# Patient Record
Sex: Female | Born: 1960 | Race: White | Hispanic: No | Marital: Married | Smoking: Never smoker
Health system: Southern US, Community
[De-identification: ages and names within clinical notes are randomized; demographics above are authoritative.]

## PROBLEM LIST (undated history)

## (undated) DIAGNOSIS — Z9889 Other specified postprocedural states: Secondary | ICD-10-CM

## (undated) DIAGNOSIS — K900 Celiac disease: Secondary | ICD-10-CM

## (undated) DIAGNOSIS — Z86711 Personal history of pulmonary embolism: Secondary | ICD-10-CM

## (undated) DIAGNOSIS — D47Z9 Other specified neoplasms of uncertain behavior of lymphoid, hematopoietic and related tissue: Secondary | ICD-10-CM

## (undated) DIAGNOSIS — K635 Polyp of colon: Secondary | ICD-10-CM

## (undated) DIAGNOSIS — K317 Polyp of stomach and duodenum: Secondary | ICD-10-CM

## (undated) DIAGNOSIS — T7840XA Allergy, unspecified, initial encounter: Secondary | ICD-10-CM

## (undated) DIAGNOSIS — J302 Other seasonal allergic rhinitis: Secondary | ICD-10-CM

## (undated) DIAGNOSIS — R0600 Dyspnea, unspecified: Secondary | ICD-10-CM

## (undated) DIAGNOSIS — C439 Malignant melanoma of skin, unspecified: Secondary | ICD-10-CM

## (undated) DIAGNOSIS — Z87442 Personal history of urinary calculi: Secondary | ICD-10-CM

## (undated) DIAGNOSIS — D509 Iron deficiency anemia, unspecified: Secondary | ICD-10-CM

## (undated) DIAGNOSIS — J45909 Unspecified asthma, uncomplicated: Secondary | ICD-10-CM

## (undated) DIAGNOSIS — K219 Gastro-esophageal reflux disease without esophagitis: Secondary | ICD-10-CM

## (undated) DIAGNOSIS — R519 Headache, unspecified: Secondary | ICD-10-CM

## (undated) DIAGNOSIS — I499 Cardiac arrhythmia, unspecified: Secondary | ICD-10-CM

## (undated) DIAGNOSIS — J189 Pneumonia, unspecified organism: Secondary | ICD-10-CM

## (undated) DIAGNOSIS — K828 Other specified diseases of gallbladder: Secondary | ICD-10-CM

## (undated) DIAGNOSIS — M8580 Other specified disorders of bone density and structure, unspecified site: Secondary | ICD-10-CM

## (undated) DIAGNOSIS — H269 Unspecified cataract: Secondary | ICD-10-CM

## (undated) DIAGNOSIS — R112 Nausea with vomiting, unspecified: Secondary | ICD-10-CM

## (undated) HISTORY — DX: Malignant melanoma of skin, unspecified: C43.9

## (undated) HISTORY — DX: Other specified diseases of gallbladder: K82.8

## (undated) HISTORY — PX: OTHER SURGICAL HISTORY: SHX169

## (undated) HISTORY — DX: Iron deficiency anemia, unspecified: D50.9

## (undated) HISTORY — DX: Gastro-esophageal reflux disease without esophagitis: K21.9

## (undated) HISTORY — DX: Polyp of colon: K63.5

## (undated) HISTORY — DX: Polyp of stomach and duodenum: K31.7

## (undated) HISTORY — DX: Other specified neoplasms of uncertain behavior of lymphoid, hematopoietic and related tissue: D47.Z9

## (undated) HISTORY — PX: UMBILICAL HERNIA REPAIR: SHX196

## (undated) HISTORY — PX: CATARACT EXTRACTION, BILATERAL: SHX1313

## (undated) HISTORY — DX: Unspecified cataract: H26.9

## (undated) HISTORY — PX: MOHS SURGERY: SUR867

## (undated) HISTORY — DX: Personal history of pulmonary embolism: Z86.711

## (undated) HISTORY — PX: PELVIC LAPAROSCOPY: SHX162

## (undated) HISTORY — DX: Allergy, unspecified, initial encounter: T78.40XA

## (undated) HISTORY — PX: KNEE SURGERY: SHX244

## (undated) HISTORY — PX: EYE SURGERY: SHX253

## (undated) HISTORY — DX: Other seasonal allergic rhinitis: J30.2

## (undated) HISTORY — DX: Celiac disease: K90.0

## (undated) HISTORY — PX: HAND SURGERY: SHX662

## (undated) HISTORY — DX: Other specified disorders of bone density and structure, unspecified site: M85.80

---

## 1991-02-03 HISTORY — PX: ABDOMINAL SURGERY: SHX537

## 1991-02-03 HISTORY — PX: OVARIAN CYST REMOVAL: SHX89

## 1998-03-20 ENCOUNTER — Other Ambulatory Visit: Admission: RE | Admit: 1998-03-20 | Discharge: 1998-03-20 | Payer: Self-pay | Admitting: Gynecology

## 1999-03-09 ENCOUNTER — Ambulatory Visit (HOSPITAL_COMMUNITY): Admission: RE | Admit: 1999-03-09 | Discharge: 1999-03-09 | Payer: Self-pay | Admitting: Neurosurgery

## 1999-03-09 ENCOUNTER — Encounter: Payer: Self-pay | Admitting: Neurosurgery

## 1999-06-02 ENCOUNTER — Other Ambulatory Visit: Admission: RE | Admit: 1999-06-02 | Discharge: 1999-06-02 | Payer: Self-pay | Admitting: Gynecology

## 1999-12-01 ENCOUNTER — Ambulatory Visit (HOSPITAL_COMMUNITY): Admission: RE | Admit: 1999-12-01 | Discharge: 1999-12-01 | Payer: Self-pay | Admitting: Internal Medicine

## 1999-12-08 ENCOUNTER — Ambulatory Visit (HOSPITAL_COMMUNITY): Admission: RE | Admit: 1999-12-08 | Discharge: 1999-12-08 | Payer: Self-pay | Admitting: Internal Medicine

## 1999-12-29 ENCOUNTER — Encounter: Payer: Self-pay | Admitting: Internal Medicine

## 1999-12-29 ENCOUNTER — Ambulatory Visit (HOSPITAL_COMMUNITY): Admission: RE | Admit: 1999-12-29 | Discharge: 1999-12-29 | Payer: Self-pay | Admitting: Internal Medicine

## 2000-06-08 ENCOUNTER — Other Ambulatory Visit: Admission: RE | Admit: 2000-06-08 | Discharge: 2000-06-08 | Payer: Self-pay | Admitting: Gynecology

## 2001-06-06 ENCOUNTER — Other Ambulatory Visit: Admission: RE | Admit: 2001-06-06 | Discharge: 2001-06-06 | Payer: Self-pay | Admitting: Gynecology

## 2002-07-04 ENCOUNTER — Other Ambulatory Visit: Admission: RE | Admit: 2002-07-04 | Discharge: 2002-07-04 | Payer: Self-pay | Admitting: Gynecology

## 2002-12-04 HISTORY — PX: TUBAL LIGATION: SHX77

## 2002-12-25 ENCOUNTER — Ambulatory Visit (HOSPITAL_BASED_OUTPATIENT_CLINIC_OR_DEPARTMENT_OTHER): Admission: RE | Admit: 2002-12-25 | Discharge: 2002-12-25 | Payer: Self-pay | Admitting: Gynecology

## 2003-07-09 ENCOUNTER — Other Ambulatory Visit: Admission: RE | Admit: 2003-07-09 | Discharge: 2003-07-09 | Payer: Self-pay | Admitting: Gynecology

## 2004-07-10 ENCOUNTER — Other Ambulatory Visit: Admission: RE | Admit: 2004-07-10 | Discharge: 2004-07-10 | Payer: Self-pay | Admitting: Gynecology

## 2005-02-02 HISTORY — PX: CHOLECYSTECTOMY: SHX55

## 2005-07-16 ENCOUNTER — Other Ambulatory Visit: Admission: RE | Admit: 2005-07-16 | Discharge: 2005-07-16 | Payer: Self-pay | Admitting: Gynecology

## 2005-10-12 ENCOUNTER — Ambulatory Visit (HOSPITAL_COMMUNITY): Admission: RE | Admit: 2005-10-12 | Discharge: 2005-10-12 | Payer: Self-pay | Admitting: Gastroenterology

## 2005-12-17 ENCOUNTER — Ambulatory Visit (HOSPITAL_COMMUNITY): Admission: RE | Admit: 2005-12-17 | Discharge: 2005-12-17 | Payer: Self-pay | Admitting: Gastroenterology

## 2006-09-30 ENCOUNTER — Other Ambulatory Visit: Admission: RE | Admit: 2006-09-30 | Discharge: 2006-09-30 | Payer: Self-pay | Admitting: Gynecology

## 2006-12-21 HISTORY — PX: ENDOMETRIAL ABLATION: SHX621

## 2007-08-02 ENCOUNTER — Encounter (INDEPENDENT_AMBULATORY_CARE_PROVIDER_SITE_OTHER): Payer: Self-pay | Admitting: Orthopedic Surgery

## 2007-08-02 ENCOUNTER — Ambulatory Visit (HOSPITAL_BASED_OUTPATIENT_CLINIC_OR_DEPARTMENT_OTHER): Admission: RE | Admit: 2007-08-02 | Discharge: 2007-08-02 | Payer: Self-pay | Admitting: Orthopedic Surgery

## 2008-03-04 ENCOUNTER — Emergency Department (HOSPITAL_COMMUNITY): Admission: EM | Admit: 2008-03-04 | Discharge: 2008-03-04 | Payer: Self-pay | Admitting: Emergency Medicine

## 2008-07-13 ENCOUNTER — Other Ambulatory Visit: Admission: RE | Admit: 2008-07-13 | Discharge: 2008-07-13 | Payer: Self-pay | Admitting: Gynecology

## 2008-07-13 ENCOUNTER — Encounter: Payer: Self-pay | Admitting: Gynecology

## 2008-07-13 ENCOUNTER — Ambulatory Visit: Payer: Self-pay | Admitting: Gynecology

## 2008-09-27 ENCOUNTER — Ambulatory Visit: Payer: Self-pay | Admitting: Gynecology

## 2009-07-15 ENCOUNTER — Other Ambulatory Visit: Admission: RE | Admit: 2009-07-15 | Discharge: 2009-07-15 | Payer: Self-pay | Admitting: Gynecology

## 2009-07-15 ENCOUNTER — Ambulatory Visit: Payer: Self-pay | Admitting: Gynecology

## 2010-06-17 NOTE — Op Note (Signed)
Phyllis Morgan, DAVISSON NO.:  [id number]   MEDICAL RECORD NO.:  [id number]          PATIENT TYPE:  AMB   LOCATION:  [place]                          FACILITY:  [facility]   PHYSICIAN:  Daryll Brod, M.D.       DATE OF BIRTH:  1960-11-16   DATE OF PROCEDURE:  08/02/2007  DATE OF DISCHARGE:                               OPERATIVE REPORT   PREOPERATIVE DIAGNOSES:  Stenosing tenosynovitis, sesamoiditis,  hyperextensibility, metacarpophalangeal joint right thumb.   POSTOPERATIVE DIAGNOSES:  Stenosing tenosynovitis, sesamoiditis,  hyperextensibility, metacarpophalangeal joint right thumb.   OPERATION:  Release A1 pulley, excision sesamoids with plication volar  plate, right thumb.   SURGEON:  Daryll Brod, MD   ANESTHESIA:  Forearm based IV regional.   DATE OF OPERATION:  August 02, 2007.   ANESTHESIOLOGIST:  Soledad Gerlach, MD.   HISTORY:  The patient is a 50 year old female with a history of pain at  the metacarpophalangeal joint of her right thumb.  This has not  responded to conservative treatment.  She has sesamoids with prominence  of the sesamoids both radially and ulnarly with pain on the volar ulnar  aspect.  She is desirous of proceeding to attempt to relief of this  discomfort.  She is aware of risks and complications including  infection, recurrence injury to arteries, nerves, tendons incomplete  release of symptoms, dystrophy.  In the preoperative area, the patient  was seen.  The extremity marked by both the patient and surgeon.  Antibiotics given.  Questions again encouraged and answered.   PROCEDURE:  The patient was brought to the operating room where forearm  based IV regional anesthetic was carried out without difficulty under  the direction of Dr. Ola Spurr.  She was prepped using DuraPrep, supine  position with the right arm free.  A time-out was taken.  A volar  Brunner incision was made, carried down through subcutaneous tissue.  This was apexed  ulnarly.  The flexor tendon, neurovascular bundles  radially and ulnarly were identified and protected.  A very prominent  sesamoid was present on each side.  The A1 pulley was released in its  radial aspect.  The sesamoids were then removed first the radial than  the ulnar.  Following this, the volar plate was found to be quite  stretched.  The areas of the sesamoid excision were then closed with  figure-of-eight 6-0 Mersilene sutures.  The volar plate was plicated  with 6-0 Mersilene sutures.  This maintained the thumb in slight flexion  of approximately 20 degrees prior to plication and closure with the  thumb hyperextended approximately 40 degrees.  The wound was irrigated.  The skin was then closed with interrupted 5-0 Vicryl Rapide sutures.  Sterile compressive dressing and thumb spica splint was applied  including the wrist.  The patient tolerated the procedure well and  deflation of the tourniquet turned the thumb immediately pinked.  The  neurovascular structures were protected throughout the procedure.  She  was taken to the recovery room for observation in satisfactory  condition.  She will be discharged to home to return  to the [facility] in 1 week on Vicodin.           ______________________________  Daryll Brod, M.D.     GK/MEDQ  D:  08/02/2007  T:  08/03/2007  Job:  [id number]   cc:   Si Raider, MD.

## 2010-06-20 NOTE — Op Note (Signed)
NAME:  Phyllis Morgan, Phyllis Morgan               ACCOUNT NO.:  [id number]   MEDICAL RECORD NO.:  [id number]          PATIENT TYPE:  AMB   LOCATION:  [facility]                         FACILITY:  [facility]   PHYSICIAN:  Nelwyn Salisbury, M.D.  DATE OF BIRTH:  [birth year]   DATE OF PROCEDURE:  10/12/2005  DATE OF DISCHARGE:                                 OPERATIVE REPORT   PROCEDURE PERFORMED:  Esophagogastroduodenoscopy.   ENDOSCOPIST:  Nelwyn Salisbury, M.D.   INSTRUMENT USED:  Olympus video panendoscope.   INDICATIONS FOR PROCEDURE:  This is a 50 year old white female with a  history of right upper quadrant epigastric pain with reflux, undergoing an  EGD; rule out peptic ulcer disease, esophagitis, gastritis, etc.  The  patient had an ultrasound and HIDA scan in the past and has been  unrevealing.   PREPROCEDURE PREPARATION:  Informed consent was procured from the patient.  The patient was fasted for 4 hours prior to the procedure.  Risks and  benefits of the procedure were discussed with her in great detail.   PREPROCEDURE PHYSICAL:  VITAL SIGNS:  The patient had stable vital signs.  NECK:  Supple.  CHEST:  Clear to auscultation.  CARDIAC:  S1 and S2 regular.  ABDOMEN:  Soft with normal bowel sounds.   DESCRIPTION OF THE PROCEDURE:  The patient was placed in the left lateral  decubitus position and sedated with 75 mcg of fentanyl and 7.5 mg of Versed  given intravenously in slow incremental doses.  Once the patient was  adequately sedated and maintained on low-flow oxygen and continuous cardiac  monitoring, the Olympus video panendoscope was advanced through the  mouthpiece, over the tongue and into the esophagus under direct vision.  The  entire esophagus appeared normal with no evidence of ring, stricture,  masses, esophagitis or Barrett's mucosa.  The scope was then advanced into  the stomach; the entire gastric mucosa and the proximal small bowel appeared  normal.   IMPRESSION:  Normal  esophagogastroduodenoscopy.   RECOMMENDATIONS:  1. Continue Nexium for now.  2. Abdominal ultrasound and HIDA scan will be planned if the patient      continues to have right upper quadrant      pain after meals or worse with greasy food.  3. Outpatient followup in the next 2 weeks or as need arises in the      future.  The patient claims she has not had an severe attack in      several months now.  She is to contact the office as need arises in the      future.      Nelwyn Salisbury, M.D.  Electronically Signed     JNM/MEDQ  D:  10/12/2005  T:  10/13/2005  Job:  [id number]   cc:   Maureen Chatters MD

## 2010-06-20 NOTE — Op Note (Signed)
NAME:  Phyllis Morgan, Phyllis Morgan                         ACCOUNT NO.:  [id number]   MEDICAL RECORD NO.:  [id number]                   PATIENT TYPE:  AMB   LOCATION:  [facility]                                 FACILITY[facility]   PHYSICIAN:  Timothy P. Fontaine, M.D.           DATE OF BIRTH:  [birth year]   DATE OF PROCEDURE:  DATE OF DISCHARGE:                                 OPERATIVE REPORT   PREOPERATIVE DIAGNOSIS:  Desires permanent sterilization.   POSTOPERATIVE DIAGNOSIS:  Desires permanent sterilization.   OPERATION/PROCEDURE:  Laparoscopic bilateral tubal sterilization, Falope  ring technique.   SURGEON:  Timothy P. Fontaine, M.D.   ANESTHESIA:  General endotracheal anesthesia.   ESTIMATED BLOOD LOSS:  Minimal.   COMPLICATIONS:  None.   SPECIMENS:  None.   FINDINGS:  Anterior cul-de-sac normal.  Posterior cul-de-sac normal.  Uterus  normal size, shape and contour.  Right and left fallopian tubes normal  length, caliber and fimbriated ends.  Single Falope ring applied to each  side.  Right and left ovaries grossly normal.  No evidence of pelvic  endometriosis or adhesive disease.  Upper abdominal exam is normal noting  normal appendix, normal liver, normal gallbladder, limited inspection.   DESCRIPTION OF PROCEDURE:  The patient was taken to the operating room,  underwent general endotracheal anesthesia and placed in the low dorsal  lithotomy position, received an abdominal, perineal and vaginal preparation  with Betadine solution.  Bladder empty with Foley catheterization.  EUA  performed and a Hulka tenaculum placed on the cervix.  The patient was  draped in the usual fashion.  A vertical infraumbilical incision was made,  the Veress needle placed and its position verified with water and 2 L of CO2  gas was infused without difficulty.  The 10 mm laparoscopic trocar was then  placed without difficulty, its position verified visually.  A midline  suprapubic port was then placed with  the Falope ring applier without  difficulty under direct visualization after transillumination per the  vessels.  Examination of the pelvic organs and upper abdominal exam was  carried with findings noted above.  The right fallopian tube was then  identified, traced from its insertion to its fimbriated end.  A mid tubal  segment was grasped, delivered into the Falope ring applying sheath and a  single Falope ring was applied.  There was a good knuckle of tube within the  ring and approximately blanching occurred afterwards.  Manipulation  following assured secure placement.  A similar procedure was carried out on  the other side.  The suprapubic port was then removed, the gas allowed to  escape, port site inspected under a low pressure situation and the  infraumbilical port was then backed out under direct visualization showing  adequate hemostasis.  No evidence of hernia formation.  A 0 Vicryl  interrupted subcutaneous fascial stitch was placed infraumbilically.  Both  skin ports injected with 0.25% Marcaine  and  both skin incisions closed using Dermabond skin adhesive.  The Hulka  tenaculum was removed.  The patient was placed in the supine position,  awakened without difficulty and taken to the recovery room in good condition  having tolerated the procedure well.                                               Timothy P. Phineas Real, M.D.    TPF/MEDQ  D:  12/25/2002  T:  12/25/2002  Job:  [id number]

## 2010-06-20 NOTE — H&P (Signed)
NAME:  Phyllis Morgan, Phyllis Morgan                         ACCOUNT NO.:  [id number]   MEDICAL RECORD NO.:  [id number]                   PATIENT TYPE:  AMB   LOCATION:  [facility]                                 FACILITY:  [facility]   PHYSICIAN:  Timothy P. Fontaine, M.D.           DATE OF BIRTH:  08/23/1960   DATE OF ADMISSION:  12/25/2002  DATE OF DISCHARGE:                                HISTORY & PHYSICAL   CHIEF COMPLAINT:  Sterilization.   HISTORY OF PRESENT ILLNESS:  A 50 year old G22, P18, female on oral  contraceptives who desires permanent sterilization after counseling for  alternatives.   PAST MEDICAL HISTORY:  Gastroesophageal reflux disease.   PAST SURGICAL HISTORY:  1. Herniated navel repair.  2. Joint removal from thumb.  3. Cesarean section.  4. Ovarian cyst dermoid removal.   CURRENT MEDICATIONS:  1. Protonix.  2. Allegra.  3. Mucinex.  4. Sudafed.  5. Lo-Ovral.   ALLERGIES:  SULFA DRUGS.   REVIEW OF SYSTEMS:  Noncontributory.   SOCIAL HISTORY:  Noncontributory.   REVIEW OF SYSTEMS:  Noncontributory.   PHYSICAL EXAMINATION:  VITAL SIGNS:  Afebrile.  Vital signs are stable.  HEENT:  Normal.  LUNGS:  Clear.  CARDIAC:  Regular rate, no murmurs, rubs, or gallops.  ABDOMEN:  Benign.  PELVIC:  External BUS, vagina normal.  Cervix normal.  Uterus normal size,  nontender.  Adnexa without masses or tenderness.   ASSESSMENT:  A 50 year old G11, P40 female on oral contraceptives who desires  permanent sterilization.  I reviewed alternative options with her, including  reversible and non-reversible options, and she wants to proceed with  sterilization.  I reviewed which involved with laparoscopic tubal  sterilizations, including instrumentation, trocar placement, insufflation,  use of Falope rings, and electrocautery backup.  The permanency of the  procedure was discussed and stressed, as well as the risks of failure and  she understands and accepts this.  The acute intraoperative  risks of the  surgery were reviewed to include internal organ damage, including bowel,  bladder, ureter, vessels, and nerves, necessitating major exploratory  reparative surgeries and future reparative surgeries, including ostomy  formation.  She does have a history of an umbilical hernia repair as a  child, and I discussed the issues of scarring and increased risk for  operative injury was discussed with her.  The risks of major vessel injury  leading to hemorrhage necessitating transfusion was discussed, and the risks  of transfusion reaction, hepatitis, human immunodeficiency virus, Mad Cow  disease, and other unknown entities were reviewed.  The risk of infection,  both internal, requiring prolonged antibiotics, re-operation for abscess  formation, as well as incisional complications requiring opening and  draining of incisions, was all discussed with her.  The patient's questions  were answered to her satisfaction and she is ready to proceed with surgery.  Timothy P. Phineas Real, M.D.    TPF/MEDQ  D:  12/20/2002  T:  12/20/2002  Job:  [id number]

## 2010-06-20 NOTE — Procedures (Signed)
[facility]  Patient:    Phyllis Morgan, Phyllis Morgan                      MRN: [id number] Proc. Date: 11/07/99 Adm. Date:  [id number] Disc. Date: [id number] Attending:  Neita Garnet                           Procedure Report  PROCEDURE PERFORMED:  Esophageal manometry.  INDICATION:  Abdominal and chest pain.  HISTORY: This is a 50 year old female recently evaluated for chest pain.  As part of that workup she is undergoing esophageal manometry.  The nature of the procedure as well as the risks, benefits and alternatives have been reviewed. She agreed to proceed.  DESCRIPTION OF PROCEDURE:  Esophageal manometry was performed in the standard manner.  The exam was uneventful.  Data adequate, and the patients tolerance was excellent.  FINDINGS: 1. The upper esophageal sphincter revealed normal coordination and relaxation. 2. The esophageal body demonstrated normal wave amplitude and normal wave    propagation.  Thus, normal peristalsis. 3. Lower esophageal sphincter resting pressure was normal at 18.6 mmHg.    In addition, normal and complete relaxation with swallowing was    demonstrated.  IMPRESSION:  Normal esophageal manometry.  RECOMMENDATIONS:  Office follow up as planned.DD:  12/10/99 TD:  12/11/99 Job: [id number]

## 2010-08-07 ENCOUNTER — Other Ambulatory Visit: Payer: Self-pay | Admitting: Gynecology

## 2010-08-07 ENCOUNTER — Other Ambulatory Visit (HOSPITAL_COMMUNITY)
Admission: RE | Admit: 2010-08-07 | Discharge: 2010-08-07 | Disposition: A | Discharge status: Home / Self Care | Payer: BC Managed Care – PPO | Source: Ambulatory Visit | Attending: Gynecology | Admitting: Gynecology

## 2010-08-07 ENCOUNTER — Encounter (INDEPENDENT_AMBULATORY_CARE_PROVIDER_SITE_OTHER): Payer: BC Managed Care – PPO | Admitting: Gynecology

## 2010-08-07 DIAGNOSIS — Z13220 Encounter for screening for lipoid disorders: Secondary | ICD-10-CM

## 2010-08-07 DIAGNOSIS — N926 Irregular menstruation, unspecified: Secondary | ICD-10-CM

## 2010-08-07 DIAGNOSIS — Z01419 Encounter for gynecological examination (general) (routine) without abnormal findings: Secondary | ICD-10-CM

## 2010-08-07 DIAGNOSIS — Z833 Family history of diabetes mellitus: Secondary | ICD-10-CM

## 2010-10-02 ENCOUNTER — Ambulatory Visit (INDEPENDENT_AMBULATORY_CARE_PROVIDER_SITE_OTHER): Payer: BC Managed Care – PPO | Admitting: Gynecology

## 2010-10-02 ENCOUNTER — Encounter: Payer: Self-pay | Admitting: Gynecology

## 2010-10-02 VITALS — Temp 98.2°F

## 2010-10-02 DIAGNOSIS — B373 Candidiasis of vulva and vagina: Secondary | ICD-10-CM

## 2010-10-02 DIAGNOSIS — R350 Frequency of micturition: Secondary | ICD-10-CM

## 2010-10-02 DIAGNOSIS — N898 Other specified noninflammatory disorders of vagina: Secondary | ICD-10-CM

## 2010-10-02 DIAGNOSIS — R300 Dysuria: Secondary | ICD-10-CM

## 2010-10-02 MED ORDER — FLUCONAZOLE 150 MG PO TABS: 150.0000 mg | ORAL_TABLET | Freq: Once | ORAL | Status: AC

## 2010-10-02 MED ORDER — NITROFURANTOIN MONOHYD MACRO 100 MG PO CAPS: 100.0000 mg | ORAL_CAPSULE | Freq: Two times a day (BID) | ORAL | Status: AC

## 2010-10-02 NOTE — Progress Notes (Signed)
Patient presents with a one week history of frequency and dysuria mild suprapubic discomfort and just not feeling good. She feels that his low-grade fever. No history of this before.  Exam Spine: Straight without CVA tenderness Abdomen: Soft mild suprapubic tenderness no masses guarding rebound organomegaly. Pelvic: External BUS vagina White discharge KOH wet prep done, cervix normal, uterus normal size midline mobile nontender adnexa without masses or tenderness  Patient is afebrile, UA is negative, KOH wet prep positive for yeast. We'll treat with Diflucan 150x1 dose. Given the patient's symptoms regardless of negative you've a would treat her as a UTI cystitis with Macrobid 100 twice a day x7 days.  If symptoms persist she'll call me and I'll refer to urology. I discussed possibility such as interstitial cystitis. Otherwise assuming her symptoms cleared and she'll follow up routinely

## 2010-10-07 ENCOUNTER — Encounter: Payer: Self-pay | Admitting: Gynecology

## 2010-10-09 ENCOUNTER — Encounter: Payer: Self-pay | Admitting: Gynecology

## 2010-10-10 ENCOUNTER — Encounter: Payer: Self-pay | Admitting: Gynecology

## 2010-10-30 LAB — POCT HEMOGLOBIN-HEMACUE: Hemoglobin: 12.1

## 2011-02-03 DIAGNOSIS — K900 Celiac disease: Secondary | ICD-10-CM

## 2011-02-03 HISTORY — DX: Celiac disease: K90.0

## 2011-04-13 ENCOUNTER — Ambulatory Visit (INDEPENDENT_AMBULATORY_CARE_PROVIDER_SITE_OTHER): Payer: BC Managed Care – PPO | Admitting: Women's Health

## 2011-04-13 ENCOUNTER — Encounter: Payer: Self-pay | Admitting: Women's Health

## 2011-04-13 ENCOUNTER — Other Ambulatory Visit: Payer: Self-pay | Admitting: Women's Health

## 2011-04-13 VITALS — Temp 96.9°F

## 2011-04-13 DIAGNOSIS — R300 Dysuria: Secondary | ICD-10-CM

## 2011-04-13 LAB — URINALYSIS W MICROSCOPIC + REFLEX CULTURE
Hgb urine dipstick: NEGATIVE
Nitrite: NEGATIVE
Protein, ur: NEGATIVE mg/dL
pH: 6 (ref 5.0–8.0)

## 2011-04-13 NOTE — Patient Instructions (Signed)
Postmenopausal Bleeding Menopause is when a woman's period stops. It is also not having periods for a total of 12 months. Postmenopausal bleeding is any bleeding after menopause. Any type of bleeding after menopause is concerning. Talk to your doctor about this. You may need medicine, hormones, surgery, or a procedure to remove tumors or growths. HOME CARE  Stay at a healthy weight.   Keep regular pelvic exams and Pap tests.  GET HELP RIGHT AWAY IF:   You have a fever.   You have chills, a headache, dizziness, muscle aches, and bleeding.   You have pain with bleeding.   You have clumps of blood (blood clots) coming from your vagina.   You have bleeding and need more than 1 pad an hour.   You feel like you are going to pass out (faint).   You have more bleeding than when you were having periods.   Your bleeding lasts for more than 1 week.   You have belly (abdominal) pain.   You have bleeding after sex (intercourse).  MAKE SURE YOU:  Understand these instructions.   Will watch your condition.   Will get help right away if you are not doing well or get worse.  Document Released: 10/29/2007 Document Revised: 01/08/2011 Document Reviewed: 09/25/2010 [facility] Patient Information 2012 [place].

## 2011-04-13 NOTE — Progress Notes (Signed)
Patient ID: Phyllis Morgan, female   DOB: 12-24-1960, 51 y.o.   MRN: [id number] Presented with the complaint of  burning with urination for one day. Denies any pain at end of stream, fever, or vaginal discharge. Also states has had increased hot flushes, poor sleep, vaginal dryness, and no bleeding for several months. Had cryoablation in 08, [facility] 43 July 2012.  UA trace leukocytes was here to 2 WBCs.  Menopausal symptoms  Plan: Options reviewed for menopausal treatments. Reviewed black cohosh, vitamin E, HRT, reviewed importance of exercise. Reviewed pros and cons for all, would like to try with vitamin  E. Will call back if no relief of symptoms. Encouraged vaginal lubricants with intercourse. Urine culture pending.

## 2011-04-16 LAB — URINE CULTURE: Colony Count: 10000

## 2011-04-27 ENCOUNTER — Telehealth: Payer: Self-pay | Admitting: *Deleted

## 2011-04-27 NOTE — Telephone Encounter (Signed)
Patient saw [place] while you were out for UTI and hormone issues.  Harriett Sine discussed some options with her but she is very confused on what is best for her and would like to speak to you about what is best for her.  Did offer her an appt but she said she can't get off for another 2 weeks or so to come in and had already took 3 hours off for this last visit.  Would there be any way you could talk to her on the phone about this?

## 2011-04-28 ENCOUNTER — Telehealth: Payer: Self-pay | Admitting: Gynecology

## 2011-04-28 NOTE — Telephone Encounter (Signed)
Please get the patient on the phone for me.

## 2011-04-28 NOTE — Telephone Encounter (Signed)
Patient calls in follow up from a visit with Phyllis Morgan to discuss her symptoms of hot flashes, night sweats, mood swings. Her last period was November having had one previously 6 months before.  She had an elevated [facility] July 2000 1242. I reviewed options for menopausal symptoms with her to include nonpharmacologic such as soy, nonhormonal pharmacologic such as Effexor and HRT. I reviewed the WHI study with her risks benefits of HRT to include stroke heart attack DVT and breast cancer risk. Transdermal versus oral first pass effect issues reviewed. Patient wants to try soy first if this does not help then she will call and initiate HRT which I will tentatively plan minivelle 0.05 patch/Prometrium 100 mg nightly. But we will hold on this at present and see how she responds to soy.

## 2011-04-28 NOTE — Telephone Encounter (Signed)
done

## 2011-08-24 ENCOUNTER — Ambulatory Visit (INDEPENDENT_AMBULATORY_CARE_PROVIDER_SITE_OTHER): Payer: BC Managed Care – PPO | Admitting: Gynecology

## 2011-08-24 ENCOUNTER — Encounter: Payer: Self-pay | Admitting: Gynecology

## 2011-08-24 VITALS — BP 110/66 | Ht 64.0 in | Wt 156.0 lb

## 2011-08-24 DIAGNOSIS — M8580 Other specified disorders of bone density and structure, unspecified site: Secondary | ICD-10-CM

## 2011-08-24 DIAGNOSIS — L293 Anogenital pruritus, unspecified: Secondary | ICD-10-CM

## 2011-08-24 DIAGNOSIS — N898 Other specified noninflammatory disorders of vagina: Secondary | ICD-10-CM

## 2011-08-24 DIAGNOSIS — M899 Disorder of bone, unspecified: Secondary | ICD-10-CM

## 2011-08-24 DIAGNOSIS — Z13220 Encounter for screening for lipoid disorders: Secondary | ICD-10-CM

## 2011-08-24 DIAGNOSIS — Z01419 Encounter for gynecological examination (general) (routine) without abnormal findings: Secondary | ICD-10-CM

## 2011-08-24 DIAGNOSIS — Z131 Encounter for screening for diabetes mellitus: Secondary | ICD-10-CM

## 2011-08-24 DIAGNOSIS — J302 Other seasonal allergic rhinitis: Secondary | ICD-10-CM | POA: Insufficient documentation

## 2011-08-24 DIAGNOSIS — E559 Vitamin D deficiency, unspecified: Secondary | ICD-10-CM

## 2011-08-24 DIAGNOSIS — N83209 Unspecified ovarian cyst, unspecified side: Secondary | ICD-10-CM | POA: Insufficient documentation

## 2011-08-24 LAB — CBC WITH DIFFERENTIAL/PLATELET
Basophils Relative: 1 % (ref 0–1)
Eosinophils Absolute: 0.1 10*3/uL (ref 0.0–0.7)
MCH: 28.5 pg (ref 26.0–34.0)
MCHC: 33.5 g/dL (ref 30.0–36.0)
Neutrophils Relative %: 67 % (ref 43–77)
Platelets: 228 10*3/uL (ref 150–400)
RDW: 14.8 % (ref 11.5–15.5)

## 2011-08-24 LAB — WET PREP FOR TRICH, YEAST, CLUE
Clue Cells Wet Prep HPF POC: NONE SEEN
Trich, Wet Prep: NONE SEEN

## 2011-08-24 MED ORDER — METRONIDAZOLE 500 MG PO TABS: 500.0000 mg | ORAL_TABLET | Freq: Two times a day (BID) | ORAL | Status: AC

## 2011-08-24 NOTE — Progress Notes (Signed)
Phyllis Morgan 13-Jun-1960 [id number]        51 y.o.  G3P1021 for annual exam.  Doing well with several issues noted below.  Past medical history,surgical history, medications, allergies, family history and social history were all reviewed and documented in the EPIC chart. ROS:  Was performed and pertinent positives and negatives are included in the history.  Exam: Kim assistant Filed Vitals:   08/24/11 1445  BP: 110/66  Height: 5\' 4"  (1.626 m)  Weight: 156 lb (70.761 kg)   General appearance  Normal Skin grossly normal Head/Neck normal with no cervical or supraclavicular adenopathy thyroid normal Lungs  clear Cardiac RR, without RMG Abdominal  soft, nontender, without masses, organomegaly or hernia Breasts  examined lying and sitting without masses, retractions, discharge or axillary adenopathy. Pelvic  Ext/BUS/vagina  normal with slight white discharge  Cervix  normal   Uterus  anteverted, normal size, shape and contour, midline and mobile nontender   Adnexa  Without masses or tenderness    Anus and perineum  normal   Rectovaginal  normal sphincter tone without palpated masses or tenderness.    Assessment/Plan:  51 y.o. [id number] female for annual exam, laparoscopic tubal sterilization.   1. Irregular menses. Patient's menses continue irregular we she will skip months at a time. Not having any significant hot flashes or sweats. She did have an elevated FSH last year. She will keep a menstrual calendar and as long as less frequent but regular menses we'll follow. If she has prolonged or atypical bleeding she knows to call for further evaluation. 2. Vaginal discharge. Patient has history of slight vaginal discharge with itching for 2 weeks. 3. Osteopenia. DEXA 10/2010 showed T score -1.4 with FRAX 3.9%/0.3%. Check vitamin D level increased calcium reviewed. We'll repeat at a five-year interval. 4. Mammography.  Patient due for mammogram in September and I reminded her to schedule  this. SBE monthly reviewed. 5. Pap smear. No Pap smear done today. Last Pap smear 2012. No history of significant abnormalities in the past with multiple normal records in her chart. We'll plan every 3 year to 5 your screen per current screening guidelines. 6. Health maintenance. Baseline CBC lipid profile glucose urinalysis ordered. Follow up in one year, sooner as needed.    Dara Lords MD, 3:16 PM 08/24/2011

## 2011-08-24 NOTE — Patient Instructions (Signed)
Follow up in 1 year for annual gynecologic exam. Keep menstrual calendar. As long as less frequent but regular menses monitor. If prolonged or atypical bleeding or menopausal symptoms such as hot flashes or night sweats become worse than follow up for further evaluation and discussion of treatment options

## 2011-08-25 ENCOUNTER — Other Ambulatory Visit: Payer: Self-pay | Admitting: Gynecology

## 2011-08-25 DIAGNOSIS — D649 Anemia, unspecified: Secondary | ICD-10-CM

## 2011-08-25 LAB — URINALYSIS W MICROSCOPIC + REFLEX CULTURE
Glucose, UA: NEGATIVE mg/dL
Leukocytes, UA: NEGATIVE
Protein, ur: NEGATIVE mg/dL

## 2011-08-25 LAB — LIPID PANEL: Total CHOL/HDL Ratio: 4.3 Ratio

## 2011-08-25 LAB — GLUCOSE, RANDOM: Glucose, Bld: 88 mg/dL (ref 70–99)

## 2011-10-12 ENCOUNTER — Telehealth: Payer: Self-pay | Admitting: *Deleted

## 2011-10-12 DIAGNOSIS — N926 Irregular menstruation, unspecified: Secondary | ICD-10-CM

## 2011-10-12 NOTE — Telephone Encounter (Signed)
Recommend patient follow up for sonohysterogram. Order was placed for her to schedule.

## 2011-10-12 NOTE — Telephone Encounter (Signed)
Pt is calling to follow up with you about her cycle. Pt was seen for annual back in July and told to keep a menstrual calender, pt said she can't remember LMP. She has had frequent spotting for the last several weeks, pt said it would be off & on only. No flow, she asked what you would like for her to do? Please advise

## 2011-10-13 NOTE — Telephone Encounter (Signed)
Pt informed with the below note, transferred to appointment desk 

## 2011-10-14 ENCOUNTER — Ambulatory Visit (INDEPENDENT_AMBULATORY_CARE_PROVIDER_SITE_OTHER): Payer: BC Managed Care – PPO

## 2011-10-14 ENCOUNTER — Encounter: Payer: Self-pay | Admitting: Gynecology

## 2011-10-14 ENCOUNTER — Ambulatory Visit (INDEPENDENT_AMBULATORY_CARE_PROVIDER_SITE_OTHER): Payer: BC Managed Care – PPO | Admitting: Gynecology

## 2011-10-14 ENCOUNTER — Other Ambulatory Visit: Payer: Self-pay | Admitting: Gynecology

## 2011-10-14 DIAGNOSIS — N921 Excessive and frequent menstruation with irregular cycle: Secondary | ICD-10-CM

## 2011-10-14 DIAGNOSIS — N946 Dysmenorrhea, unspecified: Secondary | ICD-10-CM

## 2011-10-14 DIAGNOSIS — N926 Irregular menstruation, unspecified: Secondary | ICD-10-CM

## 2011-10-14 DIAGNOSIS — N83339 Acquired atrophy of ovary and fallopian tube, unspecified side: Secondary | ICD-10-CM

## 2011-10-14 LAB — TSH: TSH: 1.263 u[IU]/mL (ref 0.350–4.500)

## 2011-10-14 MED ORDER — MEGESTROL ACETATE 20 MG PO TABS: 20.0000 mg | ORAL_TABLET | Freq: Every day | ORAL | Status: AC

## 2011-10-14 NOTE — Progress Notes (Signed)
Patient presents for sonohysterogram due to irregular bleeding. She had an elevated FSH at 45 last year had sporadic menses but has been spotting over the last 2 weeks continuously. No real significant hot flushes or night sweats. Did previously have worse symptoms but these seem to have gotten better.  Recently had a normal annual exam in July 2013.  Ultrasound shows normal uterus in size and echotexture. Endometrial echo 1.4 mm. Right and left ovaries atrophic in appearance and normal. Sonohysterogram was performed, sterile technique catheter introduced approximately mid to lower uterine segment without ability for further advancement despite the use of a tenaculum. There was limited distention with evidence of scarring and restricted distention. No overt abnormalities visualized. Endometrial sample taken. Patient tolerated well.  Assessment and plan: Irregular bleeding, status post endometrial ablation. I suspect her bleeding is perimenopausal. We'll recheck her [place] and TSH today. She'll follow up for the endometrial sample although I anticipate this probably will be inadequate and I discussed this with her.  We'll treat with Megace 20 mg for 10 days now. I reviewed with her that if her abnormal bleeding would persist and were having difficulty evaluating the cavity that we may need to proceed with a more aggressive approach such as hysteroscopy D&C or hysterectomy. Patient will follow up for biopsy results and lab work at this point we'll least monitor her.

## 2011-10-14 NOTE — Patient Instructions (Addendum)
Office will call you with the biopsy results and lab work. We'll plan on observation at present but if irregular bleeding continues call the office.

## 2011-10-15 LAB — FOLLICLE STIMULATING HORMONE: FSH: 26.6 m[IU]/mL

## 2011-10-16 ENCOUNTER — Encounter: Payer: Self-pay | Admitting: *Deleted

## 2011-10-16 NOTE — Progress Notes (Unsigned)
Patient ID: Phyllis Morgan, female   DOB: Aug 09, 1960, 51 y.o.   MRN: [id number] Misty from [facility] called to verify  [place] site on 10/14/11, informed Endometrial sample taken.

## 2011-10-19 ENCOUNTER — Telehealth: Payer: Self-pay | Admitting: *Deleted

## 2011-10-19 NOTE — Telephone Encounter (Signed)
Pt informed with recent lab results 10/14/11.

## 2011-11-11 ENCOUNTER — Telehealth: Payer: Self-pay | Admitting: *Deleted

## 2011-11-11 NOTE — Telephone Encounter (Signed)
Left message on pt voicemail lab results from July 2013. Per pt request.

## 2011-12-03 ENCOUNTER — Encounter: Payer: Self-pay | Admitting: Gynecology

## 2011-12-03 ENCOUNTER — Other Ambulatory Visit: Payer: BC Managed Care – PPO

## 2011-12-03 ENCOUNTER — Other Ambulatory Visit: Payer: Self-pay | Admitting: *Deleted

## 2011-12-03 DIAGNOSIS — K219 Gastro-esophageal reflux disease without esophagitis: Secondary | ICD-10-CM

## 2011-12-03 DIAGNOSIS — D649 Anemia, unspecified: Secondary | ICD-10-CM

## 2011-12-03 LAB — CBC WITH DIFFERENTIAL/PLATELET
Hemoglobin: 12.7 g/dL (ref 12.0–15.0)
Lymphocytes Relative: 28 % (ref 12–46)
Lymphs Abs: 1.7 10*3/uL (ref 0.7–4.0)
MCH: 30 pg (ref 26.0–34.0)
Monocytes Relative: 9 % (ref 3–12)
Neutro Abs: 3.8 10*3/uL (ref 1.7–7.7)
Neutrophils Relative %: 60 % (ref 43–77)
RBC: 4.24 MIL/uL (ref 3.87–5.11)
WBC: 6.1 10*3/uL (ref 4.0–10.5)

## 2011-12-04 LAB — GLIADIN ANTIBODIES, SERUM
Gliadin IgA: 84.4 U/mL — ABNORMAL HIGH (ref <20)
Gliadin IgG: 37.2 U/mL — ABNORMAL HIGH (ref <20)

## 2011-12-04 LAB — TISSUE TRANSGLUTAMINASE, IGA: Tissue Transglutaminase Ab, IgA: 32.7 U/mL — ABNORMAL HIGH (ref <20)

## 2011-12-08 LAB — RETICULIN ANTIBODIES, IGA W TITER: Reticulin Ab, IgA: NEGATIVE

## 2011-12-14 ENCOUNTER — Encounter: Payer: Self-pay | Admitting: Gynecology

## 2011-12-15 ENCOUNTER — Telehealth: Payer: Self-pay | Admitting: *Deleted

## 2011-12-15 NOTE — Telephone Encounter (Signed)
Pt sent in an email. That states is having hot flashes 20 - 24 a day. And night they are disturbing her sleep a lot. When this happened before she took estroven and it helped. This time she has been taking it for 2 weeks without relief and had heard of antidepressants helping. I advised OV to discuss this since it would be a new medication for her and she asked if you could call her. [phone]. PLease advise

## 2011-12-15 NOTE — Telephone Encounter (Signed)
Pt informed KW 

## 2011-12-15 NOTE — Telephone Encounter (Signed)
This is an office visit discussion.

## 2012-02-02 ENCOUNTER — Telehealth: Payer: Self-pay | Admitting: *Deleted

## 2012-02-02 NOTE — Telephone Encounter (Signed)
Left the below on pt voicemail. 

## 2012-02-02 NOTE — Telephone Encounter (Signed)
Pt has been diagnosed with celiac disease, Dr.Mann is her GI MD and you brought this to pt attention when labs were drawn on 12/03/11. Pt asked if you thought any other labs should be drawn such Vit. D and Vit. b12 etc? Pt asked this because she will start on a glutin free diet soon and would like to make sure labs are okay. She will have f/u visit with Dr.Mann in March 2014. Please advise

## 2012-02-02 NOTE — Telephone Encounter (Signed)
I do not think additional labs are required. She should take a multivitamin daily and that would help supplement any deficiencies.

## 2012-02-15 ENCOUNTER — Encounter: Payer: Self-pay | Admitting: Gynecology

## 2012-03-11 ENCOUNTER — Ambulatory Visit (INDEPENDENT_AMBULATORY_CARE_PROVIDER_SITE_OTHER): Payer: BC Managed Care – PPO | Admitting: Gynecology

## 2012-03-11 ENCOUNTER — Encounter: Payer: Self-pay | Admitting: Gynecology

## 2012-03-11 ENCOUNTER — Other Ambulatory Visit: Payer: Self-pay | Admitting: *Deleted

## 2012-03-11 DIAGNOSIS — N951 Menopausal and female climacteric states: Secondary | ICD-10-CM

## 2012-03-11 DIAGNOSIS — N926 Irregular menstruation, unspecified: Secondary | ICD-10-CM

## 2012-03-11 MED ORDER — ESTRADIOL 0.1 MG/24HR TD PTTW: 1.0000 | MEDICATED_PATCH | TRANSDERMAL | Status: DC

## 2012-03-11 MED ORDER — PROGESTERONE MICRONIZED 100 MG PO CAPS: 100.0000 mg | ORAL_CAPSULE | Freq: Every day | ORAL | Status: DC

## 2012-03-11 NOTE — Progress Notes (Signed)
Patient presents to discuss menopausal symptoms.  Was treated with 10 days of Megace due to. Her bleeding in September and has not had any bleeding since then.  FSH at that time was 26. Since then she has progressively gotten worse hot flushes and night sweats. Occurs throughout the day and becoming more bothersome to her. She has tried OTC soy based products which initially helped but now they're no longer helping. I reviewed options with her to include non-hormonal pharmacologic such as Effexor or Brisdell and HRT. I reviewed the WHI study and the risks to include stroke heart attack DVT of breast cancer.  ACOG and NAMS statements her lowest is the shortest period of time. Transdermal versus oral, first pass effect issues also discussed.  Patient wants to try and I have recommended we start with MiniVivelle 0.1 mg patches and Prometrium 100 mg nightly. Patient will go ahead and initiate. She'll monitor for any bleeding and follow up with me in July when she is due for her annual or sooner if she has any issues or questions once initiating HRT.

## 2012-03-11 NOTE — Patient Instructions (Signed)
Start on the estrogen patch 2 times weekly. Take the progesterone pill at bedtime nightly. Call me if you have any questions or issues with the medications. Otherwise assuming that you get good relief of your hot flashes, follow up with me in July when you're due for your annual exam.

## 2012-03-11 NOTE — Telephone Encounter (Signed)
Generic patch Rx was sent. Needed branded Minivelle in order to use the coupon card for [place]

## 2012-03-19 ENCOUNTER — Other Ambulatory Visit: Payer: Self-pay

## 2012-09-23 ENCOUNTER — Encounter: Payer: Self-pay | Admitting: Gynecology

## 2012-09-23 ENCOUNTER — Ambulatory Visit (INDEPENDENT_AMBULATORY_CARE_PROVIDER_SITE_OTHER): Payer: BC Managed Care – PPO | Admitting: Gynecology

## 2012-09-23 VITALS — BP 114/76 | Ht 65.0 in | Wt 154.0 lb

## 2012-09-23 DIAGNOSIS — Z01419 Encounter for gynecological examination (general) (routine) without abnormal findings: Secondary | ICD-10-CM

## 2012-09-23 DIAGNOSIS — Z13220 Encounter for screening for lipoid disorders: Secondary | ICD-10-CM

## 2012-09-23 DIAGNOSIS — K900 Celiac disease: Secondary | ICD-10-CM

## 2012-09-23 DIAGNOSIS — Z79890 Hormone replacement therapy (postmenopausal): Secondary | ICD-10-CM

## 2012-09-23 DIAGNOSIS — M899 Disorder of bone, unspecified: Secondary | ICD-10-CM

## 2012-09-23 DIAGNOSIS — M8580 Other specified disorders of bone density and structure, unspecified site: Secondary | ICD-10-CM

## 2012-09-23 LAB — CBC WITH DIFFERENTIAL/PLATELET
Basophils Absolute: 0 10*3/uL (ref 0.0–0.1)
HCT: 39.3 % (ref 36.0–46.0)
Lymphocytes Relative: 19 % (ref 12–46)
Neutro Abs: 4.8 10*3/uL (ref 1.7–7.7)
Neutrophils Relative %: 73 % (ref 43–77)
Platelets: 227 10*3/uL (ref 150–400)
RDW: 13.5 % (ref 11.5–15.5)
WBC: 6.6 10*3/uL (ref 4.0–10.5)

## 2012-09-23 LAB — COMPREHENSIVE METABOLIC PANEL
ALT: 13 U/L (ref 0–35)
AST: 17 U/L (ref 0–37)
Albumin: 4.3 g/dL (ref 3.5–5.2)
Calcium: 9.1 mg/dL (ref 8.4–10.5)
Chloride: 103 mEq/L (ref 96–112)
Potassium: 4.1 mEq/L (ref 3.5–5.3)
Sodium: 138 mEq/L (ref 135–145)

## 2012-09-23 LAB — LIPID PANEL: LDL Cholesterol: 83 mg/dL (ref 0–99)

## 2012-09-23 LAB — FERRITIN: Ferritin: 12 ng/mL (ref 10–291)

## 2012-09-23 MED ORDER — PROGESTERONE MICRONIZED 100 MG PO CAPS: 100.0000 mg | ORAL_CAPSULE | Freq: Every day | ORAL | Status: DC

## 2012-09-23 MED ORDER — ESTRADIOL 0.1 MG/24HR TD PTTW: 1.0000 | MEDICATED_PATCH | TRANSDERMAL | Status: DC

## 2012-09-23 NOTE — Progress Notes (Signed)
Phyllis Morgan Jul 02, 1960 [id number]        52 y.o.  [id number] for annual exam.  Several issues below.  Past medical history,surgical history, medications, allergies, family history and social history were all reviewed and documented in the EPIC chart.  ROS:  Performed and pertinent positives and negatives are included in the history, assessment and plan .  Exam: Kim assistant Filed Vitals:   09/23/12 1037  BP: 114/76  Height: 5\' 5"  (1.651 m)  Weight: 154 lb (69.854 kg)   General appearance  Normal Skin grossly normal Head/Neck normal with no cervical or supraclavicular adenopathy thyroid normal Lungs  clear Cardiac RR, without RMG Abdominal  soft, nontender, without masses, organomegaly or hernia Breasts  examined lying and sitting without masses, retractions, discharge or axillary adenopathy. Pelvic  Ext/BUS/vagina  normal  Cervix  normal  Uterus  anteverted, normal size, shape and contour, midline and mobile nontender   Adnexa  Without masses or tenderness    Anus and perineum  normal   Rectovaginal  normal sphincter tone without palpated masses or tenderness.    Assessment/Plan:  52 y.o. [id number] female for annual exam, postmenopausal, tubal sterilization.   1. Menopause. Patient started on Vivelle 0.1 mg patches and Prometrium 100 mg nightly earlier this year due to unacceptable night sweats and hot flashes. She's had great response is doing well and wants to continue. We have reviewed the issues of HRT per 03/11/2012 note. I refilled her times a year. Patient knows to report any bleeding at all. 2. Mammogram 02/2012. She will continue annual mammography. SBE monthly reviewed. 3. Pap smear 2012. No Pap smear done today. No history of abnormal Pap smears previously. Plan repeat Pap smear next year 3 year interval. 4. Osteopenia. DEXA 10/2010 with T score -1.4. FRAX 3.9%/0.3%. Check vitamin D level today. Increase calcium vitamin D discussed. Plan repeat DEXA at five-year  interval. 5. Health maintenance. Baseline CBC comprehensive metabolic panel lipid profile urinalysis ordered. Patient asked if I could do her celiac blood work as requested by her gastroenterologist we'll go ahead and check this as ordered and she'll provide the results via My Chart to her gastroenterologist. Followup in 1 year, sooner as needed.  Note: This document was prepared with digital dictation and possible smart phrase technology. Any transcriptional errors that result from this process are unintentional.   Dara Lords MD, 11:06 AM 09/23/2012

## 2012-09-23 NOTE — Patient Instructions (Signed)
Followup in one year, sooner as needed. If you do any vaginal bleeding call the office.

## 2012-09-24 LAB — URINALYSIS W MICROSCOPIC + REFLEX CULTURE
Casts: NONE SEEN
Glucose, UA: NEGATIVE mg/dL
Nitrite: NEGATIVE
Protein, ur: NEGATIVE mg/dL
pH: 7 (ref 5.0–8.0)

## 2012-09-25 LAB — URINE CULTURE: Colony Count: NO GROWTH

## 2012-09-26 LAB — GLIA (IGA/G) + TTG IGA
Gliadin IgA: 9.7 U/mL (ref <20)
Gliadin IgG: 26.8 U/mL — ABNORMAL HIGH (ref <20)

## 2012-09-27 LAB — RETICULIN ANTIBODIES, IGA W TITER: Reticulin Ab, IgA: NEGATIVE

## 2012-09-28 ENCOUNTER — Telehealth: Payer: Self-pay

## 2012-09-28 NOTE — Telephone Encounter (Signed)
Per Dr. Loetta Rough I called patient yesterday and left message that she could see her results on My Chart.  Patient called today to say she could not view them as they were not there. She questioned was there a "lag time"?    I called patient back and told her I was not sure. I will route this message to Sharrie Rothman and ask her to make sure labs have been released to My Chart and let patient know when she can view.

## 2012-09-29 NOTE — Telephone Encounter (Signed)
Labs released and patient informed.

## 2012-12-08 ENCOUNTER — Other Ambulatory Visit: Payer: Self-pay

## 2013-01-19 ENCOUNTER — Encounter: Payer: Self-pay | Admitting: Gynecology

## 2013-02-28 ENCOUNTER — Other Ambulatory Visit: Payer: Self-pay | Admitting: Gynecology

## 2013-03-03 ENCOUNTER — Encounter: Payer: Self-pay | Admitting: Gynecology

## 2013-03-03 ENCOUNTER — Ambulatory Visit (INDEPENDENT_AMBULATORY_CARE_PROVIDER_SITE_OTHER): Payer: BC Managed Care – PPO | Admitting: Gynecology

## 2013-03-03 DIAGNOSIS — J42 Unspecified chronic bronchitis: Secondary | ICD-10-CM

## 2013-03-03 DIAGNOSIS — J329 Chronic sinusitis, unspecified: Secondary | ICD-10-CM

## 2013-03-03 DIAGNOSIS — N949 Unspecified condition associated with female genital organs and menstrual cycle: Secondary | ICD-10-CM

## 2013-03-03 DIAGNOSIS — N764 Abscess of vulva: Secondary | ICD-10-CM

## 2013-03-03 DIAGNOSIS — R102 Pelvic and perineal pain: Secondary | ICD-10-CM

## 2013-03-03 MED ORDER — AMOXICILLIN-POT CLAVULANATE 875-125 MG PO TABS: 1.0000 | ORAL_TABLET | Freq: Two times a day (BID) | ORAL | Status: DC

## 2013-03-03 NOTE — Progress Notes (Signed)
Patient ID: Phyllis Morgan, female   DOB: [birth year], 53 y.o.   MRN: [id number]  Patient presents with 3 issues: 1. A "bump" on her vulva. Noticed over the last week or so. Slightly tender. No history of this before. No vaginal discharge, odor or other symptoms. 2. Right pelvic discomfort reminiscent when she had an ovarian cyst before. Nagging coming and going pain over the last several months. Lasting several days at a time. Amenorrheic on HRT without bleeding. No nausea vomiting diarrhea constipation. No frequency dysuria urgency. 3. Chronic sinusitis. Periorbital throbbing discomfort treated with antibiotics by her primary previously with transient improvement but now returns. Also with sinus congestion. Most recently with chronic cough without sputum reduction, fever chills.  Exam was [employer] normal with the exception of periorbital tenderness to pressure. No evidence of pharyngitis or lymphadenopathy Lungs clear. Cardiac regular rate no rubs murmurs or gallops. Abdomen soft nontender without masses guarding rebound organomegaly. Pelvic external with classic boil lower left perineal body/perianal region. Approximately 2 cm across with inflammation and overlying erythema. Slightly fluctuant but not pointing.  Physical Exam  Genitourinary:       BUS vagina normal. Cervix normal. Uterus normal size, mobile nontender. Adnexa without masses or tenderness. Rectal exam normal.  Assessment and plan: 1. Perineal boil. Away from the Bartholin's area. Does not appear to be perirectal. Somewhat fluctuant but not enough to lance. Recommend heat and will cover with antibiotics Augmentin 875 mg twice a day x10 days which will also address #2. Followup if symptoms persist, worsen or recur. 2. Sinusitis/bronchitis. Will cover with Augmentin 875 twice a day x10 days. Followup if symptoms persist, worsen or recur. 3. Intermittent right lower quadrant pain reminiscent of ovarian cyst in the past.  Exam is normal. Start with she went ultrasound to rule out nonpalpable abnormalities. Asked patient to schedule in 2 weeks to allow time for #1 and #2 to resolve and we'll reassess at that point. Followup sooner if #1 her to worsens or pain becomes a more acute issue. Check urinalysis also.

## 2013-03-03 NOTE — Patient Instructions (Signed)
Start antibiotics twice daily for 10 days. Apply heat to the affected area. Followup for ultrasound as scheduled.  Sinusitis Sinusitis is redness, soreness, and swelling (inflammation) of the paranasal sinuses. Paranasal sinuses are air pockets within the bones of your face (beneath the eyes, the middle of the forehead, or above the eyes). In healthy paranasal sinuses, mucus is able to drain out, and air is able to circulate through them by way of your nose. However, when your paranasal sinuses are inflamed, mucus and air can become trapped. This can allow bacteria and other germs to grow and cause infection. Sinusitis can develop quickly and last only a short time (acute) or continue over a long period (chronic). Sinusitis that lasts for more than 12 weeks is considered chronic.  CAUSES  Causes of sinusitis include:  Allergies.  Structural abnormalities, such as displacement of the cartilage that separates your nostrils (deviated septum), which can decrease the air flow through your nose and sinuses and affect sinus drainage.  Functional abnormalities, such as when the small hairs (cilia) that line your sinuses and help remove mucus do not work properly or are not present. SYMPTOMS  Symptoms of acute and chronic sinusitis are the same. The primary symptoms are pain and pressure around the affected sinuses. Other symptoms include:  Upper toothache.  Earache.  Headache.  Bad breath.  Decreased sense of smell and taste.  A cough, which worsens when you are lying flat.  Fatigue.  Fever.  Thick drainage from your nose, which often is green and may contain pus (purulent).  Swelling and warmth over the affected sinuses. DIAGNOSIS  Your caregiver will perform a physical exam. During the exam, your caregiver may:  Look in your nose for signs of abnormal growths in your nostrils (nasal polyps).  Tap over the affected sinus to check for signs of infection.  View the inside of your  sinuses (endoscopy) with a special imaging device with a light attached (endoscope), which is inserted into your sinuses. If your caregiver suspects that you have chronic sinusitis, one or more of the following tests may be recommended:  Allergy tests.  Nasal culture A sample of mucus is taken from your nose and sent to a lab and screened for bacteria.  Nasal cytology A sample of mucus is taken from your nose and examined by your caregiver to determine if your sinusitis is related to an allergy. TREATMENT  Most cases of acute sinusitis are related to a viral infection and will resolve on their own within 10 days. Sometimes medicines are prescribed to help relieve symptoms (pain medicine, decongestants, nasal steroid sprays, or saline sprays).  However, for sinusitis related to a bacterial infection, your caregiver will prescribe antibiotic medicines. These are medicines that will help kill the bacteria causing the infection.  Rarely, sinusitis is caused by a fungal infection. In theses cases, your caregiver will prescribe antifungal medicine. For some cases of chronic sinusitis, surgery is needed. Generally, these are cases in which sinusitis recurs more than 3 times per year, despite other treatments. HOME CARE INSTRUCTIONS   Drink plenty of water. Water helps thin the mucus so your sinuses can drain more easily.  Use a humidifier.  Inhale steam 3 to 4 times a day (for example, sit in the bathroom with the shower running).  Apply a warm, moist washcloth to your face 3 to 4 times a day, or as directed by your caregiver.  Use saline nasal sprays to help moisten and clean your sinuses.  Take over-the-counter or prescription medicines for pain, discomfort, or fever only as directed by your caregiver. SEEK IMMEDIATE MEDICAL CARE IF:  You have increasing pain or severe headaches.  You have nausea, vomiting, or drowsiness.  You have swelling around your face.  You have vision  problems.  You have a stiff neck.  You have difficulty breathing. MAKE SURE YOU:   Understand these instructions.  Will watch your condition.  Will get help right away if you are not doing well or get worse. Document Released: 01/19/2005 Document Revised: 04/13/2011 Document Reviewed: 02/03/2011 [facility] Patient Information 2014 [place], [place].

## 2013-03-04 LAB — URINALYSIS W MICROSCOPIC + REFLEX CULTURE
Bilirubin Urine: NEGATIVE
Casts: NONE SEEN
Crystals: NONE SEEN
GLUCOSE, UA: NEGATIVE mg/dL
Hgb urine dipstick: NEGATIVE
Ketones, ur: NEGATIVE mg/dL
NITRITE: NEGATIVE
PROTEIN: NEGATIVE mg/dL
Specific Gravity, Urine: 1.019 (ref 1.005–1.030)
UROBILINOGEN UA: 0.2 mg/dL (ref 0.0–1.0)
pH: 5.5 (ref 5.0–8.0)

## 2013-03-05 LAB — URINE CULTURE

## 2013-03-17 ENCOUNTER — Ambulatory Visit (INDEPENDENT_AMBULATORY_CARE_PROVIDER_SITE_OTHER): Payer: BC Managed Care – PPO

## 2013-03-17 ENCOUNTER — Ambulatory Visit (INDEPENDENT_AMBULATORY_CARE_PROVIDER_SITE_OTHER): Payer: BC Managed Care – PPO | Admitting: Gynecology

## 2013-03-17 ENCOUNTER — Encounter: Payer: Self-pay | Admitting: Gynecology

## 2013-03-17 DIAGNOSIS — N949 Unspecified condition associated with female genital organs and menstrual cycle: Secondary | ICD-10-CM

## 2013-03-17 DIAGNOSIS — R102 Pelvic and perineal pain: Secondary | ICD-10-CM

## 2013-03-17 DIAGNOSIS — R1031 Right lower quadrant pain: Secondary | ICD-10-CM

## 2013-03-17 NOTE — Progress Notes (Signed)
Patient presents for full ultrasound due to right lower quadrant discomfort.  Ultrasound shows uterus normal size and echotexture. Endometrial echo 2.0 mm. Right left ovary is visualized and postmenopausal. Cul-de-sac negative.  Assessment and plan: Right lower quadrant discomfort, comes and goes. Exam was negative. Urinalysis negative. Ultrasound normal. Recommend gastroenterology followup if pain persists. Do not think that is from a GYN source. Patient also relates that her bronchitis and her perineal boil are both better.

## 2013-03-17 NOTE — Patient Instructions (Addendum)
Followup with gastroenterology if your pain continues. Otherwise followup with me routinely when due for annual exam.

## 2013-03-28 ENCOUNTER — Encounter: Payer: Self-pay | Admitting: Gynecology

## 2013-03-29 ENCOUNTER — Other Ambulatory Visit: Payer: Self-pay

## 2013-03-29 MED ORDER — ESTRADIOL 0.1 MG/24HR TD PTTW: 1.0000 | MEDICATED_PATCH | TRANSDERMAL | Status: DC

## 2013-04-30 ENCOUNTER — Encounter: Payer: Self-pay | Admitting: Gynecology

## 2013-05-02 ENCOUNTER — Other Ambulatory Visit: Payer: Self-pay | Admitting: *Deleted

## 2013-05-02 ENCOUNTER — Telehealth: Payer: Self-pay | Admitting: *Deleted

## 2013-05-02 ENCOUNTER — Encounter: Payer: Self-pay | Admitting: Gynecology

## 2013-05-02 DIAGNOSIS — M8580 Other specified disorders of bone density and structure, unspecified site: Secondary | ICD-10-CM

## 2013-05-02 NOTE — Telephone Encounter (Signed)
Pt called requesting bone density order faxed to [facility], order signed and faxed.

## 2013-05-02 NOTE — Telephone Encounter (Signed)
Some patients will have breast tenderness while on estrogen replacement. Try stopping it for several days and then restarting and see if that doesn't help with the tenderness.

## 2013-05-15 ENCOUNTER — Encounter: Payer: Self-pay | Admitting: Gynecology

## 2013-05-16 ENCOUNTER — Encounter: Payer: Self-pay | Admitting: Gynecology

## 2013-05-16 NOTE — Telephone Encounter (Signed)
It is a Water quality scientist that they have to comment on the density of the breast. There is a debate as to whether dense breasts have a higher incidence of cancer or whether it is just more difficult to pick up very early cancers in dense breasts. There is also some evidence that patients who are on HRT once they stop HRT the breast density may become less. This has to be weighed against the benefits of HRT as far as symptom relief.

## 2013-05-16 NOTE — Telephone Encounter (Signed)
Dr. Blanchard Mane do not have her Dexa Results back yet from [facility] but if you could advise about her second question. Thanks!

## 2013-05-18 ENCOUNTER — Other Ambulatory Visit: Payer: Self-pay | Admitting: *Deleted

## 2013-05-18 DIAGNOSIS — M8580 Other specified disorders of bone density and structure, unspecified site: Secondary | ICD-10-CM

## 2013-05-19 ENCOUNTER — Encounter: Payer: Self-pay | Admitting: Gynecology

## 2013-06-27 ENCOUNTER — Encounter: Payer: Self-pay | Admitting: Gynecology

## 2013-06-28 ENCOUNTER — Other Ambulatory Visit: Payer: Self-pay | Admitting: Gynecology

## 2013-06-28 MED ORDER — ESTRADIOL 0.1 MG/24HR TD PTTW: 1.0000 | MEDICATED_PATCH | TRANSDERMAL | Status: DC

## 2013-06-28 NOTE — Telephone Encounter (Signed)
That can be a problem with the generic patches. Okay to refill remainder of her prescription with Minivelle equivalent dose

## 2013-10-30 ENCOUNTER — Other Ambulatory Visit: Payer: Self-pay | Admitting: Gynecology

## 2013-12-04 ENCOUNTER — Encounter: Payer: Self-pay | Admitting: Gynecology

## 2013-12-05 ENCOUNTER — Ambulatory Visit (INDEPENDENT_AMBULATORY_CARE_PROVIDER_SITE_OTHER): Payer: BC Managed Care – PPO | Admitting: Gynecology

## 2013-12-05 ENCOUNTER — Encounter: Payer: Self-pay | Admitting: Gynecology

## 2013-12-05 ENCOUNTER — Other Ambulatory Visit (HOSPITAL_COMMUNITY)
Admission: RE | Admit: 2013-12-05 | Discharge: 2013-12-05 | Disposition: A | Discharge status: Home / Self Care | Payer: BC Managed Care – PPO | Source: Ambulatory Visit | Attending: Gynecology | Admitting: Gynecology

## 2013-12-05 VITALS — BP 112/66 | Ht 65.0 in | Wt 156.0 lb

## 2013-12-05 DIAGNOSIS — Z01419 Encounter for gynecological examination (general) (routine) without abnormal findings: Secondary | ICD-10-CM | POA: Insufficient documentation

## 2013-12-05 DIAGNOSIS — M8580 Other specified disorders of bone density and structure, unspecified site: Secondary | ICD-10-CM

## 2013-12-05 DIAGNOSIS — Z1151 Encounter for screening for human papillomavirus (HPV): Secondary | ICD-10-CM | POA: Insufficient documentation

## 2013-12-05 DIAGNOSIS — Z79890 Hormone replacement therapy (postmenopausal): Secondary | ICD-10-CM

## 2013-12-05 LAB — CBC WITH DIFFERENTIAL/PLATELET
BASOS ABS: 0 10*3/uL (ref 0.0–0.1)
Basophils Relative: 0 % (ref 0–1)
EOS ABS: 0.1 10*3/uL (ref 0.0–0.7)
Eosinophils Relative: 1 % (ref 0–5)
HCT: 36.7 % (ref 36.0–46.0)
Hemoglobin: 12.7 g/dL (ref 12.0–15.0)
Lymphocytes Relative: 26 % (ref 12–46)
Lymphs Abs: 1.8 10*3/uL (ref 0.7–4.0)
MCH: 30.8 pg (ref 26.0–34.0)
MCHC: 34.6 g/dL (ref 30.0–36.0)
MCV: 89.1 fL (ref 78.0–100.0)
MONOS PCT: 7 % (ref 3–12)
Monocytes Absolute: 0.5 10*3/uL (ref 0.1–1.0)
NEUTROS PCT: 66 % (ref 43–77)
Neutro Abs: 4.5 10*3/uL (ref 1.7–7.7)
Platelets: 228 10*3/uL (ref 150–400)
RBC: 4.12 MIL/uL (ref 3.87–5.11)
RDW: 13 % (ref 11.5–15.5)
WBC: 6.8 10*3/uL (ref 4.0–10.5)

## 2013-12-05 MED ORDER — MINIVELLE 0.1 MG/24HR TD PTTW: 1.0000 | MEDICATED_PATCH | TRANSDERMAL | Status: DC

## 2013-12-05 MED ORDER — PROGESTERONE MICRONIZED 100 MG PO CAPS: ORAL_CAPSULE | ORAL | Status: DC

## 2013-12-05 NOTE — Progress Notes (Signed)
Phyllis Morgan 12-11-1960 [id number]        53 y.o.  [id number] for annual exam.  Several issues noted below.  Past medical history,surgical history, problem list, medications, allergies, family history and social history were all reviewed and documented as reviewed in the EPIC chart.  ROS:  12 system ROS performed with pertinent positives and negatives included in the history, assessment and plan.   Additional significant findings :  Kim   Exam: none assistant Filed Vitals:   12/05/13 1556  BP: 112/66  Height: 5' 5"  (1.651 m)  Weight: 156 lb (70.761 kg)   General appearance:  Normal affect, orientation and appearance. Skin: Grossly normal HEENT: Without gross lesions.  No cervical or supraclavicular adenopathy. Thyroid normal.  Lungs:  Clear without wheezing, rales or rhonchi Cardiac: RR, without RMG Abdominal:  Soft, nontender, without masses, guarding, rebound, organomegaly or hernia Breasts:  Examined lying and sitting without masses, retractions, discharge or axillary adenopathy. Pelvic:  Ext/BUS/vagina normal  Cervix normal. Pap done  Uterus anteverted, normal size, shape and contour, midline and mobile nontender   Adnexa  Without masses or tenderness    Anus and perineum  Normal   Rectovaginal  Normal sphincter tone without palpated masses or tenderness.    Assessment/Plan:  53 y.o. [id number] female for annual exam.   1. Postmenopausal/menopausal symptoms/HRT. Patient doing well on her minivelle 0.1 mg and Prometrium 100 mg at night. Started due to significant hot flush night sweats symptoms.  No vaginal bleeding.  I again reviewed the whole issue of HRT with her to include the WHI study with increased risk of stroke, heart attack, DVT and breast cancer. The ACOG and NAMS statements for lowest dose for the shortest period of time reviewed. Transdermal versus oral first-pass effect benefit discussed.  Patient strongly wants to continue at this time and I refilled her 1 year.   Report any vaginal bleeding. 2. Osteopenia. DEXA 05/2013 T score -1.4. No change from prior DEXA. Plan repeat at 5 year interval. Increased calcium vitamin D reviewed. Check vitamin D level today. 3. Pap smear 2012. Pap/HPV today. No history of abnormal Pap smears previously. Plan repeat Pap smear at 3-5 year interval assuming this Pap smear is normal per current screening guidelines. 4. Colonoscopy 2013. Repeat at their recommended interval. 5. Mammography 05/2013. Continue with annual mammography. SBE monthly reviewed. 6. Health maintenance. Baseline CBC, comprehensive metabolic panel lipid profile TSH vitamin D urinalysis ordered. Follow up in one year, sooner as needed.     Anastasio Auerbach MD, 4:16 PM 12/05/2013

## 2013-12-05 NOTE — Addendum Note (Signed)
Addended by: Nelva Nay on: 12/05/2013 04:33 PM   Modules accepted: Orders, SmartSet

## 2013-12-05 NOTE — Patient Instructions (Signed)
You may obtain a copy of any labs that were done today by logging onto MyChart as outlined in the instructions provided with your AVS (after visit summary). The office will not call with normal lab results but certainly if there are any significant abnormalities then we will contact you.   Health Maintenance, Female A healthy lifestyle and preventative care can promote health and wellness.  Maintain regular health, dental, and eye exams.  Eat a healthy diet. Foods like vegetables, fruits, whole grains, low-fat dairy products, and lean protein foods contain the nutrients you need without too many calories. Decrease your intake of foods high in solid fats, added sugars, and salt. Get information about a proper diet from your caregiver, if necessary.  Regular physical exercise is one of the most important things you can do for your health. Most adults should get at least 150 minutes of moderate-intensity exercise (any activity that increases your heart rate and causes you to sweat) each week. In addition, most adults need muscle-strengthening exercises on 2 or more days a week.   Maintain a healthy weight. The body mass index (BMI) is a screening tool to identify possible weight problems. It provides an estimate of body fat based on height and weight. Your caregiver can help determine your BMI, and can help you achieve or maintain a healthy weight. For adults 20 years and older:  A BMI below 18.5 is considered underweight.  A BMI of 18.5 to 24.9 is normal.  A BMI of 25 to 29.9 is considered overweight.  A BMI of 30 and above is considered obese.  Maintain normal blood lipids and cholesterol by exercising and minimizing your intake of saturated fat. Eat a balanced diet with plenty of fruits and vegetables. Blood tests for lipids and cholesterol should begin at age 61 and be repeated every 5 years. If your lipid or cholesterol levels are high, you are over 50, or you are a high risk for heart  disease, you may need your cholesterol levels checked more frequently.Ongoing high lipid and cholesterol levels should be treated with medicines if diet and exercise are not effective.  If you smoke, find out from your caregiver how to quit. If you do not use tobacco, do not start.  Lung cancer screening is recommended for adults aged 33 80 years who are at high risk for developing lung cancer because of a history of smoking. Yearly low-dose computed tomography (CT) is recommended for people who have at least a 30-pack-year history of smoking and are a current smoker or have quit within the past 15 years. A pack year of smoking is smoking an average of 1 pack of cigarettes a day for 1 year (for example: 1 pack a day for 30 years or 2 packs a day for 15 years). Yearly screening should continue until the smoker has stopped smoking for at least 15 years. Yearly screening should also be stopped for people who develop a health problem that would prevent them from having lung cancer treatment.  If you are pregnant, do not drink alcohol. If you are breastfeeding, be very cautious about drinking alcohol. If you are not pregnant and choose to drink alcohol, do not exceed 1 drink per day. One drink is considered to be 12 ounces (355 mL) of beer, 5 ounces (148 mL) of wine, or 1.5 ounces (44 mL) of liquor.  Avoid use of street drugs. Do not share needles with anyone. Ask for help if you need support or instructions about stopping  the use of drugs.  High blood pressure causes heart disease and increases the risk of stroke. Blood pressure should be checked at least every 1 to 2 years. Ongoing high blood pressure should be treated with medicines, if weight loss and exercise are not effective.  If you are 59 to 51 years old, ask your caregiver if you should take aspirin to prevent strokes.  Diabetes screening involves taking a blood sample to check your fasting blood sugar level. This should be done once every 3  years, after age 91, if you are within normal weight and without risk factors for diabetes. Testing should be considered at a younger age or be carried out more frequently if you are overweight and have at least 1 risk factor for diabetes.  Breast cancer screening is essential preventative care for women. You should practice "breast self-awareness." This means understanding the normal appearance and feel of your breasts and may include breast self-examination. Any changes detected, no matter how small, should be reported to a caregiver. Women in their 66s and 30s should have a clinical breast exam (CBE) by a caregiver as part of a regular health exam every 1 to 3 years. After age 101, women should have a CBE every year. Starting at age 100, women should consider having a mammogram (breast X-ray) every year. Women who have a family history of breast cancer should talk to their caregiver about genetic screening. Women at a high risk of breast cancer should talk to their caregiver about having an MRI and a mammogram every year.  Breast cancer gene (BRCA)-related cancer risk assessment is recommended for women who have family members with BRCA-related cancers. BRCA-related cancers include breast, ovarian, tubal, and peritoneal cancers. Having family members with these cancers may be associated with an increased risk for harmful changes (mutations) in the breast cancer genes BRCA1 and BRCA2. Results of the assessment will determine the need for genetic counseling and BRCA1 and BRCA2 testing.  The Pap test is a screening test for cervical cancer. Women should have a Pap test starting at age 57. Between ages 25 and 35, Pap tests should be repeated every 2 years. Beginning at age 37, you should have a Pap test every 3 years as long as the past 3 Pap tests have been normal. If you had a hysterectomy for a problem that was not cancer or a condition that could lead to cancer, then you no longer need Pap tests. If you are  between ages 50 and 76, and you have had normal Pap tests going back 10 years, you no longer need Pap tests. If you have had past treatment for cervical cancer or a condition that could lead to cancer, you need Pap tests and screening for cancer for at least 20 years after your treatment. If Pap tests have been discontinued, risk factors (such as a new sexual partner) need to be reassessed to determine if screening should be resumed. Some women have medical problems that increase the chance of getting cervical cancer. In these cases, your caregiver may recommend more frequent screening and Pap tests.  The human papillomavirus (HPV) test is an additional test that may be used for cervical cancer screening. The HPV test looks for the virus that can cause the cell changes on the cervix. The cells collected during the Pap test can be tested for HPV. The HPV test could be used to screen women aged 44 years and older, and should be used in women of any age  who have unclear Pap test results. After the age of 55, women should have HPV testing at the same frequency as a Pap test.  Colorectal cancer can be detected and often prevented. Most routine colorectal cancer screening begins at the age of 44 and continues through age 20. However, your caregiver may recommend screening at an earlier age if you have risk factors for colon cancer. On a yearly basis, your caregiver may provide home test kits to check for hidden blood in the stool. Use of a small camera at the end of a tube, to directly examine the colon (sigmoidoscopy or colonoscopy), can detect the earliest forms of colorectal cancer. Talk to your caregiver about this at age 86, when routine screening begins. Direct examination of the colon should be repeated every 5 to 10 years through age 13, unless early forms of pre-cancerous polyps or small growths are found.  Hepatitis C blood testing is recommended for all people born from [birth year] through 1963 and any  individual with known risks for hepatitis C.  Practice safe sex. Use condoms and avoid high-risk sexual practices to reduce the spread of sexually transmitted infections (STIs). Sexually active women aged 36 and younger should be checked for Chlamydia, which is a common sexually transmitted infection. Older women with new or multiple partners should also be tested for Chlamydia. Testing for other STIs is recommended if you are sexually active and at increased risk.  Osteoporosis is a disease in which the bones lose minerals and strength with aging. This can result in serious bone fractures. The risk of osteoporosis can be identified using a bone density scan. Women ages 20 and over and women at risk for fractures or osteoporosis should discuss screening with their caregivers. Ask your caregiver whether you should be taking a calcium supplement or vitamin D to reduce the rate of osteoporosis.  Menopause can be associated with physical symptoms and risks. Hormone replacement therapy is available to decrease symptoms and risks. You should talk to your caregiver about whether hormone replacement therapy is right for you.  Use sunscreen. Apply sunscreen liberally and repeatedly throughout the day. You should seek shade when your shadow is shorter than you. Protect yourself by wearing long sleeves, pants, a wide-brimmed hat, and sunglasses year round, whenever you are outdoors.  Notify your caregiver of new moles or changes in moles, especially if there is a change in shape or color. Also notify your caregiver if a mole is larger than the size of a pencil eraser.  Stay current with your immunizations. Document Released: 08/04/2010 Document Revised: 05/16/2012 Document Reviewed: 08/04/2010 [facility] Patient Information 2014 [employer].

## 2013-12-06 LAB — COMPREHENSIVE METABOLIC PANEL
ALT: 14 U/L (ref 0–35)
AST: 20 U/L (ref 0–37)
Albumin: 3.8 g/dL (ref 3.5–5.2)
Alkaline Phosphatase: 62 U/L (ref 39–117)
BILIRUBIN TOTAL: 0.3 mg/dL (ref 0.2–1.2)
BUN: 16 mg/dL (ref 6–23)
CO2: 26 mEq/L (ref 19–32)
Calcium: 8.7 mg/dL (ref 8.4–10.5)
Chloride: 105 mEq/L (ref 96–112)
Creat: 0.72 mg/dL (ref 0.50–1.10)
GLUCOSE: 76 mg/dL (ref 70–99)
Potassium: 3.5 mEq/L (ref 3.5–5.3)
Sodium: 138 mEq/L (ref 135–145)
Total Protein: 6.5 g/dL (ref 6.0–8.3)

## 2013-12-06 LAB — VITAMIN D 25 HYDROXY (VIT D DEFICIENCY, FRACTURES): Vit D, 25-Hydroxy: 34 ng/mL (ref 30–89)

## 2013-12-06 LAB — URINALYSIS W MICROSCOPIC + REFLEX CULTURE
Bacteria, UA: NONE SEEN
Bilirubin Urine: NEGATIVE
CRYSTALS: NONE SEEN
Casts: NONE SEEN
Glucose, UA: NEGATIVE mg/dL
HGB URINE DIPSTICK: NEGATIVE
KETONES UR: NEGATIVE mg/dL
NITRITE: NEGATIVE
Protein, ur: NEGATIVE mg/dL
SPECIFIC GRAVITY, URINE: 1.008 (ref 1.005–1.030)
UROBILINOGEN UA: 0.2 mg/dL (ref 0.0–1.0)
pH: 7.5 (ref 5.0–8.0)

## 2013-12-06 LAB — TSH: TSH: 1.249 u[IU]/mL (ref 0.350–4.500)

## 2013-12-06 LAB — LIPID PANEL
Cholesterol: 127 mg/dL (ref 0–200)
HDL: 29 mg/dL — ABNORMAL LOW (ref >39)
LDL CALC: 65 mg/dL (ref 0–99)
TRIGLYCERIDES: 163 mg/dL — AB (ref <150)
Total CHOL/HDL Ratio: 4.4 Ratio
VLDL: 33 mg/dL (ref 0–40)

## 2013-12-07 LAB — CYTOLOGY - PAP

## 2013-12-07 LAB — URINE CULTURE
Colony Count: NO GROWTH
ORGANISM ID, BACTERIA: NO GROWTH

## 2014-03-21 ENCOUNTER — Telehealth: Payer: Self-pay | Admitting: Gastroenterology

## 2014-03-21 ENCOUNTER — Encounter: Payer: Self-pay | Admitting: Gastroenterology

## 2014-03-21 NOTE — Telephone Encounter (Signed)
Records approved.  appt made with pt

## 2014-05-16 ENCOUNTER — Encounter: Payer: Self-pay | Admitting: Gastroenterology

## 2014-05-16 ENCOUNTER — Ambulatory Visit (INDEPENDENT_AMBULATORY_CARE_PROVIDER_SITE_OTHER): Payer: BC Managed Care – PPO | Admitting: Gastroenterology

## 2014-05-16 VITALS — BP 110/70 | HR 68 | Ht 64.0 in | Wt 166.0 lb

## 2014-05-16 DIAGNOSIS — K219 Gastro-esophageal reflux disease without esophagitis: Secondary | ICD-10-CM | POA: Diagnosis not present

## 2014-05-16 DIAGNOSIS — K900 Celiac disease: Secondary | ICD-10-CM

## 2014-05-16 DIAGNOSIS — Z860100 Personal history of colon polyps, unspecified: Secondary | ICD-10-CM

## 2014-05-16 NOTE — Progress Notes (Signed)
EGD Dr. Collene Mares 10/2005 normal, done for epig pain, reflux Colonoscopy Dr. Collene Mares 12/2011 done for IDA, FH of precancerous polyp: 7 small polyps (path mixed TAs, HPs) EGD 12/2011  Dr. Collene Mares for IDA, epig pain; multiple large sessile polyps removed with hot snare time 10; path fundic gland polyps, duodenal biopsies intrepith lymphocytosis  HPI: This is a  very pleasant 54 year old woman whom I am meeting for the first time today  She has had multiple procedures by another gastroenterologist, see those results summarized above which I just reviewed.  Switching GI's   Has had GERD for many years, on PPIs for many years.  She doesn't have pyrosis but feels burning in her stomach.  May have tried pepcid years ago, has tried many many PPIs.    She watches her caffeine intake except for some in hot chocolate  Usually last meal is before 2 hours before laying down.  Weight stable overall.  Last colonoscopy; wants my opinion on timing of next colonoscopy.  She avoids gluten.  She strictly avoids gluten;     Review of systems: Pertinent positive and negative review of systems were noted in the above HPI section. Complete review of systems was performed and was otherwise normal.    Past Medical History  Diagnosis Date  . GERD (gastroesophageal reflux disease)   . Osteopenia 05/2013     T score -1.4 no change from prior DEXA  . Seasonal allergies   . Celiac disease   . IDA (iron deficiency anemia)   . Biliary dyskinesia   . Gastric polyps   . Colon polyps     Past Surgical History  Procedure Laterality Date  . Cesarean section    . Umbilical hernia repair    . Hand surgery      THUMB/RIGHT HAND  . Pelvic laparoscopy      OVARIAN CYSTECTOMY  . Tubal ligation  12/2002    FALLOPE RINGS  . Endometrial ablation  12/21/06    HER OPTION CRYOABLATION   . Cholecystectomy  2007  . Abdominal surgery  1993    Rt Ovarian Cystectomy    Current Outpatient Prescriptions  Medication Sig  Dispense Refill  . Cetirizine HCl (ZYRTEC PO) Take by mouth.      . Dexlansoprazole (DEXILANT PO) Take by mouth.      . fluticasone (FLONASE) 50 MCG/ACT nasal spray Place 2 sprays into the nose daily.    . GuaiFENesin (MUCINEX PO) Take by mouth.      Marland Kitchen MINIVELLE 0.1 MG/24HR patch Place 1 patch (0.1 mg total) onto the skin 2 (two) times a week. 8 patch 11  . progesterone (PROMETRIUM) 100 MG capsule TAKE 1 CAPSULES BY MOUTH EVERY NIGHT AT BEDTIME 30 capsule 11   No current facility-administered medications for this visit.    Allergies as of 05/16/2014 - Review Complete 05/16/2014  Allergen Reaction Noted  . Avelox [moxifloxacin hcl in nacl] Nausea Only 08/13/2010  . Sulfa antibiotics Other (See Comments) 08/13/2010    Family History  Problem Relation Age of Onset  . Hypertension Mother   . Heart disease Mother   . Cancer Mother     SKIN CANCER  . Hypertension Father   . Cancer Father     COLON  . Heart disease Maternal Grandmother   . Cancer Paternal Grandmother     OV/UT CANCER    History   Social History  . Marital Status: Married    Spouse Name: N/A  . Number of Children: 1  .  Years of Education: N/A   Occupational History  . Manager    Social History Main Topics  . Smoking status: Never Smoker   . Smokeless tobacco: Never Used  . Alcohol Use: No     Comment: Rare  . Drug Use: No  . Sexual Activity: Yes    Birth Control/ Protection: Surgical     Comment: Tubal lig   Other Topics Concern  . Not on file   Social History Narrative       Physical Exam: BP 110/70 mmHg  Pulse 68  Ht 5' 4"  (1.626 m)  Wt 166 lb (75.297 kg)  BMI 28.48 kg/m2  LMP 10/10/2011 Constitutional: generally well-appearing Psychiatric: alert and oriented x3 Eyes: extraocular movements intact Mouth: oral pharynx moist, no lesions Neck: supple no lymphadenopathy Cardiovascular: heart regular rate and rhythm Lungs: clear to auscultation bilaterally Abdomen: soft, nontender,  nondistended, no obvious ascites, no peritoneal signs, normal bowel sounds Extremities: no lower extremity edema bilaterally Skin: no lesions on visible extremities    Assessment and plan: 54 y.o. 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 She could be "do" for surveillance colonoscopy either 5 years or 3 years from her last report colonoscopy. We decided to split the difference in make it for years which would be November 2017. I think that is perfectly reasonable. She is going to try tapering from proton pump inhibitors to H2 blockers and she will call to report on her response in 4 weeks. 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 She is of [place], she feels much better when she avoids gluten, biopsies were fairly indicative of celiac sprue. I recommended she continue avoiding that wheat protein.   Cc: Nicoletta Dress, MD

## 2014-05-16 NOTE — Patient Instructions (Addendum)
You will be set up for a colonoscopy for polyp surveillance in 12/2015 (recall). Continue avoiding gluten since you are miserable when you eat it.  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 Start ranitidine 139m pill one pill in AM one at bedtime Taper dexilant to every other day for 10 days, then stop completely. Call here in 4 weeks to report on your burning.

## 2014-06-13 ENCOUNTER — Encounter: Payer: Self-pay | Admitting: Gastroenterology

## 2014-06-18 ENCOUNTER — Other Ambulatory Visit: Payer: Self-pay

## 2014-06-18 MED ORDER — DEXLANSOPRAZOLE 60 MG PO CPDR: 60.0000 mg | DELAYED_RELEASE_CAPSULE | Freq: Every day | ORAL | Status: DC

## 2014-06-19 ENCOUNTER — Encounter: Payer: Self-pay | Admitting: Gynecology

## 2014-06-27 ENCOUNTER — Telehealth: Payer: Self-pay | Admitting: Gastroenterology

## 2014-06-28 NOTE — Telephone Encounter (Signed)
Prior auth received and faxed

## 2014-11-05 ENCOUNTER — Other Ambulatory Visit: Payer: Self-pay | Admitting: Gynecology

## 2015-02-04 ENCOUNTER — Encounter: Payer: Self-pay | Admitting: Gynecology

## 2015-02-04 ENCOUNTER — Ambulatory Visit (INDEPENDENT_AMBULATORY_CARE_PROVIDER_SITE_OTHER): Payer: BC Managed Care – PPO | Admitting: Gynecology

## 2015-02-04 VITALS — BP 114/68 | Ht 64.5 in | Wt 164.0 lb

## 2015-02-04 DIAGNOSIS — Z1329 Encounter for screening for other suspected endocrine disorder: Secondary | ICD-10-CM

## 2015-02-04 DIAGNOSIS — Z01419 Encounter for gynecological examination (general) (routine) without abnormal findings: Secondary | ICD-10-CM

## 2015-02-04 DIAGNOSIS — M8580 Other specified disorders of bone density and structure, unspecified site: Secondary | ICD-10-CM | POA: Diagnosis not present

## 2015-02-04 DIAGNOSIS — Z13220 Encounter for screening for lipoid disorders: Secondary | ICD-10-CM

## 2015-02-04 DIAGNOSIS — Z79890 Hormone replacement therapy (postmenopausal): Secondary | ICD-10-CM | POA: Diagnosis not present

## 2015-02-04 MED ORDER — PROGESTERONE MICRONIZED 100 MG PO CAPS: ORAL_CAPSULE | ORAL | Status: DC

## 2015-02-04 MED ORDER — ESTRADIOL 0.1 MG/24HR TD PTTW: MEDICATED_PATCH | TRANSDERMAL | Status: DC

## 2015-02-04 NOTE — Progress Notes (Signed)
Phyllis Morgan November 10, 1960 [id number]        54 y.o.  [id number]  for annual exam.  Doing well.  Past medical history,surgical history, problem list, medications, allergies, family history and social history were all reviewed and documented as reviewed in the EPIC chart.  ROS:  Performed with pertinent positives and negatives included in the history, assessment and plan.   Additional significant findings :  none   Exam: Programmer, multimedia Vitals:   02/04/15 0820  BP: 114/68  Height: 5' 4.5" (1.638 m)  Weight: 164 lb (74.39 kg)   General appearance:  Normal affect, orientation and appearance. Skin: Grossly normal HEENT: Without gross lesions.  No cervical or supraclavicular adenopathy. Thyroid normal.  Lungs:  Clear without wheezing, rales or rhonchi Cardiac: RR, without RMG Abdominal:  Soft, nontender, without masses, guarding, rebound, organomegaly or hernia Breasts:  Examined lying and sitting without masses, retractions, discharge or axillary adenopathy. Pelvic:  Ext/BUS/vagina normal  Cervix normal  Uterus anteverted, normal size, shape and contour, midline and mobile nontender   Adnexa  Without masses or tenderness    Anus and perineum  Normal   Rectovaginal  Normal sphincter tone without palpated masses or tenderness.    Assessment/Plan:  55 y.o. [id number] female for annual exam.   1. Postmenopausal/HRT. Patient continues on minivelle 0.1 mg patches and Prometrium 100 mg nightly. No bleeding. Doing well and wants to continue. I again reviewed all issue of HRT and risks/benefits. Stroke heart attack DVT and breast cancer risks reviewed. Patient understands and wants to continue for now. Refill 1 year provided.  Patient knows to call if any vaginal bleeding. 2. Osteopenia. DEXA 05/2013 T score -1.4 no change from prior DEXA. Check vitamin D level today. As she is continuing on HRT we will wait and repeat the bone density in another year or 2. 3. Pap smear/HPV 2015 negative. No  Pap smear done today. No history of significant abnormal Pap smears. Plan repeat in 3-5 year interval. 4. Colonoscopy 2013. Repeat at their recommended interval. 5. Mammography 06/2014. Continue with annual mammography. SBE monthly reviewed. 6. Health maintenance. Patient requests baseline labs. CBC, comprehensive metabolic panel, lipid profile, TSH, vitamin D and urinalysis ordered. Follow up in one year, sooner as needed.   Anastasio Auerbach MD, 8:42 AM 02/04/2015

## 2015-02-04 NOTE — Patient Instructions (Addendum)
Continue on the hormone replacement as we discussed. Call if any vaginal bleeding.  You may obtain a copy of any labs that were done today by logging onto MyChart as outlined in the instructions provided with your AVS (after visit summary). The office will not call with normal lab results but certainly if there are any significant abnormalities then we will contact you.   Health Maintenance Adopting a healthy lifestyle and getting preventive care can go a long way to promote health and wellness. Talk with your health care provider about what schedule of regular examinations is right for you. This is a good chance for you to check in with your provider about disease prevention and staying healthy. In between checkups, there are plenty of things you can do on your own. Experts have done a lot of research about which lifestyle changes and preventive measures are most likely to keep you healthy. Ask your health care provider for more information. WEIGHT AND DIET  Eat a healthy diet  Be sure to include plenty of vegetables, fruits, low-fat dairy products, and lean protein.  Do not eat a lot of foods high in solid fats, added sugars, or salt.  Get regular exercise. This is one of the most important things you can do for your health.  Most adults should exercise for at least 150 minutes each week. The exercise should increase your heart rate and make you sweat (moderate-intensity exercise).  Most adults should also do strengthening exercises at least twice a week. This is in addition to the moderate-intensity exercise.  Maintain a healthy weight  Body mass index (BMI) is a measurement that can be used to identify possible weight problems. It estimates body fat based on height and weight. Your health care provider can help determine your BMI and help you achieve or maintain a healthy weight.  For females 62 years of age and older:   A BMI below 18.5 is considered underweight.  A BMI of 18.5 to  24.9 is normal.  A BMI of 25 to 29.9 is considered overweight.  A BMI of 30 and above is considered obese.  Watch levels of cholesterol and blood lipids  You should start having your blood tested for lipids and cholesterol at 53 years of age, then have this test every 5 years.  You may need to have your cholesterol levels checked more often if:  Your lipid or cholesterol levels are high.  You are older than 53 years of age.  You are at high risk for heart disease.  CANCER SCREENING   Lung Cancer  Lung cancer screening is recommended for adults 100-67 years old who are at high risk for lung cancer because of a history of smoking.  A yearly low-dose CT scan of the lungs is recommended for people who:  Currently smoke.  Have quit within the past 15 years.  Have at least a 30-pack-year history of smoking. A pack year is smoking an average of one pack of cigarettes a day for 1 year.  Yearly screening should continue until it has been 15 years since you quit.  Yearly screening should stop if you develop a health problem that would prevent you from having lung cancer treatment.  Breast Cancer  Practice breast self-awareness. This means understanding how your breasts normally appear and feel.  It also means doing regular breast self-exams. Let your health care provider know about any changes, no matter how small.  If you are in your 20s or 30s, you  should have a clinical breast exam (CBE) by a health care provider every 1-3 years as part of a regular health exam.  If you are 84 or older, have a CBE every year. Also consider having a breast X-ray (mammogram) every year.  If you have a family history of breast cancer, talk to your health care provider about genetic screening.  If you are at high risk for breast cancer, talk to your health care provider about having an MRI and a mammogram every year.  Breast cancer gene (BRCA) assessment is recommended for women who have family  members with BRCA-related cancers. BRCA-related cancers include:  Breast.  Ovarian.  Tubal.  Peritoneal cancers.  Results of the assessment will determine the need for genetic counseling and BRCA1 and BRCA2 testing. Cervical Cancer Routine pelvic examinations to screen for cervical cancer are no longer recommended for nonpregnant women who are considered low risk for cancer of the pelvic organs (ovaries, uterus, and vagina) and who do not have symptoms. A pelvic examination may be necessary if you have symptoms including those associated with pelvic infections. Ask your health care provider if a screening pelvic exam is right for you.   The Pap test is the screening test for cervical cancer for women who are considered at risk.  If you had a hysterectomy for a problem that was not cancer or a condition that could lead to cancer, then you no longer need Pap tests.  If you are older than 65 years, and you have had normal Pap tests for the past 10 years, you no longer need to have Pap tests.  If you have had past treatment for cervical cancer or a condition that could lead to cancer, you need Pap tests and screening for cancer for at least 20 years after your treatment.  If you no longer get a Pap test, assess your risk factors if they change (such as having a new sexual partner). This can affect whether you should start being screened again.  Some women have medical problems that increase their chance of getting cervical cancer. If this is the case for you, your health care provider may recommend more frequent screening and Pap tests.  The human papillomavirus (HPV) test is another test that may be used for cervical cancer screening. The HPV test looks for the virus that can cause cell changes in the cervix. The cells collected during the Pap test can be tested for HPV.  The HPV test can be used to screen women 59 years of age and older. Getting tested for HPV can extend the interval  between normal Pap tests from three to five years.  An HPV test also should be used to screen women of any age who have unclear Pap test results.  After 53 years of age, women should have HPV testing as often as Pap tests.  Colorectal Cancer  This type of cancer can be detected and often prevented.  Routine colorectal cancer screening usually begins at 53 years of age and continues through 53 years of age.  Your health care provider may recommend screening at an earlier age if you have risk factors for colon cancer.  Your health care provider may also recommend using home test kits to check for hidden blood in the stool.  A small camera at the end of a tube can be used to examine your colon directly (sigmoidoscopy or colonoscopy). This is done to check for the earliest forms of colorectal cancer.  Routine screening  usually begins at age 35.  Direct examination of the colon should be repeated every 5-10 years through 53 years of age. However, you may need to be screened more often if early forms of precancerous polyps or small growths are found. Skin Cancer  Check your skin from head to toe regularly.  Tell your health care provider about any new moles or changes in moles, especially if there is a change in a mole's shape or color.  Also tell your health care provider if you have a mole that is larger than the size of a pencil eraser.  Always use sunscreen. Apply sunscreen liberally and repeatedly throughout the day.  Protect yourself by wearing long sleeves, pants, a wide-brimmed hat, and sunglasses whenever you are outside. HEART DISEASE, DIABETES, AND HIGH BLOOD PRESSURE   Have your blood pressure checked at least every 1-2 years. High blood pressure causes heart disease and increases the risk of stroke.  If you are between 54 years and 23 years old, ask your health care provider if you should take aspirin to prevent strokes.  Have regular diabetes screenings. This involves  taking a blood sample to check your fasting blood sugar level.  If you are at a normal weight and have a low risk for diabetes, have this test once every three years after 53 years of age.  If you are overweight and have a high risk for diabetes, consider being tested at a younger age or more often. PREVENTING INFECTION  Hepatitis B  If you have a higher risk for hepatitis B, you should be screened for this virus. You are considered at high risk for hepatitis B if:  You were born in a country where hepatitis B is common. Ask your health care provider which countries are considered high risk.  Your parents were born in a high-risk country, and you have not been immunized against hepatitis B (hepatitis B vaccine).  You have HIV or AIDS.  You use needles to inject street drugs.  You live with someone who has hepatitis B.  You have had sex with someone who has hepatitis B.  You get hemodialysis treatment.  You take certain medicines for conditions, including cancer, organ transplantation, and autoimmune conditions. Hepatitis C  Blood testing is recommended for:  Everyone born from [birth year] through 1963.  Anyone with known risk factors for hepatitis C. Sexually transmitted infections (STIs)  You should be screened for sexually transmitted infections (STIs) including gonorrhea and chlamydia if:  You are sexually active and are younger than 53 years of age.  You are older than 53 years of age and your health care provider tells you that you are at risk for this type of infection.  Your sexual activity has changed since you were last screened and you are at an increased risk for chlamydia or gonorrhea. Ask your health care provider if you are at risk.  If you do not have HIV, but are at risk, it may be recommended that you take a prescription medicine daily to prevent HIV infection. This is called pre-exposure prophylaxis (PrEP). You are considered at risk if:  You are sexually active  and do not regularly use condoms or know the HIV status of your partner(s).  You take drugs by injection.  You are sexually active with a partner who has HIV. Talk with your health care provider about whether you are at high risk of being infected with HIV. If you choose to begin PrEP, you should first be tested  for HIV. You should then be tested every 3 months for as long as you are taking PrEP.  PREGNANCY   If you are premenopausal and you may become pregnant, ask your health care provider about preconception counseling.  If you may become pregnant, take 400 to 800 micrograms (mcg) of folic acid every day.  If you want to prevent pregnancy, talk to your health care provider about birth control (contraception). OSTEOPOROSIS AND MENOPAUSE   Osteoporosis is a disease in which the bones lose minerals and strength with aging. This can result in serious bone fractures. Your risk for osteoporosis can be identified using a bone density scan.  If you are 23 years of age or older, or if you are at risk for osteoporosis and fractures, ask your health care provider if you should be screened.  Ask your health care provider whether you should take a calcium or vitamin D supplement to lower your risk for osteoporosis.  Menopause may have certain physical symptoms and risks.  Hormone replacement therapy may reduce some of these symptoms and risks. Talk to your health care provider about whether hormone replacement therapy is right for you.  HOME CARE INSTRUCTIONS   Schedule regular health, dental, and eye exams.  Stay current with your immunizations.   Do not use any tobacco products including cigarettes, chewing tobacco, or electronic cigarettes.  If you are pregnant, do not drink alcohol.  If you are breastfeeding, limit how much and how often you drink alcohol.  Limit alcohol intake to no more than 1 drink per day for nonpregnant women. One drink equals 12 ounces of beer, 5 ounces of wine,  or 1 ounces of hard liquor.  Do not use street drugs.  Do not share needles.  Ask your health care provider for help if you need support or information about quitting drugs.  Tell your health care provider if you often feel depressed.  Tell your health care provider if you have ever been abused or do not feel safe at home. Document Released: 08/04/2010 Document Revised: 06/05/2013 Document Reviewed: 12/21/2012 [facility] Patient Information 2015 [place], [place]. This information is not intended to replace advice given to you by your health care provider. Make sure you discuss any questions you have with your health care provider.

## 2015-02-05 LAB — COMPREHENSIVE METABOLIC PANEL
ALK PHOS: 59 U/L (ref 33–130)
ALT: 10 U/L (ref 6–29)
AST: 18 U/L (ref 10–35)
Albumin: 4 g/dL (ref 3.6–5.1)
BILIRUBIN TOTAL: 0.4 mg/dL (ref 0.2–1.2)
BUN: 20 mg/dL (ref 7–25)
CO2: 28 mmol/L (ref 20–31)
Calcium: 8.5 mg/dL — ABNORMAL LOW (ref 8.6–10.4)
Chloride: 104 mmol/L (ref 98–110)
Creat: 0.85 mg/dL (ref 0.50–1.05)
GLUCOSE: 79 mg/dL (ref 65–99)
Potassium: 3.9 mmol/L (ref 3.5–5.3)
Sodium: 138 mmol/L (ref 135–146)
Total Protein: 6.2 g/dL (ref 6.1–8.1)

## 2015-02-05 LAB — CBC WITH DIFFERENTIAL/PLATELET
BASOS ABS: 0 10*3/uL (ref 0.0–0.1)
Basophils Relative: 1 % (ref 0–1)
EOS PCT: 2 % (ref 0–5)
Eosinophils Absolute: 0.1 10*3/uL (ref 0.0–0.7)
HEMATOCRIT: 34.2 % — AB (ref 36.0–46.0)
Hemoglobin: 11.2 g/dL — ABNORMAL LOW (ref 12.0–15.0)
LYMPHS ABS: 1.3 10*3/uL (ref 0.7–4.0)
LYMPHS PCT: 27 % (ref 12–46)
MCH: 28.1 pg (ref 26.0–34.0)
MCHC: 32.7 g/dL (ref 30.0–36.0)
MCV: 85.9 fL (ref 78.0–100.0)
MONOS PCT: 10 % (ref 3–12)
MPV: 11.8 fL (ref 8.6–12.4)
Monocytes Absolute: 0.5 10*3/uL (ref 0.1–1.0)
Neutro Abs: 2.8 10*3/uL (ref 1.7–7.7)
Neutrophils Relative %: 60 % (ref 43–77)
Platelets: 206 10*3/uL (ref 150–400)
RBC: 3.98 MIL/uL (ref 3.87–5.11)
RDW: 13.6 % (ref 11.5–15.5)
WBC: 4.7 10*3/uL (ref 4.0–10.5)

## 2015-02-05 LAB — TSH: TSH: 2.062 u[IU]/mL (ref 0.350–4.500)

## 2015-02-05 LAB — LIPID PANEL
Cholesterol: 143 mg/dL (ref 125–200)
HDL: 25 mg/dL — ABNORMAL LOW (ref >=46)
LDL CALC: 82 mg/dL (ref <130)
Total CHOL/HDL Ratio: 5.7 Ratio — ABNORMAL HIGH (ref <=5.0)
Triglycerides: 180 mg/dL — ABNORMAL HIGH (ref <150)
VLDL: 36 mg/dL — ABNORMAL HIGH (ref <30)

## 2015-02-06 ENCOUNTER — Other Ambulatory Visit: Payer: Self-pay | Admitting: Gynecology

## 2015-02-06 DIAGNOSIS — E781 Pure hyperglyceridemia: Secondary | ICD-10-CM

## 2015-02-06 LAB — URINALYSIS W MICROSCOPIC + REFLEX CULTURE
Bacteria, UA: NONE SEEN [HPF]
Bilirubin Urine: NEGATIVE
Casts: NONE SEEN [LPF]
Crystals: NONE SEEN [HPF]
GLUCOSE, UA: NEGATIVE
Hgb urine dipstick: NEGATIVE
Ketones, ur: NEGATIVE
Leukocytes, UA: NEGATIVE
NITRITE: NEGATIVE
PH: 7 (ref 5.0–8.0)
Protein, ur: NEGATIVE
SPECIFIC GRAVITY, URINE: 1.02 (ref 1.001–1.035)
Yeast: NONE SEEN [HPF]

## 2015-02-06 LAB — VITAMIN D 25 HYDROXY (VIT D DEFICIENCY, FRACTURES): Vit D, 25-Hydroxy: 29 ng/mL — ABNORMAL LOW (ref 30–100)

## 2015-02-08 ENCOUNTER — Encounter: Payer: Self-pay | Admitting: Gynecology

## 2015-02-08 LAB — URINE CULTURE: Colony Count: >=100000

## 2015-02-10 ENCOUNTER — Encounter: Payer: Self-pay | Admitting: Gynecology

## 2015-02-11 ENCOUNTER — Other Ambulatory Visit: Payer: Self-pay | Admitting: Gynecology

## 2015-02-11 MED ORDER — MINIVELLE 0.1 MG/24HR TD PTTW: 1.0000 | MEDICATED_PATCH | TRANSDERMAL | Status: DC

## 2015-05-13 ENCOUNTER — Telehealth: Payer: Self-pay | Admitting: Gastroenterology

## 2015-05-13 MED ORDER — DEXLANSOPRAZOLE 60 MG PO CPDR: 60.0000 mg | DELAYED_RELEASE_CAPSULE | Freq: Every day | ORAL | Status: DC

## 2015-05-13 NOTE — Telephone Encounter (Signed)
Refill sent as requested. 

## 2015-06-03 DIAGNOSIS — M8580 Other specified disorders of bone density and structure, unspecified site: Secondary | ICD-10-CM

## 2015-06-03 HISTORY — DX: Other specified disorders of bone density and structure, unspecified site: M85.80

## 2015-06-21 ENCOUNTER — Encounter: Payer: Self-pay | Admitting: Gynecology

## 2015-06-28 ENCOUNTER — Encounter: Payer: Self-pay | Admitting: Gynecology

## 2015-07-03 ENCOUNTER — Encounter: Payer: Self-pay | Admitting: Gastroenterology

## 2015-07-03 ENCOUNTER — Ambulatory Visit (INDEPENDENT_AMBULATORY_CARE_PROVIDER_SITE_OTHER): Payer: BC Managed Care – PPO | Admitting: Gastroenterology

## 2015-07-03 VITALS — BP 118/70 | HR 84 | Ht 64.0 in | Wt 162.1 lb

## 2015-07-03 DIAGNOSIS — K900 Celiac disease: Secondary | ICD-10-CM | POA: Insufficient documentation

## 2015-07-03 DIAGNOSIS — K219 Gastro-esophageal reflux disease without esophagitis: Secondary | ICD-10-CM | POA: Diagnosis not present

## 2015-07-03 NOTE — Progress Notes (Signed)
     07/03/2015 Phyllis Morgan [id number] [birth year]   History of Present Illness:  This is a pleasant 55 year old female who is known to Dr. Ardis Hughs for treatment of her GERD. She is currently on Dexilant 60 mg daily, which works extremely well for her symptoms. She needed refills on her medication so was told that she needed an office visit since she had not been seen in over a year. She also has celiac disease, which is very well-controlled with dietary measures.  She is due for recall colonoscopy in November 2017 so will follow-up for that at that time.  No new symptoms or complaints.  Current Medications, Allergies, Past Medical History, Past Surgical History, Family History and Social History were reviewed in [employer] record.   Physical Exam: BP 118/70 mmHg  Pulse 84  Ht 5' 4"  (1.626 m)  Wt 162 lb 2 oz (73.539 kg)  BMI 27.81 kg/m2  LMP 10/10/2011 General: Well developed white female in no acute distress Head: Normocephalic and atraumatic Eyes:  Sclerae anicteric, conjunctiva pink  Ears: Normal auditory acuity Lungs: Clear throughout to auscultation Heart: Regular rate and rhythm Abdomen: Soft, non-distended.  Normal bowel sounds.  Non-tender. Musculoskeletal: Symmetrical with no gross deformities  Extremities: No edema  Neurological: Alert oriented x 4, grossly non-focal Psychological:  Alert and cooperative. Normal mood and affect  Assessment and Recommendations: -55 year old female with GERD:  Needed Dexilant refilled so was told that she needed an office visit.  Symptoms are well-controlled on this medication. She will continue this. -Celiac disease:  Well-controlled with dietary measures.

## 2015-07-03 NOTE — Patient Instructions (Signed)
You will be notified in October by mail to call our office to schedule the colonoscopy with Dr. Ardis Hughs.

## 2015-07-04 NOTE — Progress Notes (Signed)
i agree with the above note, plan 

## 2015-07-08 ENCOUNTER — Ambulatory Visit: Payer: BC Managed Care – PPO | Admitting: Gastroenterology

## 2015-08-31 ENCOUNTER — Encounter: Payer: Self-pay | Admitting: Gynecology

## 2015-09-05 ENCOUNTER — Other Ambulatory Visit: Payer: BC Managed Care – PPO

## 2015-09-05 DIAGNOSIS — E781 Pure hyperglyceridemia: Secondary | ICD-10-CM

## 2015-09-05 LAB — LIPID PANEL
CHOLESTEROL: 129 mg/dL (ref 125–200)
HDL: 33 mg/dL — AB (ref >=46)
LDL Cholesterol: 75 mg/dL (ref <130)
TRIGLYCERIDES: 106 mg/dL (ref <150)
Total CHOL/HDL Ratio: 3.9 Ratio (ref <=5.0)
VLDL: 21 mg/dL (ref <30)

## 2015-09-06 ENCOUNTER — Encounter: Payer: Self-pay | Admitting: Gynecology

## 2015-09-09 ENCOUNTER — Encounter: Payer: Self-pay | Admitting: Gynecology

## 2015-09-19 ENCOUNTER — Telehealth: Payer: Self-pay | Admitting: Gastroenterology

## 2015-09-19 NOTE — Telephone Encounter (Signed)
Pt has been complaining of a return of GERD symptoms for the past 2 weeks.  She is taking her dexilant as directed.  She states she is also following a strict gluten free diet.  Pt was added to Dr Ardis Hughs schedule 1st available is 12/06/15, she was also added to the wait list.  She was given reflux precautions and will add zantac at bedtime.  She will call if symptoms worsen.

## 2015-11-11 ENCOUNTER — Other Ambulatory Visit: Payer: Self-pay

## 2015-11-11 MED ORDER — MINIVELLE 0.1 MG/24HR TD PTTW: 1.0000 | MEDICATED_PATCH | TRANSDERMAL | 2 refills | Status: DC

## 2015-12-06 ENCOUNTER — Ambulatory Visit: Payer: BC Managed Care – PPO | Admitting: Gastroenterology

## 2015-12-11 ENCOUNTER — Encounter: Payer: Self-pay | Admitting: Gastroenterology

## 2015-12-17 ENCOUNTER — Encounter: Payer: Self-pay | Admitting: Gastroenterology

## 2016-01-23 ENCOUNTER — Ambulatory Visit: Payer: BC Managed Care – PPO | Admitting: *Deleted

## 2016-01-23 ENCOUNTER — Telehealth: Payer: Self-pay | Admitting: *Deleted

## 2016-01-23 VITALS — Ht 64.0 in | Wt 168.2 lb

## 2016-01-23 DIAGNOSIS — Z86010 Personal history of colonic polyps: Secondary | ICD-10-CM

## 2016-01-23 MED ORDER — NA SULFATE-K SULFATE-MG SULF 17.5-3.13-1.6 GM/177ML PO SOLN: 1.0000 | Freq: Once | ORAL | 0 refills | Status: AC

## 2016-01-23 NOTE — Progress Notes (Signed)
Denies allergies to eggs or soy products. Denies complications with sedation or anesthesia. Denies O2 use. Denies use of diet or weight loss medications.  Emmi instructions given for colonoscopy.  

## 2016-01-23 NOTE — Telephone Encounter (Signed)
Phone call to patient to inform her of Dr. Ardis Hughs recommendations.

## 2016-01-23 NOTE — Telephone Encounter (Signed)
No she does not need egd

## 2016-01-23 NOTE — Telephone Encounter (Signed)
Dr. Ardis Hughs,  This patient had a colonoscopy and endoscopy 12/2011 with Dr. Collene Mares. You saw her in 05/2014 and summarized results. She had gastric polyps at that time. She is scheduled for colonoscopy with you 02/14/16 and is inquiring if she should also have an endoscopy for further surveillance of gastric polyps.  Please advise. Thank you, Phyllis Morgan

## 2016-02-11 ENCOUNTER — Ambulatory Visit (INDEPENDENT_AMBULATORY_CARE_PROVIDER_SITE_OTHER): Payer: BC Managed Care – PPO | Admitting: Gynecology

## 2016-02-11 ENCOUNTER — Encounter: Payer: Self-pay | Admitting: Gynecology

## 2016-02-11 VITALS — BP 118/76 | Ht 64.0 in | Wt 168.0 lb

## 2016-02-11 DIAGNOSIS — N898 Other specified noninflammatory disorders of vagina: Secondary | ICD-10-CM

## 2016-02-11 DIAGNOSIS — B3731 Acute candidiasis of vulva and vagina: Secondary | ICD-10-CM

## 2016-02-11 DIAGNOSIS — B373 Candidiasis of vulva and vagina: Secondary | ICD-10-CM | POA: Diagnosis not present

## 2016-02-11 DIAGNOSIS — Z1329 Encounter for screening for other suspected endocrine disorder: Secondary | ICD-10-CM

## 2016-02-11 DIAGNOSIS — Z01411 Encounter for gynecological examination (general) (routine) with abnormal findings: Secondary | ICD-10-CM | POA: Diagnosis not present

## 2016-02-11 DIAGNOSIS — Z79890 Hormone replacement therapy (postmenopausal): Secondary | ICD-10-CM

## 2016-02-11 DIAGNOSIS — N952 Postmenopausal atrophic vaginitis: Secondary | ICD-10-CM | POA: Diagnosis not present

## 2016-02-11 DIAGNOSIS — L298 Other pruritus: Secondary | ICD-10-CM

## 2016-02-11 DIAGNOSIS — M8580 Other specified disorders of bone density and structure, unspecified site: Secondary | ICD-10-CM

## 2016-02-11 LAB — URINALYSIS W MICROSCOPIC + REFLEX CULTURE
Bacteria, UA: NONE SEEN [HPF]
Bilirubin Urine: NEGATIVE
CASTS: NONE SEEN [LPF]
Crystals: NONE SEEN [HPF]
GLUCOSE, UA: NEGATIVE
Hgb urine dipstick: NEGATIVE
Ketones, ur: NEGATIVE
LEUKOCYTES UA: NEGATIVE
NITRITE: NEGATIVE
PH: 7 (ref 5.0–8.0)
Protein, ur: NEGATIVE
RBC / HPF: NONE SEEN RBC/HPF (ref <=2)
SPECIFIC GRAVITY, URINE: 1.017 (ref 1.001–1.035)
WBC, UA: NONE SEEN WBC/HPF (ref <=5)
YEAST: NONE SEEN [HPF]

## 2016-02-11 LAB — CBC WITH DIFFERENTIAL/PLATELET
BASOS PCT: 1 %
Basophils Absolute: 53 cells/uL (ref 0–200)
EOS ABS: 53 {cells}/uL (ref 15–500)
Eosinophils Relative: 1 %
HEMATOCRIT: 34.4 % — AB (ref 35.0–45.0)
HEMOGLOBIN: 11.1 g/dL — AB (ref 11.7–15.5)
LYMPHS ABS: 1166 {cells}/uL (ref 850–3900)
Lymphocytes Relative: 22 %
MCH: 26.3 pg — ABNORMAL LOW (ref 27.0–33.0)
MCHC: 32.3 g/dL (ref 32.0–36.0)
MCV: 81.5 fL (ref 80.0–100.0)
MONO ABS: 424 {cells}/uL (ref 200–950)
MPV: 11.7 fL (ref 7.5–12.5)
Monocytes Relative: 8 %
NEUTROS ABS: 3604 {cells}/uL (ref 1500–7800)
Neutrophils Relative %: 68 %
Platelets: 205 10*3/uL (ref 140–400)
RBC: 4.22 MIL/uL (ref 3.80–5.10)
RDW: 16.1 % — ABNORMAL HIGH (ref 11.0–15.0)
WBC: 5.3 10*3/uL (ref 3.8–10.8)

## 2016-02-11 LAB — COMPREHENSIVE METABOLIC PANEL
ALK PHOS: 58 U/L (ref 33–130)
ALT: 10 U/L (ref 6–29)
AST: 17 U/L (ref 10–35)
Albumin: 3.9 g/dL (ref 3.6–5.1)
BILIRUBIN TOTAL: 0.4 mg/dL (ref 0.2–1.2)
BUN: 20 mg/dL (ref 7–25)
CALCIUM: 8.7 mg/dL (ref 8.6–10.4)
CO2: 24 mmol/L (ref 20–31)
Chloride: 105 mmol/L (ref 98–110)
Creat: 0.96 mg/dL (ref 0.50–1.05)
Glucose, Bld: 83 mg/dL (ref 65–99)
POTASSIUM: 4.1 mmol/L (ref 3.5–5.3)
Sodium: 138 mmol/L (ref 135–146)
TOTAL PROTEIN: 6.4 g/dL (ref 6.1–8.1)

## 2016-02-11 LAB — TSH: TSH: 1.65 m[IU]/L

## 2016-02-11 LAB — WET PREP FOR TRICH, YEAST, CLUE
CLUE CELLS WET PREP: NONE SEEN
TRICH WET PREP: NONE SEEN

## 2016-02-11 MED ORDER — MINIVELLE 0.1 MG/24HR TD PTTW: 1.0000 | MEDICATED_PATCH | TRANSDERMAL | 11 refills | Status: DC

## 2016-02-11 MED ORDER — PROGESTERONE MICRONIZED 100 MG PO CAPS: ORAL_CAPSULE | ORAL | 11 refills | Status: DC

## 2016-02-11 MED ORDER — FLUCONAZOLE 150 MG PO TABS: 150.0000 mg | ORAL_TABLET | Freq: Once | ORAL | 0 refills | Status: AC

## 2016-02-11 NOTE — Patient Instructions (Signed)

## 2016-02-11 NOTE — Progress Notes (Signed)
    Phyllis Morgan [birth year] [id number]        56 y.o.  [id number] for annual exam.  Patient also is complaining of vaginal itching and slight irritation. Mild discharge. No odor. Finished a three-week course of antibiotics for sinusitis. No urinary symptoms such as dysuria or frequency urgency low back pain fever or chills. No nausea vomiting diarrhea constipation.  Past medical history,surgical history, problem list, medications, allergies, family history and social history were all reviewed and documented as reviewed in the EPIC chart.  ROS:  Performed with pertinent positives and negatives included in the history, assessment and plan.   Additional significant findings :  None   Exam: Caryn Bee assistant Vitals:   02/11/16 0850  BP: 118/76  Weight: 168 lb (76.2 kg)  Height: '5\' 4"'$  (1.626 m)   Body mass index is 28.84 kg/m.  General appearance:  Normal affect, orientation and appearance. Skin: Grossly normal HEENT: Without gross lesions.  No cervical or supraclavicular adenopathy. Thyroid normal.  Lungs:  Clear without wheezing, rales or rhonchi Cardiac: RR, without RMG Abdominal:  Soft, nontender, without masses, guarding, rebound, organomegaly or hernia Breasts:  Examined lying and sitting without masses, retractions, discharge or axillary adenopathy. Pelvic:  Ext, BUS, Vagina with slight white discharge. Mild vaginal atrophy  Cervix normal  Uterus anteverted, normal size, shape and contour, midline and mobile nontender   Adnexa without masses or tenderness    Anus and perineum normal   Rectovaginal normal sphincter tone without palpated masses or tenderness.    Assessment/Plan:  56 y.o. [id number] female for annual exam.   1. Vaginal irritation with slight discharge 2. Postmenopausal/atrophic genital changes/HRT. Continues on minivelle 0.1 mg patches and Prometrium 100 mg nightly. Doing well without complaints. No bleeding. I reviewed the most current 2017 names guidelines  with her to include risks of stroke heart attack DVT and breast cancer. Benefits of symptom relief and possible cardiovascular/bone health when started early also reviewed. At this point the patient's wants to continue and I refilled 1 year. Call if any bleeding. 3. Osteopenia. DEXA 06/2015 T score -1.2. Stable from prior DEXA. Check vitamin D level today. Plan repeat in several years. 4. Pap smear/HPV 2015 negative. No Pap smear done today. No history of abnormal Pap smears. Plan repeat Pap smear at 5 year interval per current screening guidelines. 5. Colonoscopy 2013. Repeat at their recommended interval. 6. Health maintenance. Baseline CBC, CMP, TSH, vitamin D, urinalysis ordered. Lipid profile in August showed decreased HDL at 33 but normal cholesterol 129 and LDL 75. Follow up in one year, sooner as needed.   Anastasio Auerbach MD, 9:09 AM 02/11/2016

## 2016-02-12 LAB — VITAMIN D 25 HYDROXY (VIT D DEFICIENCY, FRACTURES): Vit D, 25-Hydroxy: 30 ng/mL (ref 30–100)

## 2016-02-13 ENCOUNTER — Other Ambulatory Visit: Payer: Self-pay | Admitting: Gynecology

## 2016-02-13 DIAGNOSIS — E559 Vitamin D deficiency, unspecified: Secondary | ICD-10-CM

## 2016-02-13 DIAGNOSIS — D649 Anemia, unspecified: Secondary | ICD-10-CM

## 2016-02-14 ENCOUNTER — Encounter: Payer: Self-pay | Admitting: Gastroenterology

## 2016-02-14 ENCOUNTER — Ambulatory Visit (AMBULATORY_SURGERY_CENTER): Payer: BC Managed Care – PPO | Admitting: Gastroenterology

## 2016-02-14 VITALS — BP 90/38 | HR 67 | Temp 97.7°F | Resp 11 | Ht 64.0 in | Wt 168.0 lb

## 2016-02-14 DIAGNOSIS — Z86010 Personal history of colonic polyps: Secondary | ICD-10-CM | POA: Diagnosis not present

## 2016-02-14 DIAGNOSIS — D123 Benign neoplasm of transverse colon: Secondary | ICD-10-CM | POA: Diagnosis not present

## 2016-02-14 DIAGNOSIS — D126 Benign neoplasm of colon, unspecified: Secondary | ICD-10-CM | POA: Diagnosis not present

## 2016-02-14 MED ORDER — SODIUM CHLORIDE 0.9 % IV SOLN: 500.0000 mL | INTRAVENOUS | Status: DC

## 2016-02-14 NOTE — Progress Notes (Signed)
Patient awakening,vss,report to rn 

## 2016-02-14 NOTE — Patient Instructions (Signed)
Impression/recommendations:  Polyps (handout given)  Repeat colonoscopy pending pathology results.  YOU HAD AN ENDOSCOPIC PROCEDURE TODAY AT [facility]:   Refer to the procedure report that was given to you for any specific questions about what was found during the examination.  If the procedure report does not answer your questions, please call your gastroenterologist to clarify.  If you requested that your care partner not be given the details of your procedure findings, then the procedure report has been included in a sealed envelope for you to review at your convenience later.  YOU SHOULD EXPECT: Some feelings of bloating in the abdomen. Passage of more gas than usual.  Walking can help get rid of the air that was put into your GI tract during the procedure and reduce the bloating. If you had a lower endoscopy (such as a colonoscopy or flexible sigmoidoscopy) you may notice spotting of blood in your stool or on the toilet paper. If you underwent a bowel prep for your procedure, you may not have a normal bowel movement for a few days.  Please Note:  You might notice some irritation and congestion in your nose or some drainage.  This is from the oxygen used during your procedure.  There is no need for concern and it should clear up in a day or so.  SYMPTOMS TO REPORT IMMEDIATELY:   Following lower endoscopy (colonoscopy or flexible sigmoidoscopy):  Excessive amounts of blood in the stool  Significant tenderness or worsening of abdominal pains  Swelling of the abdomen that is new, acute  Fever of 100F or higher  For urgent or emergent issues, a gastroenterologist can be reached at any hour by calling [phone].   DIET:  We do recommend a small meal at first, but then you may proceed to your regular diet.  Drink plenty of fluids but you should avoid alcoholic beverages for 24 hours.  ACTIVITY:  You should plan to take it easy for the rest of today and you should NOT  DRIVE or use heavy machinery until tomorrow (because of the sedation medicines used during the test).    FOLLOW UP: Our staff will call the number listed on your records the next business day following your procedure to check on you and address any questions or concerns that you may have regarding the information given to you following your procedure. If we do not reach you, we will leave a message.  However, if you are feeling well and you are not experiencing any problems, there is no need to return our call.  We will assume that you have returned to your regular daily activities without incident.  If any biopsies were taken you will be contacted by phone or by letter within the next 1-3 weeks.  Please call us at [phone] if you have not heard about the biopsies in 3 weeks.    SIGNATURES/CONFIDENTIALITY: You and/or your care partner have signed paperwork which will be entered into your electronic medical record.  These signatures attest to the fact that that the information above on your After Visit Summary has been reviewed and is understood.  Full responsibility of the confidentiality of this discharge information lies with you and/or your care-partner.

## 2016-02-14 NOTE — Progress Notes (Signed)
Called to room to assist during endoscopic procedure.  Patient ID and intended procedure confirmed with present staff. Received instructions for my participation in the procedure from the performing physician.  

## 2016-02-14 NOTE — Op Note (Signed)
[place] Patient Name: Phyllis Morgan Procedure Date: 02/14/2016 2:14 PM MRN: [id number] Endoscopist: Milus Banister , MD Age: 56 Referring MD:  Date of Birth: [birth year] Gender: Female Account #: [id number] Procedure:                Colonoscopy Indications:              High risk colon cancer surveillance: Personal                            history of colonic polyps; colonoscopy Dr. Collene Mares 2013 Medicines:                Monitored Anesthesia Care Procedure:                Pre-Anesthesia Assessment:                           - Prior to the procedure, a History and Physical                            was performed, and patient medications and                            allergies were reviewed. The patient's tolerance of                            previous anesthesia was also reviewed. The risks                            and benefits of the procedure and the sedation                            options and risks were discussed with the patient.                            All questions were answered, and informed consent                            was obtained. Prior Anticoagulants: The patient has                            taken no previous anticoagulant or antiplatelet                            agents. ASA Grade Assessment: II - A patient with                            mild systemic disease. After reviewing the risks                            and benefits, the patient was deemed in                            satisfactory condition to undergo the procedure.  After obtaining informed consent, the colonoscope                            was passed under direct vision. Throughout the                            procedure, the patient's blood pressure, pulse, and                            oxygen saturations were monitored continuously. The                            Model CF-H180AL [id number]) scope was introduced                            through the  anus and advanced to the the cecum,                            identified by appendiceal orifice and ileocecal                            valve. The colonoscopy was performed without                            difficulty. The patient tolerated the procedure                            well. The quality of the bowel preparation was                            excellent. The ileocecal valve, appendiceal                            orifice, and rectum were photographed. Scope In: 2:24:32 PM Scope Out: 2:36:39 PM Scope Withdrawal Time: 0 hours 10 minutes 2 seconds  Total Procedure Duration: 0 hours 12 minutes 7 seconds  Findings:                 Two sessile polyps were found in the transverse                            colon. The polyps were 2 to 4 mm in size. These                            polyps were removed with a cold snare. Resection                            and retrieval were complete.                           The exam was otherwise without abnormality on                            direct and retroflexion views. Complications:  No immediate complications. Estimated blood loss:                            None. Estimated Blood Loss:     Estimated blood loss: none. Impression:               - Two 2 to 4 mm polyps in the transverse colon,                            removed with a cold snare. Resected and retrieved.                           - The examination was otherwise normal on direct                            and retroflexion views. Recommendation:           - Patient has a contact number available for                            emergencies. The signs and symptoms of potential                            delayed complications were discussed with the                            patient. Return to normal activities tomorrow.                            Written discharge instructions were provided to the                            patient.                           - Resume  previous diet.                           - Continue present medications.                           You will receive a letter within 2-3 weeks with the                            pathology results and my final recommendations.                           If the polyp(s) is proven to be 'pre-cancerous' on                            pathology, you will need repeat colonoscopy in 5                            years. Milus Banister, MD 02/14/2016 2:39:42 PM This report has been signed electronically.

## 2016-02-17 ENCOUNTER — Telehealth: Payer: Self-pay

## 2016-02-17 NOTE — Telephone Encounter (Signed)
  Follow up Call-  Call back number 02/14/2016  Post procedure Call Back phone  # [phone]  Permission to leave phone message Yes  Some recent data might be hidden     Patient questions:  Do you have a fever, pain , or abdominal swelling? No. Pain Score  0 *  Have you tolerated food without any problems? Yes.    Have you been able to return to your normal activities? Yes.    Do you have any questions about your discharge instructions: Diet   No. Medications  No. Follow up visit  No.  Do you have questions or concerns about your Care? No.  Actions: * If pain score is 4 or above: No action needed, pain <4.

## 2016-02-20 ENCOUNTER — Encounter: Payer: Self-pay | Admitting: Gastroenterology

## 2016-02-23 ENCOUNTER — Encounter: Payer: Self-pay | Admitting: Gynecology

## 2016-02-24 MED ORDER — TERCONAZOLE 0.8 % VA CREA: 1.0000 | TOPICAL_CREAM | Freq: Every day | VAGINAL | 0 refills | Status: DC

## 2016-02-24 NOTE — Telephone Encounter (Signed)
Okay for Terazol 3 day cream 1 applicator at bedtime 3 days.

## 2016-02-24 NOTE — Telephone Encounter (Signed)
Some yeast infections are more resistant to the pills. If she has taken Diflucan and persists with symptoms then I think the yeast cream which is a higher level cream although more messy has a much higher likelihood of clearing her symptoms.

## 2016-04-16 ENCOUNTER — Encounter: Payer: Self-pay | Admitting: Gastroenterology

## 2016-04-17 ENCOUNTER — Encounter: Payer: Self-pay | Admitting: Gynecology

## 2016-04-17 ENCOUNTER — Other Ambulatory Visit: Payer: Self-pay | Admitting: Gynecology

## 2016-04-17 MED ORDER — DEXLANSOPRAZOLE 60 MG PO CPDR: 60.0000 mg | DELAYED_RELEASE_CAPSULE | Freq: Every day | ORAL | 3 refills | Status: DC

## 2016-04-17 MED ORDER — PROGESTERONE MICRONIZED 100 MG PO CAPS: ORAL_CAPSULE | ORAL | 3 refills | Status: DC

## 2016-04-17 MED ORDER — MINIVELLE 0.1 MG/24HR TD PTTW: 1.0000 | MEDICATED_PATCH | TRANSDERMAL | 3 refills | Status: DC

## 2016-07-29 ENCOUNTER — Encounter: Payer: Self-pay | Admitting: Gynecology

## 2016-09-25 ENCOUNTER — Other Ambulatory Visit: Payer: BC Managed Care – PPO

## 2016-09-25 DIAGNOSIS — E559 Vitamin D deficiency, unspecified: Secondary | ICD-10-CM

## 2016-09-25 DIAGNOSIS — D649 Anemia, unspecified: Secondary | ICD-10-CM

## 2016-09-25 LAB — CBC WITH DIFFERENTIAL/PLATELET
BASOS PCT: 1 %
Basophils Absolute: 48 cells/uL (ref 0–200)
EOS ABS: 144 {cells}/uL (ref 15–500)
Eosinophils Relative: 3 %
HEMATOCRIT: 37.6 % (ref 35.0–45.0)
HEMOGLOBIN: 12.1 g/dL (ref 11.7–15.5)
LYMPHS ABS: 1488 {cells}/uL (ref 850–3900)
LYMPHS PCT: 31 %
MCH: 28.6 pg (ref 27.0–33.0)
MCHC: 32.2 g/dL (ref 32.0–36.0)
MCV: 88.9 fL (ref 80.0–100.0)
MONO ABS: 336 {cells}/uL (ref 200–950)
MPV: 11.4 fL (ref 7.5–12.5)
Monocytes Relative: 7 %
Neutro Abs: 2784 cells/uL (ref 1500–7800)
Neutrophils Relative %: 58 %
Platelets: 208 10*3/uL (ref 140–400)
RBC: 4.23 MIL/uL (ref 3.80–5.10)
RDW: 14.7 % (ref 11.0–15.0)
WBC: 4.8 10*3/uL (ref 3.8–10.8)

## 2016-09-26 LAB — VITAMIN D 25 HYDROXY (VIT D DEFICIENCY, FRACTURES): Vit D, 25-Hydroxy: 37 ng/mL (ref 30–100)

## 2017-02-02 HISTORY — PX: KNEE SURGERY: SHX244

## 2017-04-08 ENCOUNTER — Encounter: Payer: BC Managed Care – PPO | Admitting: Gynecology

## 2017-04-08 ENCOUNTER — Other Ambulatory Visit: Payer: Self-pay | Admitting: Gastroenterology

## 2017-04-14 ENCOUNTER — Ambulatory Visit: Payer: BC Managed Care – PPO | Admitting: Gynecology

## 2017-04-14 ENCOUNTER — Encounter: Payer: Self-pay | Admitting: Gynecology

## 2017-04-14 VITALS — BP 118/74 | Ht 65.0 in | Wt 161.0 lb

## 2017-04-14 DIAGNOSIS — Z01411 Encounter for gynecological examination (general) (routine) with abnormal findings: Secondary | ICD-10-CM

## 2017-04-14 DIAGNOSIS — M8580 Other specified disorders of bone density and structure, unspecified site: Secondary | ICD-10-CM | POA: Diagnosis not present

## 2017-04-14 DIAGNOSIS — Z79890 Hormone replacement therapy (postmenopausal): Secondary | ICD-10-CM

## 2017-04-14 DIAGNOSIS — Z1159 Encounter for screening for other viral diseases: Secondary | ICD-10-CM

## 2017-04-14 DIAGNOSIS — E559 Vitamin D deficiency, unspecified: Secondary | ICD-10-CM

## 2017-04-14 DIAGNOSIS — N952 Postmenopausal atrophic vaginitis: Secondary | ICD-10-CM

## 2017-04-14 DIAGNOSIS — Z13220 Encounter for screening for lipoid disorders: Secondary | ICD-10-CM

## 2017-04-14 MED ORDER — PROGESTERONE MICRONIZED 100 MG PO CAPS: ORAL_CAPSULE | ORAL | 4 refills | Status: DC

## 2017-04-14 MED ORDER — MINIVELLE 0.1 MG/24HR TD PTTW: 1.0000 | MEDICATED_PATCH | TRANSDERMAL | 4 refills | Status: DC

## 2017-04-14 NOTE — Progress Notes (Signed)
    Phyllis Morgan [birth year] [id number]        56 y.o.  M2T1173 for annual gynecologic exam.  Several issues noted below.  Past medical history,surgical history, problem list, medications, allergies, family history and social history were all reviewed and documented as reviewed in the EPIC chart.  ROS:  Performed with pertinent positives and negatives included in the history, assessment and plan.   Additional significant findings : None   Exam: Caryn Bee assistant Vitals:   04/14/17 1047  BP: 118/74  Weight: 161 lb (73 kg)  Height: 5\' 5"  (1.651 m)   Body mass index is 26.79 kg/m.  General appearance:  Normal affect, orientation and appearance. Skin: Grossly normal HEENT: Without gross lesions.  No cervical or supraclavicular adenopathy. Thyroid normal.  Lungs:  Clear without wheezing, rales or rhonchi Cardiac: RR, without RMG Abdominal:  Soft, nontender, without masses, guarding, rebound, organomegaly or hernia Breasts:  Examined lying and sitting without masses, retractions, discharge or axillary adenopathy. Pelvic:  Ext, BUS, Vagina: With atrophic changes  Cervix: Mild atrophic changes  Uterus: Anteverted, normal size, shape and contour, midline and mobile nontender   Adnexa: Without masses or tenderness    Anus and perineum: Normal   Rectovaginal: Normal sphincter tone without palpated masses or tenderness.    Assessment/Plan:  57 y.o. V6P0141 female for annual gynecologic exam.   1. Postmenopausal/atrophic genital changes.  Continues on minivelle 1 mg patch and Prometrium 100 mg at bedtime.  Feels good on this and wants to continue.  No bleeding.  We again reviewed the issues and risks of HRT to include thrombosis such as stroke heart attack DVT as well as the breast cancer issue.  Benefits from his symptom relief as well as cardiovascular and bone health discussed.  She is being followed for osteopenia.  At this point the patient wants to continue and I refilled her  times 1 year.  Call if any bleeding. 2. Osteopenia.  DEXA 2017 T score -1.2 stable from prior DEXA.  History of low vitamin D.  Check vitamin D level today.  Plan repeat DEXA next year at 3-year interval. 3. Pap smear/HPV 2015.  No Pap smear done today.  No history of significant abnormal Pap smears.  Plan repeat Pap smear next year at 5-year interval per current screening guidelines. 4. Colonoscopy 2018.  Repeat at their recommended interval. 5. Mammography 07/2016.  Repeat mammogram this coming July.  Breast exam normal today. 6. Health maintenance.  Baseline CBC, CMP, lipid profile, vitamin D ordered today.  She also asked to have a hepatitis C screen as she is in the age group recommended for screening by the CDC.  Follow-up in 1 year, sooner as needed.   Anastasio Auerbach MD, 11:44 AM 04/14/2017

## 2017-04-14 NOTE — Patient Instructions (Signed)
Follow-up in 1 year for annual exam, sooner if any issues. 

## 2017-04-15 LAB — LIPID PANEL
CHOL/HDL RATIO: 4.7 (calc) (ref <5.0)
Cholesterol: 154 mg/dL (ref <200)
HDL: 33 mg/dL — ABNORMAL LOW (ref >50)
LDL Cholesterol (Calc): 99 mg/dL (calc)
NON-HDL CHOLESTEROL (CALC): 121 mg/dL (ref <130)
Triglycerides: 123 mg/dL (ref <150)

## 2017-04-15 LAB — CBC WITH DIFFERENTIAL/PLATELET
Basophils Absolute: 52 cells/uL (ref 0–200)
Basophils Relative: 1.1 %
Eosinophils Absolute: 89 cells/uL (ref 15–500)
Eosinophils Relative: 1.9 %
HCT: 33.5 % — ABNORMAL LOW (ref 35.0–45.0)
Hemoglobin: 11.2 g/dL — ABNORMAL LOW (ref 11.7–15.5)
Lymphs Abs: 1189 cells/uL (ref 850–3900)
MCH: 29 pg (ref 27.0–33.0)
MCHC: 33.4 g/dL (ref 32.0–36.0)
MCV: 86.8 fL (ref 80.0–100.0)
MPV: 12.2 fL (ref 7.5–12.5)
Monocytes Relative: 6.6 %
Neutro Abs: 3060 cells/uL (ref 1500–7800)
Neutrophils Relative %: 65.1 %
Platelets: 230 10*3/uL (ref 140–400)
RBC: 3.86 10*6/uL (ref 3.80–5.10)
RDW: 12.3 % (ref 11.0–15.0)
Total Lymphocyte: 25.3 %
WBC mixed population: 310 cells/uL (ref 200–950)
WBC: 4.7 10*3/uL (ref 3.8–10.8)

## 2017-04-15 LAB — COMPREHENSIVE METABOLIC PANEL
AG RATIO: 1.8 (calc) (ref 1.0–2.5)
ALBUMIN MSPROF: 4.1 g/dL (ref 3.6–5.1)
ALT: 10 U/L (ref 6–29)
AST: 18 U/L (ref 10–35)
Alkaline phosphatase (APISO): 53 U/L (ref 33–130)
BUN: 17 mg/dL (ref 7–25)
CHLORIDE: 106 mmol/L (ref 98–110)
CO2: 28 mmol/L (ref 20–32)
CREATININE: 0.91 mg/dL (ref 0.50–1.05)
Calcium: 9.1 mg/dL (ref 8.6–10.4)
GLOBULIN: 2.3 g/dL (ref 1.9–3.7)
GLUCOSE: 76 mg/dL (ref 65–99)
POTASSIUM: 4.2 mmol/L (ref 3.5–5.3)
SODIUM: 139 mmol/L (ref 135–146)
TOTAL PROTEIN: 6.4 g/dL (ref 6.1–8.1)
Total Bilirubin: 0.5 mg/dL (ref 0.2–1.2)

## 2017-04-15 LAB — VITAMIN D 25 HYDROXY (VIT D DEFICIENCY, FRACTURES): VIT D 25 HYDROXY: 42 ng/mL (ref 30–100)

## 2017-04-15 LAB — HEPATITIS C ANTIBODY
HEP C AB: NONREACTIVE
SIGNAL TO CUT-OFF: 0.02 (ref <1.00)

## 2017-07-01 ENCOUNTER — Other Ambulatory Visit: Payer: Self-pay | Admitting: Gastroenterology

## 2017-07-08 ENCOUNTER — Encounter: Payer: Self-pay | Admitting: Gynecology

## 2017-07-08 NOTE — Telephone Encounter (Signed)
We do not carry any samples.  I am not aware of any issues with the minivelle but then again I am not in their loop.  She could try the Vivelle patch if those are available or switch to oral

## 2017-08-31 ENCOUNTER — Encounter: Payer: Self-pay | Admitting: Gynecology

## 2017-10-05 ENCOUNTER — Other Ambulatory Visit: Payer: Self-pay | Admitting: Gastroenterology

## 2017-10-27 ENCOUNTER — Other Ambulatory Visit (INDEPENDENT_AMBULATORY_CARE_PROVIDER_SITE_OTHER): Payer: BC Managed Care – PPO

## 2017-10-27 ENCOUNTER — Ambulatory Visit: Payer: BC Managed Care – PPO | Admitting: Physician Assistant

## 2017-10-27 ENCOUNTER — Encounter: Payer: Self-pay | Admitting: Physician Assistant

## 2017-10-27 VITALS — BP 116/80 | HR 80 | Ht 64.0 in | Wt 163.0 lb

## 2017-10-27 DIAGNOSIS — R1013 Epigastric pain: Secondary | ICD-10-CM

## 2017-10-27 DIAGNOSIS — K219 Gastro-esophageal reflux disease without esophagitis: Secondary | ICD-10-CM

## 2017-10-27 DIAGNOSIS — K900 Celiac disease: Secondary | ICD-10-CM

## 2017-10-27 LAB — CBC WITH DIFFERENTIAL/PLATELET
Basophils Absolute: 0.1 K/uL (ref 0.0–0.1)
Basophils Relative: 1 % (ref 0.0–3.0)
Eosinophils Absolute: 0.1 K/uL (ref 0.0–0.7)
Eosinophils Relative: 1.2 % (ref 0.0–5.0)
HCT: 37.3 % (ref 36.0–46.0)
Hemoglobin: 12.4 g/dL (ref 12.0–15.0)
Lymphocytes Relative: 24.4 % (ref 12.0–46.0)
Lymphs Abs: 1.9 K/uL (ref 0.7–4.0)
MCHC: 33.3 g/dL (ref 30.0–36.0)
MCV: 85.7 fl (ref 78.0–100.0)
Monocytes Absolute: 0.5 K/uL (ref 0.1–1.0)
Monocytes Relative: 6.6 % (ref 3.0–12.0)
Neutro Abs: 5.1 K/uL (ref 1.4–7.7)
Neutrophils Relative %: 66.8 % (ref 43.0–77.0)
Platelets: 242 K/uL (ref 150.0–400.0)
RBC: 4.36 Mil/uL (ref 3.87–5.11)
RDW: 14.8 % (ref 11.5–15.5)
WBC: 7.6 K/uL (ref 4.0–10.5)

## 2017-10-27 LAB — COMPREHENSIVE METABOLIC PANEL
ALK PHOS: 74 U/L (ref 39–117)
ALT: 12 U/L (ref 0–35)
AST: 15 U/L (ref 0–37)
Albumin: 4.1 g/dL (ref 3.5–5.2)
BILIRUBIN TOTAL: 0.3 mg/dL (ref 0.2–1.2)
BUN: 17 mg/dL (ref 6–23)
CALCIUM: 8.9 mg/dL (ref 8.4–10.5)
CO2: 29 mEq/L (ref 19–32)
Chloride: 104 mEq/L (ref 96–112)
Creatinine, Ser: 0.88 mg/dL (ref 0.40–1.20)
GFR: 70.8 mL/min (ref >60.00)
GLUCOSE: 122 mg/dL — AB (ref 70–99)
POTASSIUM: 3.8 meq/L (ref 3.5–5.1)
SODIUM: 139 meq/L (ref 135–145)
TOTAL PROTEIN: 7.1 g/dL (ref 6.0–8.3)

## 2017-10-27 LAB — SEDIMENTATION RATE: SED RATE: 13 mm/h (ref 0–30)

## 2017-10-27 MED ORDER — SUCRALFATE 1 G PO TABS: ORAL_TABLET | ORAL | 0 refills | Status: DC

## 2017-10-27 MED ORDER — DEXLANSOPRAZOLE 60 MG PO CPDR: DELAYED_RELEASE_CAPSULE | ORAL | 11 refills | Status: DC

## 2017-10-27 NOTE — Patient Instructions (Addendum)
Your provider has requested that you go to the basement level for lab work before leaving today. Press "B" on the elevator. The lab is located at the first door on the left as you exit the elevator.  Continue Dexliant 60 mg, take 1 tablet every morning.We sent refills to your pharmacy. We also sent a prescription for Carafate 1 gram tablets, take between meals and at bedtime.  You have been scheduled for an endoscopy. Please follow written instructions given to you at your visit today. If you use inhalers (even only as needed), please bring them with you on the day of your procedure. Your physician has requested that you go to www.startemmi.com and enter the access code given to you at your visit today. This web site gives a general overview about your procedure. However, you should still follow specific instructions given to you by our office regarding your preparation for the procedure.

## 2017-10-27 NOTE — Progress Notes (Addendum)
Subjective:    Patient ID: Phyllis Morgan, female    DOB: 1960/10/21, 57 y.o.   MRN: [id number]  HPI Phyllis Morgan is a pleasant 57 year old female, known to Dr. Ardis Hughs and last seen in January 2018 when she had colonoscopy for follow-up of personal history of adenomatous polyps.  She had 2 small polyps removed one was a tubular adenoma and one a sessile serrated adenoma. She had very remote EGD in 2001 that was normal and patient reports EGD done about 6 years ago as well as colonoscopy per Dr. Juanita Craver  She was having epigastric pain at that time and also had suspected celiac disease.  She says the endoscopy confirmed celiac disease and she may have had some inflammation but no ulcer. She has had chronic GERD and has been on Dexilant 60 mg p.o. daily fairly long-term.  She says it works well but if she even misses 1 dose she will have fairly quick onset of dyspepsia and burning.   She comes in today saying that she is been having some epigastric/right upper quadrant pain off and on over the past few months so describes a "weird heavy" sensation that she feels in the epigastrium at times.  No associated nausea or vomiting.  Appetite has been good weight has been stable.  He does not feel that eating aggravates her symptoms necessarily.  She has noted a bit more frequent bowel movements now having 1-2 bowel movements per day.  No melena or hematochezia. She is on no regular aspirin or NSAIDs, no new medications. She says she is very strict about maintaining a gluten-free diet.  Review of Systems ;Pertinent positive and negative review of systems were noted in the above HPI section.  All other review of systems was otherwise negative.  Outpatient Encounter Medications as of 10/27/2017  Medication Sig  . albuterol (PROVENTIL HFA;VENTOLIN HFA) 108 (90 Base) MCG/ACT inhaler Inhale 2 puffs into the lungs every 6 (six) hours as needed for wheezing or shortness of breath.  . Cetirizine HCl (ZYRTEC PO) Take by  mouth.    . Cholecalciferol (VITAMIN D PO) Take by mouth.  . dexlansoprazole (DEXILANT) 60 MG capsule Take 1 tablet every morning.  . fluticasone (FLONASE) 50 MCG/ACT nasal spray Place 2 sprays into the nose daily.  . GuaiFENesin (MUCINEX PO) Take by mouth.    Marland Kitchen MINIVELLE 0.1 MG/24HR patch Place 1 patch (0.1 mg total) onto the skin 2 (two) times a week.  . Multiple Vitamin (MULTI-VITAMIN PO) Take by mouth.  . progesterone (PROMETRIUM) 100 MG capsule TAKE 1 CAPSULE BY MOUTH QHS  . [DISCONTINUED] DEXILANT 60 MG capsule TAKE 1 CAPSULE(60 MG) BY MOUTH DAILY  . sucralfate (CARAFATE) 1 g tablet Take 1 tablet between meals and at bedtime.   Facility-Administered Encounter Medications as of 10/27/2017  Medication  . 0.9 %  sodium chloride infusion   Allergies  Allergen Reactions  . Avelox [Moxifloxacin Hcl In Nacl] Nausea Only  . Sulfa Antibiotics Other (See Comments)    Blurred vision   Patient Active Problem List   Diagnosis Date Noted  . Esophageal reflux 07/03/2015  . 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 Seasonal allergies    Social History   Socioeconomic History  . Marital status: Married    Spouse name: Not on file  . Number of children: 1  . Years of education: Not on file  . Highest education level: Not on file  Occupational History  . Occupation: Freight forwarder  Social Needs  . Financial  resource strain: Not on file  . Food insecurity:    Worry: Not on file    Inability: Not on file  . Transportation needs:    Medical: Not on file    Non-medical: Not on file  Tobacco Use  . Smoking status: Never Smoker  . Smokeless tobacco: Never Used  Substance and Sexual Activity  . Alcohol use: Yes    Alcohol/week: 0.0 standard drinks    Comment: Rare  . Drug use: No  . Sexual activity: Yes    Birth control/protection: Surgical    Comment: Tubal lig-1st intercourse 57 yo-Fewer than 5 partners  Lifestyle  . Physical activity:    Days per week: Not on file    Minutes per session: Not on  file  . Stress: Not on file  Relationships  . Social connections:    Talks on phone: Not on file    Gets together: Not on file    Attends religious service: Not on file    Active member of club or organization: Not on file    Attends meetings of clubs or organizations: Not on file    Relationship status: Not on file  . Intimate partner violence:    Fear of current or ex partner: Not on file    Emotionally abused: Not on file    Physically abused: Not on file    Forced sexual activity: Not on file  Other Topics Concern  . Not on file  Social History Narrative  . Not on file    Phyllis Morgan's family history includes Cancer in her father, mother, and paternal grandmother; Heart disease in her maternal grandmother and mother; Hypertension in her father and mother.      Objective:    Vitals:   10/27/17 1504  BP: 116/80  Pulse: 80    Physical Exam; well-developed white female in no acute distress, does not blood pressure 116/80 pulse 80, height 5 foot 4, weight 163, BMI 27.9.  HEENT; nontraumatic normocephalic EOMI PERRLA sclera anicteric oral mucosa moist, Cardiovascular; regular rate and rhythm with S1-S2 no murmur rub or gallop, Pulmonary; clear bilaterally, Abdomen; soft, she has minimal tenderness in the epigastrium right upper quadrant there is no guarding or rebound no palpable mass or hepatosplenomegaly bowel sounds are present Rectal; exam not done, Extremities ;no clubbing cyanosis or edema skin warm dry, Neuro psych; alert and oriented, grossly nonfocal mood and affect appropriate       Assessment & Plan:   #62 57 year old white female with chronic GERD and celiac disease who comes in with 2 to 53-monthhistory of intermittent epigastric pain and burning despite Dexilant 60 mg p.o. daily and strict gluten-free adherence. Patient is status post cholecystectomy. Etiology of current symptoms is not clear, rule out gastropathy, peptic ulcer disease, duodenitis/enteritis  secondary to exacerbation of celiac disease  #2 hx of adenomatous colon polyps-up-to-date with colonoscopy last in January 2018, follow-up due 2023  Plan; see with differential, C met, sed rate Continue Dexilant 60 mg p.o. Daily Carafate 1 g p.o. between meals and at bedtime Patient will be scheduled for upper endoscopy with Dr. JArdis Hughswith biopsies of small bowel.  Procedure was discussed in detail with patient including indications ,risks benefits and she is agreeable to proceed. If EGD is unremarkable will need further imaging with CT.  Also requested her previous records from Dr. MCollene Mares   Addendum; review of outside records requested, EGD with Dr. MCollene MaresSeptember 2007 normal very Gastric biopsies no intestinal metaplasia no  H. 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  .Colonoscopy Dr. Collene Mares November 2013 multiple colon polyps removed, indicated for 5-year interval follow-up Path showed fragments of hyperplastic polyp and fragments of tubular adenoma.  Amy S Esterwood PA-C 10/27/2017   Cc: Nicoletta Dress, MD

## 2017-10-29 NOTE — Progress Notes (Signed)
I agree with the above note, plan 

## 2017-11-03 ENCOUNTER — Encounter: Payer: Self-pay | Admitting: Gynecology

## 2017-11-04 ENCOUNTER — Other Ambulatory Visit: Payer: Self-pay

## 2017-11-04 ENCOUNTER — Other Ambulatory Visit: Payer: Self-pay | Admitting: Gynecology

## 2017-11-04 MED ORDER — ESTRADIOL 0.1 MG/24HR TD PTTW: 1.0000 | MEDICATED_PATCH | TRANSDERMAL | 2 refills | Status: DC

## 2017-11-04 MED ORDER — ESTRADIOL 0.1 MG/24HR TD PTTW: 1.0000 | MEDICATED_PATCH | TRANSDERMAL | 7 refills | Status: DC

## 2017-12-09 ENCOUNTER — Encounter: Payer: Self-pay | Admitting: Gastroenterology

## 2017-12-15 ENCOUNTER — Ambulatory Visit (AMBULATORY_SURGERY_CENTER): Payer: BC Managed Care – PPO | Admitting: Gastroenterology

## 2017-12-15 ENCOUNTER — Encounter: Payer: Self-pay | Admitting: Gastroenterology

## 2017-12-15 VITALS — BP 97/60 | HR 67 | Temp 97.3°F | Resp 10 | Ht 64.0 in | Wt 163.0 lb

## 2017-12-15 DIAGNOSIS — R1013 Epigastric pain: Secondary | ICD-10-CM

## 2017-12-15 DIAGNOSIS — K900 Celiac disease: Secondary | ICD-10-CM

## 2017-12-15 DIAGNOSIS — K2970 Gastritis, unspecified, without bleeding: Secondary | ICD-10-CM

## 2017-12-15 DIAGNOSIS — K2950 Unspecified chronic gastritis without bleeding: Secondary | ICD-10-CM

## 2017-12-15 DIAGNOSIS — K317 Polyp of stomach and duodenum: Secondary | ICD-10-CM

## 2017-12-15 MED ORDER — SODIUM CHLORIDE 0.9 % IV SOLN: 500.0000 mL | Freq: Once | INTRAVENOUS | Status: DC

## 2017-12-15 NOTE — Progress Notes (Signed)
To PACU, VSS. Report to [email or web address] 

## 2017-12-15 NOTE — Progress Notes (Signed)
Called to room to assist during endoscopic procedure.  Patient ID and intended procedure confirmed with present staff. Received instructions for my participation in the procedure from the performing physician.  

## 2017-12-15 NOTE — Op Note (Signed)
[facility] Patient Name: Kentley Cedillo Procedure Date: 12/15/2017 9:28 AM MRN: [id number] Endoscopist: Milus Banister , MD Age: 57 Referring MD:  Date of Birth: 06/26/1960 Gender: Female Account #: [id number] Procedure:                Upper GI endoscopy Indications:              Epigastric abdominal pain Medicines:                Monitored Anesthesia Care Procedure:                Pre-Anesthesia Assessment:                           - Prior to the procedure, a History and Physical                            was performed, and patient medications and                            allergies were reviewed. The patient's tolerance of                            previous anesthesia was also reviewed. The risks                            and benefits of the procedure and the sedation                            options and risks were discussed with the patient.                            All questions were answered, and informed consent                            was obtained. Prior Anticoagulants: The patient has                            taken no previous anticoagulant or antiplatelet                            agents. ASA Grade Assessment: II - A patient with                            mild systemic disease. After reviewing the risks                            and benefits, the patient was deemed in                            satisfactory condition to undergo the procedure.                           After obtaining informed consent, the endoscope was  passed under direct vision. Throughout the                            procedure, the patient's blood pressure, pulse, and                            oxygen saturations were monitored continuously. The                            Endoscope was introduced through the mouth, and                            advanced to the second part of duodenum. The upper                            GI endoscopy was accomplished  without difficulty.                            The patient tolerated the procedure well. Scope In: Scope Out: Findings:                 Several small proximal stomach polyps, obviously                            fundic gland by appearance.                           Mild inflammation characterized by erythema and                            friability was found in the gastric antrum.                            Biopsies were taken with a cold forceps for                            histology.                           Normal duodenum, biopsied to check Celiac Sprue                            activity.                           The exam was otherwise without abnormality. Complications:            No immediate complications. Estimated blood loss:                            None. Estimated Blood Loss:     Estimated blood loss: none. Impression:               - Several small proximal stomach polyps, obviously                            fundic gland by appearance.                           -  Mild gastritis. Biopsied to check for celiac                            sprue.                           - Normal duodenum, biopsied to check Celiac Sprue                            activity.                           - The examination was otherwise normal. Recommendation:           - Patient has a contact number available for                            emergencies. The signs and symptoms of potential                            delayed complications were discussed with the                            patient. Return to normal activities tomorrow.                            Written discharge instructions were provided to the                            patient.                           - Resume previous diet.                           - Continue present medications.                           - Await pathology results. Milus Banister, MD 12/15/2017 9:55:50 AM This report has been signed electronically.

## 2017-12-15 NOTE — Patient Instructions (Signed)
Handouts provided:  Gastritis  YOU HAD AN ENDOSCOPIC PROCEDURE TODAY AT [facility]:   Refer to the procedure report that was given to you for any specific questions about what was found during the examination.  If the procedure report does not answer your questions, please call your gastroenterologist to clarify.  If you requested that your care partner not be given the details of your procedure findings, then the procedure report has been included in a sealed envelope for you to review at your convenience later.  YOU SHOULD EXPECT: Some feelings of bloating in the abdomen. Passage of more gas than usual.  Walking can help get rid of the air that was put into your GI tract during the procedure and reduce the bloating. If you had a lower endoscopy (such as a colonoscopy or flexible sigmoidoscopy) you may notice spotting of blood in your stool or on the toilet paper. If you underwent a bowel prep for your procedure, you may not have a normal bowel movement for a few days.  Please Note:  You might notice some irritation and congestion in your nose or some drainage.  This is from the oxygen used during your procedure.  There is no need for concern and it should clear up in a day or so.  SYMPTOMS TO REPORT IMMEDIATELY:   Following upper endoscopy (EGD)  Vomiting of blood or coffee ground material  New chest pain or pain under the shoulder blades  Painful or persistently difficult swallowing  New shortness of breath  Fever of 100F or higher  Black, tarry-looking stools  For urgent or emergent issues, a gastroenterologist can be reached at any hour by calling [phone].   DIET:  We do recommend a small meal at first, but then you may proceed to your regular diet.  Drink plenty of fluids but you should avoid alcoholic beverages for 24 hours.  ACTIVITY:  You should plan to take it easy for the rest of today and you should NOT DRIVE or use heavy machinery until tomorrow (because  of the sedation medicines used during the test).    FOLLOW UP: Our staff will call the number listed on your records the next business day following your procedure to check on you and address any questions or concerns that you may have regarding the information given to you following your procedure. If we do not reach you, we will leave a message.  However, if you are feeling well and you are not experiencing any problems, there is no need to return our call.  We will assume that you have returned to your regular daily activities without incident.  If any biopsies were taken you will be contacted by phone or by letter within the next 1-3 weeks.  Please call us at [phone] if you have not heard about the biopsies in 3 weeks.    SIGNATURES/CONFIDENTIALITY: You and/or your care partner have signed paperwork which will be entered into your electronic medical record.  These signatures attest to the fact that that the information above on your After Visit Summary has been reviewed and is understood.  Full responsibility of the confidentiality of this discharge information lies with you and/or your care-partner.

## 2017-12-16 ENCOUNTER — Telehealth: Payer: Self-pay | Admitting: *Deleted

## 2017-12-16 ENCOUNTER — Telehealth: Payer: Self-pay

## 2017-12-16 NOTE — Telephone Encounter (Signed)
Second post procedure follow up call, no answer 

## 2017-12-16 NOTE — Telephone Encounter (Signed)
First attempt, no answer- left VM.

## 2017-12-20 ENCOUNTER — Encounter: Payer: Self-pay | Admitting: Gynecology

## 2017-12-21 ENCOUNTER — Other Ambulatory Visit: Payer: Self-pay | Admitting: Gynecology

## 2017-12-21 MED ORDER — ESTRADIOL 0.1 MG/24HR TD PTTW: MEDICATED_PATCH | TRANSDERMAL | 2 refills | Status: DC

## 2017-12-21 MED ORDER — ESTRADIOL 0.1 MG/24HR TD PTTW: 1.0000 | MEDICATED_PATCH | TRANSDERMAL | 2 refills | Status: DC

## 2017-12-27 ENCOUNTER — Telehealth: Payer: Self-pay | Admitting: Gastroenterology

## 2017-12-27 NOTE — Telephone Encounter (Signed)
Pt wants to sched CT scan.

## 2017-12-27 NOTE — Telephone Encounter (Signed)
Left message on machine to call back  

## 2017-12-27 NOTE — Telephone Encounter (Signed)
CT scan abd/pelvis to continue the workup of her epigastric abdominal pains.

## 2017-12-28 ENCOUNTER — Ambulatory Visit: Payer: BC Managed Care – PPO | Admitting: Gastroenterology

## 2017-12-28 NOTE — Telephone Encounter (Signed)
Left message on machine to call back  

## 2017-12-29 NOTE — Telephone Encounter (Signed)
Left message on machine to call back will wait for further communication from the pt  

## 2018-01-04 NOTE — Telephone Encounter (Signed)
Pt returning phone call to Chong Sicilian states to schedule CT best call back [phone].

## 2018-01-05 ENCOUNTER — Other Ambulatory Visit: Payer: Self-pay

## 2018-01-05 DIAGNOSIS — R1013 Epigastric pain: Secondary | ICD-10-CM

## 2018-01-05 NOTE — Telephone Encounter (Signed)
appt scheduled for 01/17/18 at 2:15 pm arrive at 2 pm [place].  If you have any questions regarding your exam or if you need to reschedule, you may call the CT department at [phone] between the hours of 8:00 am and 5:00 pm, Monday-Friday.  The pt has been advised and will pick up contrast at [facility] CT prior, (elevator will be closed at 11 am on Friday)

## 2018-01-05 NOTE — Progress Notes (Unsigned)
You have been scheduled for a CT scan of the abdomen and pelvis at [place] ([address]---this is in the same building as Press photographer).   You are scheduled on *** at ***. You should arrive 15 minutes prior to your appointment time for registration. Please follow the written instructions below on the day of your exam:  WARNING: IF YOU ARE ALLERGIC TO IODINE/X-RAY DYE, PLEASE NOTIFY RADIOLOGY IMMEDIATELY AT [phone]! YOU WILL BE GIVEN A 13 HOUR PREMEDICATION PREP.  1) Do not eat or drink anything after *** (4 hours prior to your test) 2) You have been given 2 bottles of oral contrast to drink. The solution may taste better if refrigerated, but do NOT add ice or any other liquid to this solution. Shake well before drinking.    Drink 1 bottle of contrast @ *** (2 hours prior to your exam)  Drink 1 bottle of contrast @ *** (1 hour prior to your exam)  You may take any medications as prescribed with a small amount of water, if necessary. If you take any of the following medications: METFORMIN, GLUCOPHAGE, GLUCOVANCE, AVANDAMET, RIOMET, FORTAMET, [place] MET, JANUMET, GLUMETZA or METAGLIP, you MAY be asked to HOLD this medication 48 hours AFTER the exam.  The purpose of you drinking the oral contrast is to aid in the visualization of your intestinal tract. The contrast solution may cause some diarrhea. Depending on your individual set of symptoms, you may also receive an intravenous injection of x-ray contrast/dye. Plan on being at [facility] for 30 minutes or longer, depending on the type of exam you are having performed.  This test typically takes 30-45 minutes to complete.  If you have any questions regarding your exam or if you need to reschedule, you may call the CT department at [phone] between the hours of 8:00 am and 5:00 pm, Monday-Friday.  ________________________________________________________________________

## 2018-01-05 NOTE — Telephone Encounter (Signed)
Left message on machine to call back  

## 2018-01-17 ENCOUNTER — Ambulatory Visit (INDEPENDENT_AMBULATORY_CARE_PROVIDER_SITE_OTHER)
Admission: RE | Admit: 2018-01-17 | Discharge: 2018-01-17 | Disposition: A | Discharge status: Home / Self Care | Payer: BC Managed Care – PPO | Source: Ambulatory Visit | Attending: Gastroenterology | Admitting: Gastroenterology

## 2018-01-17 DIAGNOSIS — R1013 Epigastric pain: Secondary | ICD-10-CM

## 2018-01-17 MED ORDER — IOPAMIDOL (ISOVUE-300) INJECTION 61%
100.0000 mL | Freq: Once | INTRAVENOUS | Status: AC | PRN
  Administered 2018-01-17: 100 mL via INTRAVENOUS

## 2018-02-28 ENCOUNTER — Ambulatory Visit: Payer: BC Managed Care – PPO | Admitting: Gastroenterology

## 2018-03-28 ENCOUNTER — Encounter: Payer: Self-pay | Admitting: Gastroenterology

## 2018-03-28 ENCOUNTER — Ambulatory Visit: Payer: BC Managed Care – PPO | Admitting: Gastroenterology

## 2018-03-28 VITALS — BP 118/66 | HR 62 | Ht 64.0 in | Wt 172.4 lb

## 2018-03-28 DIAGNOSIS — R1013 Epigastric pain: Secondary | ICD-10-CM

## 2018-03-28 DIAGNOSIS — K900 Celiac disease: Secondary | ICD-10-CM

## 2018-03-28 NOTE — Progress Notes (Signed)
Review of pertinent gastrointestinal problems: 1.  Celiac sprue; elevated TTG in 2013 and also duodenal biopsies by Dr. Collene Mares in 2013 that suggested "intraepithelial lymphocytosis."  EGD Dr. Ardis Hughs November 2019 was essentially normal.  Biopsies of normal duodenum were also normal.  Her sprue is under very good control with good avoidance of gluten-containing products 2.  History of precancerous colon polyps.  Colonoscopy Dr. Collene Mares 12/2011 done for IDA, FH of precancerous polyp: 7 small polyps (path mixed TAs, HPs); colonoscopy January 2018 Dr. Ardis Hughs and found 2 subcentimeter polyps.  The examination was otherwise normal.  One polyp was adenomatous, the other was a sessile serrated adenoma.  I recommended she have repeat colonoscopy at 5-year interval.   HPI: This is a very pleasant 58 year old woman whom I last saw here in the office about 4 years ago.  She has since then been in the office and seen 1 of our extenders.  I did an upper endoscopy for her just a few months ago to check on her celiac sprue status, activity.  See that results summarized above.  CT scan abdomen pelvis with IV and oral contrast December 2019 was essentially normal.  She has  burning at times, fullness at time; a few days per week.  Can't find a pattern.  Can last hours.   The burning is in the right upper quadrant.  She calls a fullness at times.  It is noticeable a few times a week but it does not bother her enough to keep her from eating anything she wants to eat or working or any social commitments.  She does have rather typical pyrosis, heartburn unless she takes Dexilant once daily.  Gaining weight, 5 pounds since the holidays.  No overt bleeding.  S/p chole  Mother probably had  Celiac sprue.  Bloating if gluten gets into her diet.  Chief complaint is celiac sprue, right upper quadrant discomfort.  ROS: complete GI ROS as described in HPI, all other review negative.  Constitutional:  No unintentional  weight loss   Past Medical History:  Diagnosis Date  . Biliary dyskinesia   . Celiac disease   . Celiac disease 2013  . Colon polyps   . Gastric polyps   . GERD (gastroesophageal reflux disease)   . IDA (iron deficiency anemia)   . Osteopenia 06/2015   T score -1.2  . Seasonal allergies     Past Surgical History:  Procedure Laterality Date  . ABDOMINAL SURGERY  1993   Rt Ovarian Cystectomy  . CESAREAN SECTION    . CHOLECYSTECTOMY  2007  . ENDOMETRIAL ABLATION  12/21/06   HER OPTION CRYOABLATION   . HAND SURGERY     THUMB/RIGHT HAND  . PELVIC LAPAROSCOPY     OVARIAN CYSTECTOMY  . TUBAL LIGATION  12/2002   FALLOPE RINGS  . UMBILICAL HERNIA REPAIR      Current Outpatient Medications  Medication Sig Dispense Refill  . albuterol (PROVENTIL HFA;VENTOLIN HFA) 108 (90 Base) MCG/ACT inhaler Inhale 2 puffs into the lungs every 6 (six) hours as needed for wheezing or shortness of breath.    . Cetirizine HCl (ZYRTEC PO) Take by mouth.      . Cholecalciferol (VITAMIN D PO) Take by mouth.    . dexlansoprazole (DEXILANT) 60 MG capsule Take 1 tablet every morning. 30 capsule 11  . estradiol (VIVELLE-DOT) 0.1 MG/24HR patch Place one patch onto the skin twice weekly. Pls fill with Novan generic. 24 patch 2  . fluticasone (FLONASE) 50 MCG/ACT  nasal spray Place 2 sprays into the nose daily.    . GuaiFENesin (MUCINEX PO) Take by mouth.      . Multiple Vitamin (MULTI-VITAMIN PO) Take by mouth.    . progesterone (PROMETRIUM) 100 MG capsule TAKE 1 CAPSULE BY MOUTH QHS 90 capsule 4   Current Facility-Administered Medications  Medication Dose Route Frequency Provider Last Rate Last Dose  . 0.9 %  sodium chloride infusion  500 mL Intravenous Continuous Milus Banister, MD        Allergies as of 03/28/2018 - Review Complete 03/28/2018  Allergen Reaction Noted  . Avelox [moxifloxacin hcl in nacl] Nausea Only 08/13/2010  . Sulfa antibiotics Other (See Comments) 08/13/2010    Family  History  Problem Relation Age of Onset  . Hypertension Mother   . Heart disease Mother   . Cancer Mother        SKIN CANCER  . Hypertension Father   . Cancer Father        COLON  . Heart disease Maternal Grandmother   . Cancer Paternal Grandmother        Female cancer  ?type  . Colon cancer Neg Hx   . Esophageal cancer Neg Hx   . Rectal cancer Neg Hx   . Stomach cancer Neg Hx     Social History   Socioeconomic History  . Marital status: Married    Spouse name: Not on file  . Number of children: 1  . Years of education: Not on file  . Highest education level: Not on file  Occupational History  . Occupation: Freight forwarder  Social Needs  . Financial resource strain: Not on file  . Food insecurity:    Worry: Not on file    Inability: Not on file  . Transportation needs:    Medical: Not on file    Non-medical: Not on file  Tobacco Use  . Smoking status: Never Smoker  . Smokeless tobacco: Never Used  Substance and Sexual Activity  . Alcohol use: Yes    Alcohol/week: 0.0 standard drinks    Comment: Rare  . Drug use: No  . Sexual activity: Yes    Birth control/protection: Surgical    Comment: Tubal lig-1st intercourse 58 yo-Fewer than 5 partners  Lifestyle  . Physical activity:    Days per week: Not on file    Minutes per session: Not on file  . Stress: Not on file  Relationships  . Social connections:    Talks on phone: Not on file    Gets together: Not on file    Attends religious service: Not on file    Active member of club or organization: Not on file    Attends meetings of clubs or organizations: Not on file    Relationship status: Not on file  . Intimate partner violence:    Fear of current or ex partner: Not on file    Emotionally abused: Not on file    Physically abused: Not on file    Forced sexual activity: Not on file  Other Topics Concern  . Not on file  Social History Narrative  . Not on file     Physical Exam: BP 118/66   Pulse 62   Ht 5' 4"   (1.626 m)   Wt 172 lb 6 oz (78.2 kg)   LMP 10/10/2011   BMI 29.59 kg/m  Constitutional: generally well-appearing Psychiatric: alert and oriented x3 Abdomen: soft, nontender, nondistended, no obvious ascites, no peritoneal signs, normal bowel sounds  No peripheral edema noted in lower extremities  Assessment and plan: 58 y.o. female with celiac sprue, intermittent right upper quadrant discomforts  Her celiac sprue is under very good control since she avoids gluten in her diet very well.  Biopsies from her duodenum just a few months ago were normal.  The right upper quadrant discomfort that she describes is not significantly bothering her.  I reassured her that given her testing over the past year or so including a colonoscopy, upper endoscopy, CT scan abdomen and pelvis as well as the nature of her pain and the fact that she has been gaining weight makes it extremely unlikely that there is anything serious going on.  Hearing that she was very satisfied and knows to get in touch if the pains worsen or if she has any further questions or concerns but for now no further testing is recommended.  Possibly her mild right upper quadrant pains are adhesive related from her remote cholecystectomy.  Possibly they are mildly related to her celiac sprue.  These could also has been on ulcer dyspeptic, functional symptoms.  Please see the "Patient Instructions" section for addition details about the plan.  Owens Loffler, MD [facility] 03/28/2018, 11:20 AM

## 2018-03-28 NOTE — Patient Instructions (Addendum)
Follow up as needed.  Thank you for entrusting me with your care and choosing [facility].  Dr Ardis Hughs

## 2018-05-25 ENCOUNTER — Encounter: Payer: BC Managed Care – PPO | Admitting: Gynecology

## 2018-06-06 ENCOUNTER — Ambulatory Visit (INDEPENDENT_AMBULATORY_CARE_PROVIDER_SITE_OTHER): Payer: BC Managed Care – PPO | Admitting: Psychology

## 2018-06-06 DIAGNOSIS — F4322 Adjustment disorder with anxiety: Secondary | ICD-10-CM

## 2018-06-13 ENCOUNTER — Ambulatory Visit (INDEPENDENT_AMBULATORY_CARE_PROVIDER_SITE_OTHER): Payer: BC Managed Care – PPO | Admitting: Psychology

## 2018-06-13 DIAGNOSIS — F4322 Adjustment disorder with anxiety: Secondary | ICD-10-CM | POA: Diagnosis not present

## 2018-06-20 ENCOUNTER — Ambulatory Visit (INDEPENDENT_AMBULATORY_CARE_PROVIDER_SITE_OTHER): Payer: BC Managed Care – PPO | Admitting: Psychology

## 2018-06-20 DIAGNOSIS — F4322 Adjustment disorder with anxiety: Secondary | ICD-10-CM

## 2018-07-04 ENCOUNTER — Ambulatory Visit (INDEPENDENT_AMBULATORY_CARE_PROVIDER_SITE_OTHER): Payer: BC Managed Care – PPO | Admitting: Psychology

## 2018-07-04 DIAGNOSIS — F4322 Adjustment disorder with anxiety: Secondary | ICD-10-CM | POA: Diagnosis not present

## 2018-07-06 ENCOUNTER — Encounter: Payer: Self-pay | Admitting: Gynecology

## 2018-07-06 MED ORDER — ESTRADIOL 0.1 MG/24HR TD PTTW: MEDICATED_PATCH | TRANSDERMAL | 0 refills | Status: DC

## 2018-07-06 MED ORDER — PROGESTERONE MICRONIZED 100 MG PO CAPS: ORAL_CAPSULE | ORAL | 0 refills | Status: DC

## 2018-07-11 ENCOUNTER — Ambulatory Visit (INDEPENDENT_AMBULATORY_CARE_PROVIDER_SITE_OTHER): Payer: BC Managed Care – PPO | Admitting: Psychology

## 2018-07-11 DIAGNOSIS — F4322 Adjustment disorder with anxiety: Secondary | ICD-10-CM

## 2018-07-18 ENCOUNTER — Ambulatory Visit: Payer: BC Managed Care – PPO | Admitting: Psychology

## 2018-07-19 MED ORDER — DEXILANT 60 MG PO CPDR: DELAYED_RELEASE_CAPSULE | ORAL | 3 refills | Status: DC

## 2018-07-27 ENCOUNTER — Ambulatory Visit: Payer: BC Managed Care – PPO | Admitting: Psychology

## 2018-07-28 ENCOUNTER — Ambulatory Visit (INDEPENDENT_AMBULATORY_CARE_PROVIDER_SITE_OTHER): Payer: BC Managed Care – PPO | Admitting: Psychology

## 2018-07-28 DIAGNOSIS — F4322 Adjustment disorder with anxiety: Secondary | ICD-10-CM

## 2018-08-03 ENCOUNTER — Ambulatory Visit: Payer: BC Managed Care – PPO | Admitting: Psychology

## 2018-08-10 ENCOUNTER — Other Ambulatory Visit: Payer: Self-pay

## 2018-08-10 ENCOUNTER — Telehealth: Payer: Self-pay

## 2018-08-10 NOTE — Telephone Encounter (Signed)
Patient called because she is scheduled for her annual tomorrow and said she is feeling a lot of anxiety about coming due to the pandemic.  She had previously rescheduled in March. She is concerned if she cancels tomorrow that it might be even worse when she tries to come again.  I reassured her and told her that I think if she comes on tomorrow she will be pleased with how safe she will feel in the office. I told her only two providers here tomorrow so only a few patients will be in the office.  I explained that we are all masked as are all the patients. The waiting room is set up for social distance although we are trying to keep anyone from waiting.  I told her how we are wiping everything down many times a day.   I explained that the virus is likely to be an issue for a year or two and she still needs to take care of her health. Recommend masking, social distancing and hand washing. She felt reassured and said she will come on for her appointment tomorrow.

## 2018-08-11 ENCOUNTER — Encounter: Payer: Self-pay | Admitting: Gynecology

## 2018-08-11 ENCOUNTER — Ambulatory Visit (INDEPENDENT_AMBULATORY_CARE_PROVIDER_SITE_OTHER): Payer: BC Managed Care – PPO | Admitting: Gynecology

## 2018-08-11 VITALS — BP 120/70 | Ht 65.0 in | Wt 166.0 lb

## 2018-08-11 DIAGNOSIS — Z79890 Hormone replacement therapy (postmenopausal): Secondary | ICD-10-CM | POA: Diagnosis not present

## 2018-08-11 DIAGNOSIS — Z01419 Encounter for gynecological examination (general) (routine) without abnormal findings: Secondary | ICD-10-CM

## 2018-08-11 DIAGNOSIS — Z1151 Encounter for screening for human papillomavirus (HPV): Secondary | ICD-10-CM

## 2018-08-11 DIAGNOSIS — Z13220 Encounter for screening for lipoid disorders: Secondary | ICD-10-CM

## 2018-08-11 DIAGNOSIS — N952 Postmenopausal atrophic vaginitis: Secondary | ICD-10-CM | POA: Diagnosis not present

## 2018-08-11 DIAGNOSIS — M8580 Other specified disorders of bone density and structure, unspecified site: Secondary | ICD-10-CM | POA: Diagnosis not present

## 2018-08-11 MED ORDER — ESTRADIOL 0.1 MG/24HR TD PTTW: MEDICATED_PATCH | TRANSDERMAL | 4 refills | Status: DC

## 2018-08-11 MED ORDER — PROGESTERONE MICRONIZED 100 MG PO CAPS: ORAL_CAPSULE | ORAL | 4 refills | Status: DC

## 2018-08-11 NOTE — Patient Instructions (Addendum)
Schedule your bone density when you schedule your mammogram at [facility].  Follow-up in 1 year for annual exam, sooner if any issues.

## 2018-08-11 NOTE — Progress Notes (Signed)
    Phyllis Morgan 1960-02-19 [id number]        58 y.o.  [id number] for annual gynecologic exam.  Without gynecologic complaints  Past medical history,surgical history, problem list, medications, allergies, family history and social history were all reviewed and documented as reviewed in the EPIC chart.  ROS:  Performed with pertinent positives and negatives included in the history, assessment and plan.   Additional significant findings : None   Exam: Caryn Bee assistant Vitals:   08/11/18 0928  BP: 120/70  Weight: 166 lb (75.3 kg)  Height: 5\' 5"  (1.651 m)   Body mass index is 27.62 kg/m.  General appearance:  Normal affect, orientation and appearance. Skin: Grossly normal HEENT: Without gross lesions.  No cervical or supraclavicular adenopathy. Thyroid normal.  Lungs:  Clear without wheezing, rales or rhonchi Cardiac: RR, without RMG Abdominal:  Soft, nontender, without masses, guarding, rebound, organomegaly or hernia Breasts:  Examined lying and sitting without masses, retractions, discharge or axillary adenopathy. Pelvic:  Ext, BUS, Vagina: Normal with mild atrophic changes  Cervix: Normal with atrophic changes.  Pap smear/HPV  Uterus: Anteverted, normal size, shape and contour, midline and mobile nontender   Adnexa: Without masses or tenderness    Anus and perineum: Normal   Rectovaginal: Normal sphincter tone without palpated masses or tenderness.    Assessment/Plan:  58 y.o. [id number] female for annual gynecologic exam.   1. Postmenopausal.  Continues on Minivelle 1 mg patch and Prometrium 100 mg at bedtime.  Doing well and wants to continue.  We again reviewed risks versus benefits to include increased risk of thrombosis such as stroke heart attack DVT and breast cancer risks.  Benefits to include symptom relief as well as possible cardiovascular and bone health when started early also discussed.  She is done no bleeding.  Refill x1 year provided. 2. In the process of  arranging mammogram.  Breast exam normal today. 3. Osteopenia.  DEXA 2017 T score -1.2 stable from prior DEXA.  Recommend repeat DEXA now when she schedules her mammogram at [facility]. 4. Pap smear/HPV 2015.  Pap smear/HPV today.  No history of abnormal Pap smears. 5. Colonoscopy 2018.  Repeat at their recommended interval. 6. Health maintenance.  Baseline CBC, CMP, lipid profile, vitamin D and TSH ordered.  Follow-up 1 year, sooner as needed.   Anastasio Auerbach MD, 9:53 AM 08/11/2018

## 2018-08-11 NOTE — Addendum Note (Signed)
Addended by: Nelva Nay on: 08/11/2018 10:17 AM   Modules accepted: Orders

## 2018-08-12 ENCOUNTER — Encounter: Payer: Self-pay | Admitting: Gynecology

## 2018-08-12 LAB — CBC WITH DIFFERENTIAL/PLATELET
Absolute Monocytes: 386 cells/uL (ref 200–950)
Basophils Absolute: 51 cells/uL (ref 0–200)
Basophils Relative: 1.1 %
Eosinophils Absolute: 129 cells/uL (ref 15–500)
Eosinophils Relative: 2.8 %
HCT: 37.1 % (ref 35.0–45.0)
Hemoglobin: 12.3 g/dL (ref 11.7–15.5)
Lymphs Abs: 1072 cells/uL (ref 850–3900)
MCH: 29.3 pg (ref 27.0–33.0)
MCHC: 33.2 g/dL (ref 32.0–36.0)
MCV: 88.3 fL (ref 80.0–100.0)
MPV: 12.2 fL (ref 7.5–12.5)
Monocytes Relative: 8.4 %
Neutro Abs: 2962 cells/uL (ref 1500–7800)
Neutrophils Relative %: 64.4 %
Platelets: 221 10*3/uL (ref 140–400)
RBC: 4.2 10*6/uL (ref 3.80–5.10)
RDW: 12.9 % (ref 11.0–15.0)
Total Lymphocyte: 23.3 %
WBC: 4.6 10*3/uL (ref 3.8–10.8)

## 2018-08-12 LAB — COMPREHENSIVE METABOLIC PANEL
AG Ratio: 1.6 (calc) (ref 1.0–2.5)
ALT: 10 U/L (ref 6–29)
AST: 18 U/L (ref 10–35)
Albumin: 3.9 g/dL (ref 3.6–5.1)
Alkaline phosphatase (APISO): 60 U/L (ref 37–153)
BUN: 19 mg/dL (ref 7–25)
CO2: 25 mmol/L (ref 20–32)
Calcium: 9.1 mg/dL (ref 8.6–10.4)
Chloride: 105 mmol/L (ref 98–110)
Creat: 1.02 mg/dL (ref 0.50–1.05)
Globulin: 2.5 g/dL (calc) (ref 1.9–3.7)
Glucose, Bld: 71 mg/dL (ref 65–99)
Potassium: 4.4 mmol/L (ref 3.5–5.3)
Sodium: 138 mmol/L (ref 135–146)
Total Bilirubin: 0.4 mg/dL (ref 0.2–1.2)
Total Protein: 6.4 g/dL (ref 6.1–8.1)

## 2018-08-12 LAB — VITAMIN D 25 HYDROXY (VIT D DEFICIENCY, FRACTURES): Vit D, 25-Hydroxy: 37 ng/mL (ref 30–100)

## 2018-08-12 LAB — PAP IG AND HPV HIGH-RISK: HPV DNA High Risk: NOT DETECTED

## 2018-08-12 LAB — LIPID PANEL
Cholesterol: 165 mg/dL (ref <200)
HDL: 33 mg/dL — ABNORMAL LOW (ref >=50)
LDL Cholesterol (Calc): 103 mg/dL (calc) — ABNORMAL HIGH
Non-HDL Cholesterol (Calc): 132 mg/dL (calc) — ABNORMAL HIGH (ref <130)
Total CHOL/HDL Ratio: 5 (calc) — ABNORMAL HIGH (ref <5.0)
Triglycerides: 170 mg/dL — ABNORMAL HIGH (ref <150)

## 2018-08-12 LAB — TSH: TSH: 2.15 mIU/L (ref 0.40–4.50)

## 2018-08-17 NOTE — Telephone Encounter (Signed)
Yes they will be on MyChart.  There is a lag from when I see them to when they are posted.

## 2018-08-24 ENCOUNTER — Ambulatory Visit (INDEPENDENT_AMBULATORY_CARE_PROVIDER_SITE_OTHER): Payer: BC Managed Care – PPO | Admitting: Psychology

## 2018-08-24 DIAGNOSIS — F4322 Adjustment disorder with anxiety: Secondary | ICD-10-CM | POA: Diagnosis not present

## 2018-10-03 ENCOUNTER — Ambulatory Visit (INDEPENDENT_AMBULATORY_CARE_PROVIDER_SITE_OTHER): Payer: BC Managed Care – PPO | Admitting: Psychology

## 2018-10-03 DIAGNOSIS — F4322 Adjustment disorder with anxiety: Secondary | ICD-10-CM | POA: Diagnosis not present

## 2018-11-01 ENCOUNTER — Encounter: Payer: Self-pay | Admitting: Gynecology

## 2018-11-04 ENCOUNTER — Encounter: Payer: Self-pay | Admitting: Gynecology

## 2018-11-10 ENCOUNTER — Encounter: Payer: Self-pay | Admitting: Gynecology

## 2018-11-11 ENCOUNTER — Encounter: Payer: Self-pay | Admitting: Gynecology

## 2018-11-14 ENCOUNTER — Encounter: Payer: Self-pay | Admitting: Gynecology

## 2018-12-06 ENCOUNTER — Telehealth: Payer: Self-pay

## 2018-12-06 NOTE — Telephone Encounter (Signed)
Patient was prescribed Minivelle 0.1 mg patch.  We received fax note from pharmacy about it not being covered by her insurance but when I called the pharmacy they said it went through for $16. Phyllis Morgan to ignore the fax we received.

## 2018-12-18 HISTORY — PX: OTHER SURGICAL HISTORY: SHX169

## 2019-06-07 ENCOUNTER — Other Ambulatory Visit: Payer: Self-pay

## 2019-06-07 MED ORDER — DEXILANT 60 MG PO CPDR: DELAYED_RELEASE_CAPSULE | ORAL | 6 refills | Status: DC

## 2019-08-15 ENCOUNTER — Encounter: Payer: Self-pay | Admitting: Nurse Practitioner

## 2019-08-15 ENCOUNTER — Ambulatory Visit (INDEPENDENT_AMBULATORY_CARE_PROVIDER_SITE_OTHER): Payer: BC Managed Care – PPO | Admitting: Nurse Practitioner

## 2019-08-15 ENCOUNTER — Other Ambulatory Visit: Payer: Self-pay

## 2019-08-15 VITALS — BP 118/78 | Ht 65.0 in | Wt 170.0 lb

## 2019-08-15 DIAGNOSIS — E559 Vitamin D deficiency, unspecified: Secondary | ICD-10-CM

## 2019-08-15 DIAGNOSIS — Z13220 Encounter for screening for lipoid disorders: Secondary | ICD-10-CM | POA: Diagnosis not present

## 2019-08-15 DIAGNOSIS — Z01419 Encounter for gynecological examination (general) (routine) without abnormal findings: Secondary | ICD-10-CM | POA: Diagnosis not present

## 2019-08-15 DIAGNOSIS — M8589 Other specified disorders of bone density and structure, multiple sites: Secondary | ICD-10-CM

## 2019-08-15 DIAGNOSIS — Z79890 Hormone replacement therapy (postmenopausal): Secondary | ICD-10-CM | POA: Diagnosis not present

## 2019-08-15 MED ORDER — ESTRADIOL 0.1 MG/24HR TD PTTW: MEDICATED_PATCH | TRANSDERMAL | 4 refills | Status: DC

## 2019-08-15 MED ORDER — PROGESTERONE MICRONIZED 100 MG PO CAPS: 100.0000 mg | ORAL_CAPSULE | Freq: Every day | ORAL | 3 refills | Status: DC

## 2019-08-15 NOTE — Progress Notes (Signed)
   Phyllis Morgan 28-May-1960 [id number]   History:  59 y.o. G3P1021 presents for annual exam without GYN complaints.  Postmenopausal, on Vivelle 0.1 mg patch and Prometrium 100 mg daily with good relief of hot flashes, no bleeding.  Gynecologic History Patient's last menstrual period was 10/10/2011.   Contraception: post menopausal status Last Pap: 08/11/2018. Results were: normal Last mammogram: 11/10/2018. Results were: normal Last colonoscopy: 02/14/2016. Results were: polyps Last Dexa: 11/10/2018. Results were: T score - 1.3, FRAX 6.5% / 0.4%  Past medical history, past surgical history, family history and social history were all reviewed and documented in the EPIC chart.  ROS:  A ROS was performed and pertinent positives and negatives are included.  Exam:  Vitals:   08/15/19 0920  BP: 118/78  Weight: 170 lb (77.1 kg)  Height: 5' 5"  (1.651 m)   Body mass index is 28.29 kg/m.  General appearance:  Normal Thyroid:  Symmetrical, normal in size, without palpable masses or nodularity. Respiratory  Auscultation:  Clear without wheezing or rhonchi Cardiovascular  Auscultation:  Regular rate, without rubs, murmurs or gallops  Edema/varicosities:  Not grossly evident Abdominal  Soft,nontender, without masses, guarding or rebound.  Liver/spleen:  No organomegaly noted  Hernia:  None appreciated  Skin  Inspection:  Grossly normal   Breasts: Examined lying and sitting.   Right: Without masses, retractions, discharge or axillary adenopathy.   Left: Without masses, retractions, discharge or axillary adenopathy. Gentitourinary   Inguinal/mons:  Normal without inguinal adenopathy  External genitalia:  Normal  BUS/Urethra/Skene's glands:  Normal  Vagina:  Normal, atrophic changes  Cervix:  Normal  Uterus:  Anteverted, normal in size, shape and contour.  Midline and mobile  Adnexa/parametria:     Rt: Without masses or tenderness.   Lt: Without masses or tenderness.  Anus and  perineum: Normal  Digital rectal exam: Normal sphincter tone without palpated masses or tenderness  Assessment/Plan:  59 y.o. [id number] for annual exam.  Well female exam with routine gynecological exam - Plan: CBC with Differential/Platelet, Comprehensive metabolic panel. Education provided on SBEs, importance of preventative screenings, current guidelines, high calcium diet, regular exercise, and multivitamin daily.   Hormone replacement therapy (HRT) - Plan: estradiol (VIVELLE-DOT) 0.1 MG/24HR patch, progesterone (PROMETRIUM) 100 MG capsule. We reviewed the risks versus benefits to include risk for blood clots, heart attack, stroke, and breast cancer. Benefits to include symptom management and possible cardiovascular and bone health. Refill x1 year provided.   Lipid screening - Plan: Lipid panel. Elevated triglycerides last year. She has been exercising and working on weight loss.   Vitamin D deficiency - Plan: VITAMIN D 25 Hydroxy (Vit-D Deficiency, Fractures). Taking Vitamin D supplement inconsistently  Osteopenia of multiple sites - Recommend Vitamin D supplement daily and regular exercise that includes weightbearing exercises.   Follow up in 1 year for annual       [place], 9:42 AM 08/15/2019

## 2019-08-15 NOTE — Patient Instructions (Signed)
Menopause and Hormone Replacement Therapy Menopause is a normal time of life when menstrual periods stop completely and the ovaries stop producing the female hormones estrogen and progesterone. This lack of hormones can affect your health and cause undesirable symptoms. Hormone replacement therapy (HRT) can relieve some of those symptoms. What is hormone replacement therapy? HRT is the use of artificial (synthetic) hormones to replace hormones that your body has stopped producing because you have reached menopause. What are my options for HRT?  HRT may consist of the synthetic hormones estrogen and progestin, or it may consist of only estrogen (estrogen-only therapy). You and your health care provider will decide which form of HRT is best for you. If you choose to be on HRT and you have a uterus, estrogen and progestin are usually prescribed. Estrogen-only therapy is used for women who do not have a uterus. Possible options for taking HRT include:  Pills.  Patches.  Gels.  Sprays.  Vaginal cream.  Vaginal rings.  Vaginal inserts. The amount of hormone(s) that you take and how long you take the hormone(s) varies according to your health. It is important to:  Begin HRT with the lowest possible dosage.  Stop HRT as soon as your health care provider tells you to stop.  Work with your health care provider so that you feel informed and comfortable with your decisions. What are the benefits of HRT? HRT can reduce the frequency and severity of menopausal symptoms. Benefits of HRT vary according to the kind of symptoms that you have, how severe they are, and your overall health. HRT may help to improve the following symptoms of menopause:  Hot flashes and night sweats. These are sudden feelings of heat that spread over the face and body. The skin may turn red, like a blush. Night sweats are hot flashes that happen while you are sleeping or trying to sleep.  Bone loss (osteoporosis). The  body loses calcium more quickly after menopause, causing the bones to become weaker. This can increase the risk for bone breaks (fractures).  Vaginal dryness. The lining of the vagina can become thin and dry, which can cause pain during sex or cause infection, burning, or itching.  Urinary tract infections.  Urinary incontinence. This is the inability to control when you pass urine.  Irritability.  Short-term memory problems. What are the risks of HRT? Risks of HRT vary depending on your individual health and medical history. Risks of HRT also depend on whether you receive both estrogen and progestin or you receive estrogen only. HRT may increase the risk of:  Spotting. This is when a small amount of blood leaks from the vagina unexpectedly.  Endometrial cancer. This cancer is in the lining of the uterus (endometrium).  Breast cancer.  Increased density of breast tissue. This can make it harder to find breast cancer on a breast X-ray (mammogram).  Stroke.  Heart disease.  Blood clots.  Gallbladder disease.  Liver disease. Risks of HRT can increase if you have any of the following conditions:  Endometrial cancer.  Liver disease.  Heart disease.  Breast cancer.  History of blood clots.  History of stroke. Follow these instructions at home:  Take over-the-counter and prescription medicines only as told by your health care provider.  Get mammograms, pelvic exams, and medical checkups as often as told by your health care provider.  Have Pap tests done as often as told by your health care provider. A Pap test is sometimes called a Pap smear. It  is a screening test that is used to check for signs of cancer of the cervix and vagina. A Pap test can also identify the presence of infection or precancerous changes. Pap tests may be done: ? Every 3 years, starting at age 47. ? Every 5 years, starting after age 29, in combination with testing for human papillomavirus  (HPV). ? More often or less often depending on other medical conditions you have, your age, and other risk factors.  It is up to you to get the results of your Pap test. Ask your health care provider, or the department that is doing the test, when your results will be ready.  Keep all follow-up visits as told by your health care provider. This is important. Contact a health care provider if you have:  Pain or swelling in your legs.  Shortness of breath.  Chest pain.  Lumps or changes in your breasts or armpits.  Slurred speech.  Pain, burning, or bleeding when you urinate.  Unusual vaginal bleeding.  Dizziness or headaches.  Weakness or numbness in any part of your arms or legs.  Pain in your abdomen. Summary  Menopause is a normal time of life when menstrual periods stop completely and the ovaries stop producing the female hormones estrogen and progesterone.  Hormone replacement therapy (HRT) can relieve some of the symptoms of menopause.  HRT can reduce the frequency and severity of menopausal symptoms.  Risks of HRT vary depending on your individual health and medical history. This information is not intended to replace advice given to you by your health care provider. Make sure you discuss any questions you have with your health care provider. Document Revised: 09/21/2017 Document Reviewed: 09/21/2017 Elsevier Patient Education  2020 [place] Maintenance, Female Adopting a healthy lifestyle and getting preventive care are important in promoting health and wellness. Ask your health care provider about:  The right schedule for you to have regular tests and exams.  Things you can do on your own to prevent diseases and keep yourself healthy. What should I know about diet, weight, and exercise? Eat a healthy diet   Eat a diet that includes plenty of vegetables, fruits, low-fat dairy products, and lean protein.  Do not eat a lot of foods that are high in  solid fats, added sugars, or sodium. Maintain a healthy weight Body mass index (BMI) is used to identify weight problems. It estimates body fat based on height and weight. Your health care provider can help determine your BMI and help you achieve or maintain a healthy weight. Get regular exercise Get regular exercise. This is one of the most important things you can do for your health. Most adults should:  Exercise for at least 150 minutes each week. The exercise should increase your heart rate and make you sweat (moderate-intensity exercise).  Do strengthening exercises at least twice a week. This is in addition to the moderate-intensity exercise.  Spend less time sitting. Even light physical activity can be beneficial. Watch cholesterol and blood lipids Have your blood tested for lipids and cholesterol at 57 years of age, then have this test every 5 years. Have your cholesterol levels checked more often if:  Your lipid or cholesterol levels are high.  You are older than 57 years of age.  You are at high risk for heart disease. What should I know about cancer screening? Depending on your health history and family history, you may need to have cancer screening at various ages. This may  include screening for:  Breast cancer.  Cervical cancer.  Colorectal cancer.  Skin cancer.  Lung cancer. What should I know about heart disease, diabetes, and high blood pressure? Blood pressure and heart disease  High blood pressure causes heart disease and increases the risk of stroke. This is more likely to develop in people who have high blood pressure readings, are of African descent, or are overweight.  Have your blood pressure checked: ? Every 3-5 years if you are 22-46 years of age. ? Every year if you are 58 years old or older. Diabetes Have regular diabetes screenings. This checks your fasting blood sugar level. Have the screening done:  Once every three years after age 66 if you  are at a normal weight and have a low risk for diabetes.  More often and at a younger age if you are overweight or have a high risk for diabetes. What should I know about preventing infection? Hepatitis B If you have a higher risk for hepatitis B, you should be screened for this virus. Talk with your health care provider to find out if you are at risk for hepatitis B infection. Hepatitis C Testing is recommended for:  Everyone born from [birth year] through 1963.  Anyone with known risk factors for hepatitis C. Sexually transmitted infections (STIs)  Get screened for STIs, including gonorrhea and chlamydia, if: ? You are sexually active and are younger than 57 years of age. ? You are older than 57 years of age and your health care provider tells you that you are at risk for this type of infection. ? Your sexual activity has changed since you were last screened, and you are at increased risk for chlamydia or gonorrhea. Ask your health care provider if you are at risk.  Ask your health care provider about whether you are at high risk for HIV. Your health care provider may recommend a prescription medicine to help prevent HIV infection. If you choose to take medicine to prevent HIV, you should first get tested for HIV. You should then be tested every 3 months for as long as you are taking the medicine. Pregnancy  If you are about to stop having your period (premenopausal) and you may become pregnant, seek counseling before you get pregnant.  Take 400 to 800 micrograms (mcg) of folic acid every day if you become pregnant.  Ask for birth control (contraception) if you want to prevent pregnancy. Osteoporosis and menopause Osteoporosis is a disease in which the bones lose minerals and strength with aging. This can result in bone fractures. If you are 52 years old or older, or if you are at risk for osteoporosis and fractures, ask your health care provider if you should:  Be screened for bone  loss.  Take a calcium or vitamin D supplement to lower your risk of fractures.  Be given hormone replacement therapy (HRT) to treat symptoms of menopause. Follow these instructions at home: Lifestyle  Do not use any products that contain nicotine or tobacco, such as cigarettes, e-cigarettes, and chewing tobacco. If you need help quitting, ask your health care provider.  Do not use street drugs.  Do not share needles.  Ask your health care provider for help if you need support or information about quitting drugs. Alcohol use  Do not drink alcohol if: ? Your health care provider tells you not to drink. ? You are pregnant, may be pregnant, or are planning to become pregnant.  If you drink alcohol: ? Limit how  much you use to 0-1 drink a day. ? Limit intake if you are breastfeeding.  Be aware of how much alcohol is in your drink. In the [place] one drink equals one 12 oz bottle of beer (355 mL), one 5 oz glass of wine (148 mL), or one 1 oz glass of hard liquor (44 mL). General instructions  Schedule regular health, dental, and eye exams.  Stay current with your vaccines.  Tell your health care provider if: ? You often feel depressed. ? You have ever been abused or do not feel safe at home. Summary  Adopting a healthy lifestyle and getting preventive care are important in promoting health and wellness.  Follow your health care provider's instructions about healthy diet, exercising, and getting tested or screened for diseases.  Follow your health care provider's instructions on monitoring your cholesterol and blood pressure. This information is not intended to replace advice given to you by your health care provider. Make sure you discuss any questions you have with your health care provider. Document Revised: 01/12/2018 Document Reviewed: 01/12/2018 Elsevier Patient Education  2020 [employer].

## 2019-08-16 LAB — COMPREHENSIVE METABOLIC PANEL
AG Ratio: 1.6 (calc) (ref 1.0–2.5)
ALT: 11 U/L (ref 6–29)
AST: 18 U/L (ref 10–35)
Albumin: 4.1 g/dL (ref 3.6–5.1)
Alkaline phosphatase (APISO): 61 U/L (ref 37–153)
BUN: 20 mg/dL (ref 7–25)
CO2: 27 mmol/L (ref 20–32)
Calcium: 8.8 mg/dL (ref 8.6–10.4)
Chloride: 103 mmol/L (ref 98–110)
Creat: 0.94 mg/dL (ref 0.50–1.05)
Globulin: 2.5 g/dL (calc) (ref 1.9–3.7)
Glucose, Bld: 87 mg/dL (ref 65–99)
Potassium: 3.9 mmol/L (ref 3.5–5.3)
Sodium: 138 mmol/L (ref 135–146)
Total Bilirubin: 0.4 mg/dL (ref 0.2–1.2)
Total Protein: 6.6 g/dL (ref 6.1–8.1)

## 2019-08-16 LAB — CBC WITH DIFFERENTIAL/PLATELET
Absolute Monocytes: 398 cells/uL (ref 200–950)
Basophils Absolute: 41 cells/uL (ref 0–200)
Basophils Relative: 1 %
Eosinophils Absolute: 160 cells/uL (ref 15–500)
Eosinophils Relative: 3.9 %
HCT: 35.9 % (ref 35.0–45.0)
Hemoglobin: 11.6 g/dL — ABNORMAL LOW (ref 11.7–15.5)
Lymphs Abs: 1074 cells/uL (ref 850–3900)
MCH: 28.7 pg (ref 27.0–33.0)
MCHC: 32.3 g/dL (ref 32.0–36.0)
MCV: 88.9 fL (ref 80.0–100.0)
MPV: 12.1 fL (ref 7.5–12.5)
Monocytes Relative: 9.7 %
Neutro Abs: 2427 cells/uL (ref 1500–7800)
Neutrophils Relative %: 59.2 %
Platelets: 200 10*3/uL (ref 140–400)
RBC: 4.04 10*6/uL (ref 3.80–5.10)
RDW: 12.4 % (ref 11.0–15.0)
Total Lymphocyte: 26.2 %
WBC: 4.1 10*3/uL (ref 3.8–10.8)

## 2019-08-16 LAB — LIPID PANEL
Cholesterol: 172 mg/dL (ref <200)
HDL: 34 mg/dL — ABNORMAL LOW (ref >=50)
LDL Cholesterol (Calc): 115 mg/dL (calc) — ABNORMAL HIGH
Non-HDL Cholesterol (Calc): 138 mg/dL (calc) — ABNORMAL HIGH (ref <130)
Total CHOL/HDL Ratio: 5.1 (calc) — ABNORMAL HIGH (ref <5.0)
Triglycerides: 120 mg/dL (ref <150)

## 2019-08-16 LAB — VITAMIN D 25 HYDROXY (VIT D DEFICIENCY, FRACTURES): Vit D, 25-Hydroxy: 40 ng/mL (ref 30–100)

## 2019-10-18 ENCOUNTER — Ambulatory Visit: Payer: BC Managed Care – PPO | Admitting: Family

## 2020-01-03 ENCOUNTER — Encounter: Payer: Self-pay | Admitting: Family

## 2020-01-03 ENCOUNTER — Other Ambulatory Visit: Payer: Self-pay

## 2020-01-03 ENCOUNTER — Ambulatory Visit (INDEPENDENT_AMBULATORY_CARE_PROVIDER_SITE_OTHER): Payer: BC Managed Care – PPO

## 2020-01-03 ENCOUNTER — Ambulatory Visit: Payer: BC Managed Care – PPO | Admitting: Family

## 2020-01-03 VITALS — BP 116/78 | HR 85 | Temp 98.0°F | Ht 65.0 in | Wt 173.0 lb

## 2020-01-03 DIAGNOSIS — R0789 Other chest pain: Secondary | ICD-10-CM | POA: Diagnosis not present

## 2020-01-03 DIAGNOSIS — J309 Allergic rhinitis, unspecified: Secondary | ICD-10-CM

## 2020-01-03 DIAGNOSIS — J42 Unspecified chronic bronchitis: Secondary | ICD-10-CM | POA: Insufficient documentation

## 2020-01-03 NOTE — Progress Notes (Signed)
Phyllis Morgan is a 59 y.o. female with the following history as recorded in [employer]:  Patient Active Problem List   Diagnosis Date Noted  . Chronic bronchitis, unspecified chronic bronchitis type ([place]) 01/03/2020  . Esophageal reflux 07/03/2015  . 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 Seasonal allergies   . Celiac disease 02/03/2011    Current Outpatient Medications  Medication Sig Dispense Refill  . albuterol (PROVENTIL HFA;VENTOLIN HFA) 108 (90 Base) MCG/ACT inhaler Inhale 2 puffs into the lungs every 6 (six) hours as needed for wheezing or shortness of breath.    . Cholecalciferol (VITAMIN D PO) Take by mouth.    . dexlansoprazole (DEXILANT) 60 MG capsule Take 1 tablet every morning. 30 capsule 6  . diphenhydrAMINE (BENADRYL) 25 MG tablet Take 25 mg by mouth every 6 (six) hours as needed.    Marland Kitchen estradiol (VIVELLE-DOT) 0.1 MG/24HR patch Place one patch onto the skin twice weekly. Pls fill with Novan generic. 24 patch 4  . fluticasone (FLONASE) 50 MCG/ACT nasal spray Place 2 sprays into the nose daily.    . GuaiFENesin (MUCINEX PO) Take by mouth.      . loratadine (CLARITIN) 10 MG tablet Take 10 mg by mouth daily.    . Multiple Vitamin (MULTI-VITAMIN PO) Take by mouth.    . progesterone (PROMETRIUM) 100 MG capsule Take 1 capsule (100 mg total) by mouth at bedtime. 90 capsule 3  . pseudoephedrine (SUDAFED) 60 MG tablet Take 60 mg by mouth every 4 (four) hours as needed.     No current facility-administered medications for this visit.    Allergies: Avelox [moxifloxacin hcl in nacl] and Sulfa antibiotics  Past Medical History:  Diagnosis Date  . Biliary dyskinesia   . Celiac disease   . Celiac disease 2013  . Colon polyps   . Gastric polyps   . GERD (gastroesophageal reflux disease)   . IDA (iron deficiency anemia)   . Osteopenia 11/2018   T score -1.3 FRAX 6.5% / 0.4% stable from prior DEXA  . Seasonal allergies     Past Surgical History:  Procedure Laterality Date  . ABDOMINAL  SURGERY  1993   Rt Ovarian Cystectomy  . CESAREAN SECTION    . CHOLECYSTECTOMY  2007  . ENDOMETRIAL ABLATION  12/21/06   HER OPTION CRYOABLATION   . HAND SURGERY     THUMB/RIGHT HAND  . KNEE SURGERY    . Left eye retinal tear  12/18/2018   [employer]  . PELVIC LAPAROSCOPY     OVARIAN CYSTECTOMY  . TUBAL LIGATION  12/2002   FALLOPE RINGS  . UMBILICAL HERNIA REPAIR      Family History  Problem Relation Age of Onset  . Hypertension Mother   . Heart disease Mother   . Cancer Mother        SKIN CANCER  . Hypertension Father   . Cancer Father        COLON  . Heart disease Maternal Grandmother   . Cancer Paternal Grandmother        Female cancer  ?type  . Colon cancer Neg Hx   . Esophageal cancer Neg Hx   . Rectal cancer Neg Hx   . Stomach cancer Neg Hx     Social History   Tobacco Use  . Smoking status: Never Smoker  . Smokeless tobacco: Never Used  Substance Use Topics  . Alcohol use: Yes    Alcohol/week: 0.0 standard drinks    Comment: Rare  Subjective:  Presents today as a new patient today; concerned about chronic sinus issues; would like to see an allergist to see if she is candidate for allergy injections;  Has been having some episodes of "mild chest pain" on and off for the past 2 years; did a stress test which was normal; family history of heart disease- would feel better just to make sure everything is okay.  Sees GI and GYN regularly; dermatology yearly for skin check- Dr. Delman Cheadle;   Up to date on flu shot, Tdap and COVID vaccines; wants to plan to get Shingrix vaccine at later dates;    Objective:  Vitals:   01/03/20 1001  BP: 116/78  Pulse: 85  Temp: 98 F (36.7 C)  TempSrc: Oral  SpO2: 99%  Weight: 173 lb (78.5 kg)  Height: 5' 5"  (1.651 m)    General: Well developed, well nourished, in no acute distress  Skin : Warm and dry.  Head: Normocephalic and atraumatic  Eyes: Sclera and conjunctiva clear; pupils round and reactive to  light; extraocular movements intact  Ears: External normal; canals clear; tympanic membranes normal  Oropharynx: Pink, supple. No suspicious lesions  Neck: Supple without thyromegaly, adenopathy  Lungs: Respirations unlabored; clear to auscultation bilaterally without wheeze, rales, rhonchi  CVS exam: normal rate and regular rhythm.  Neurologic: Alert and oriented; speech intact; face symmetrical; moves all extremities well; CNII-XII intact without focal deficit    Assessment:  1. Chronic allergic rhinitis   2. Atypical chest pain     Plan:  1. Refer to allergist per patient request; 2. Has had normal stress test in the past year but due to [place] agree that cardiology follow-up if appropriate; referral updated; CXR done today; ? Underlying asthma and she will discuss with asthma/ allergy specialist;  This visit occurred during the SARS-CoV-2 public health emergency.  Safety protocols were in place, including screening questions prior to the visit, additional usage of staff PPE, and extensive cleaning of exam room while observing appropriate contact time as indicated for disinfecting solutions.     No follow-ups on file.  Orders Placed This Encounter  Procedures  . DG Chest 2 View    Standing Status:   Future    Number of Occurrences:   1    Standing Expiration Date:   01/02/2021    Order Specific Question:   Reason for Exam (SYMPTOM  OR DIAGNOSIS REQUIRED)    Answer:   atypical chest pain    Order Specific Question:   Is patient pregnant?    Answer:   No    Order Specific Question:   Preferred imaging location?    Answer:   [facility]  . Ambulatory referral to Allergy    Referral Priority:   Routine    Referral Type:   Allergy Testing    Referral Reason:   Specialty Services Required    Requested Specialty:   Allergy    Number of Visits Requested:   1  . Ambulatory referral to Cardiology    Referral Priority:   Routine    Referral Type:   Consultation    Referral  Reason:   Specialty Services Required    Requested Specialty:   Cardiology    Number of Visits Requested:   1    Requested Prescriptions    No prescriptions requested or ordered in this encounter

## 2020-01-11 ENCOUNTER — Encounter: Payer: Self-pay | Admitting: Family

## 2020-01-11 DIAGNOSIS — Z86010 Personal history of colonic polyps: Secondary | ICD-10-CM

## 2020-01-11 MED ORDER — ALBUTEROL SULFATE HFA 108 (90 BASE) MCG/ACT IN AERS: 2.0000 | INHALATION_SPRAY | Freq: Four times a day (QID) | RESPIRATORY_TRACT | 0 refills | Status: DC | PRN

## 2020-01-12 ENCOUNTER — Encounter: Payer: Self-pay | Admitting: Internal Medicine

## 2020-01-12 ENCOUNTER — Ambulatory Visit: Payer: BC Managed Care – PPO | Admitting: Internal Medicine

## 2020-01-12 ENCOUNTER — Other Ambulatory Visit: Payer: Self-pay

## 2020-01-12 VITALS — BP 92/68 | HR 97 | Ht 64.0 in | Wt 171.2 lb

## 2020-01-12 DIAGNOSIS — Z01810 Encounter for preprocedural cardiovascular examination: Secondary | ICD-10-CM | POA: Diagnosis not present

## 2020-01-12 DIAGNOSIS — I493 Ventricular premature depolarization: Secondary | ICD-10-CM | POA: Diagnosis not present

## 2020-01-12 DIAGNOSIS — Z8249 Family history of ischemic heart disease and other diseases of the circulatory system: Secondary | ICD-10-CM | POA: Diagnosis not present

## 2020-01-12 DIAGNOSIS — R079 Chest pain, unspecified: Secondary | ICD-10-CM | POA: Diagnosis not present

## 2020-01-12 MED ORDER — METOPROLOL TARTRATE 50 MG PO TABS: ORAL_TABLET | ORAL | 0 refills | Status: DC

## 2020-01-12 NOTE — Progress Notes (Signed)
Cardiology Office Note:    Date:  01/12/2020   ID:  CONGETTA ODRISCOLL, DOB 16-Apr-1960, MRN [id number]  PCP:  Marrian Salvage, [place] Cardiologist:  No primary care provider on file.  [facility] Electrophysiologist:  None   CC: Chest discomfort Consulted for the evaluation of chest pain at the behest of Reddick, Marvis Repress, FNP  History of Present Illness:    Phyllis Morgan is a 59 y.o. female with a hx of Celiac Disease, HLD, GERD, and IDA who presents for evaluation.  Patient notes that she has feeling fine at rest.  With her present blood pressure has dizziness or weakness.  Has had sharp sternal chest pain, sometimes this is just a twinge No associated chest pressure or shortness of breath. Pain radiates into her left arm  Patient exertion notable for walking 30 minutes a day and does yoga feels no symptoms.  No shortness of breath at rest.  No weight gain, leg swelling , or abdominal swelling.  No syncope or near syncope.  Patient reports prior cardiac testing including equivocal exercise stress test (1/1.5 mg upsloping ST depression in the inferior leads) [facility] 04/07/2018.  Past Medical History:  Diagnosis Date  . Biliary dyskinesia   . Celiac disease   . Celiac disease 2013  . Colon polyps   . Gastric polyps   . GERD (gastroesophageal reflux disease)   . IDA (iron deficiency anemia)   . Osteopenia 11/2018   T score -1.3 FRAX 6.5% / 0.4% stable from prior DEXA  . Seasonal allergies     Past Surgical History:  Procedure Laterality Date  . ABDOMINAL SURGERY  1993   Rt Ovarian Cystectomy  . CESAREAN SECTION    . CHOLECYSTECTOMY  2007  . ENDOMETRIAL ABLATION  12/21/06   HER OPTION CRYOABLATION   . HAND SURGERY     THUMB/RIGHT HAND  . KNEE SURGERY    . Left eye retinal tear  12/18/2018   [employer]  . PELVIC LAPAROSCOPY     OVARIAN CYSTECTOMY  . TUBAL LIGATION  12/2002   FALLOPE RINGS  . UMBILICAL HERNIA REPAIR       Current Medications: Current Meds  Medication Sig  . albuterol (VENTOLIN HFA) 108 (90 Base) MCG/ACT inhaler Inhale 2 puffs into the lungs every 6 (six) hours as needed for wheezing or shortness of breath.  . Cholecalciferol (VITAMIN D PO) Take by mouth.  . Cholecalciferol 50 MCG (2000 UT) TABS Take 1 tablet by mouth daily.  Marland Kitchen dexlansoprazole (DEXILANT) 60 MG capsule Take 1 tablet every morning.  Marland Kitchen estradiol (VIVELLE-DOT) 0.1 MG/24HR patch Place one patch onto the skin twice weekly. Pls fill with Novan generic.  . fluticasone (FLONASE) 50 MCG/ACT nasal spray Place 2 sprays into the nose daily.  . GuaiFENesin (MUCINEX PO) Take by mouth.  . loratadine (CLARITIN) 10 MG tablet Take 10 mg by mouth daily.  . Multiple Vitamin (MULTI-VITAMIN PO) Take by mouth.  . progesterone (PROMETRIUM) 100 MG capsule Take 1 capsule (100 mg total) by mouth at bedtime.  . pseudoephedrine (SUDAFED) 60 MG tablet Take 60 mg by mouth every 4 (four) hours as needed.    Allergies:   Avelox [moxifloxacin hcl in nacl] and Sulfa antibiotics   Social History   Socioeconomic History  . Marital status: Married    Spouse name: Not on file  . Number of children: 1  . Years of education: Not on file  . Highest education level: Not  on file  Occupational History  . Occupation: Freight forwarder  Tobacco Use  . Smoking status: Never Smoker  . Smokeless tobacco: Never Used  Vaping Use  . Vaping Use: Never used  Substance and Sexual Activity  . Alcohol use: Yes    Alcohol/week: 0.0 standard drinks    Comment: Rare  . Drug use: No  . Sexual activity: Yes    Birth control/protection: Surgical    Comment: Tubal lig-1st intercourse 59 yo-Fewer than 5 partners  Other Topics Concern  . Not on file  Social History Narrative  . Not on file   Social Determinants of Health   Financial Resource Strain: Not on file  Food Insecurity: Not on file  Transportation Needs: Not on file  Physical Activity: Not on file  Stress: Not on  file  Social Connections: Not on file     Family History: The patient's family history includes Cancer in her father, mother, and paternal grandmother; Heart disease in her maternal grandmother and mother; Hypertension in her father and mother. There is no history of Colon cancer, Esophageal cancer, Rectal cancer, or Stomach cancer.  Denies family history of sudden cardiac death including drowning, car accidents, or unexplained deaths in the family. No history hypertrophic cardiomyopathy, left ventricular non-compaction, or arrhythmogenic right ventricular cardiomyopathy; but history of CHF NOS in father No history of bicuspid aortic valve or thoracic aortic aneurysm or dissection; mother died of an abdominal aortic aneurysm. History of coronary artery disease notable for mother (age 20) and grandmother. History of arrhythmia notable for atrial fibrillation in father.  ROS:   Please see the history of present illness.    All other systems reviewed and are negative.  EKGs/Labs/Other Studies Reviewed:    The following studies were reviewed today:  EKG:  EKG is  ordered today.  The ekg ordered today demonstrates SR rate 67 WNL  Recent Labs: 08/15/2019: ALT 11; BUN 20; Creat 0.94; Hemoglobin 11.6; Platelets 200; Potassium 3.9; Sodium 138  Recent Lipid Panel    Component Value Date/Time   CHOL 172 08/15/2019 0940   TRIG 120 08/15/2019 0940   HDL 34 (L) 08/15/2019 0940   CHOLHDL 5.1 (H) 08/15/2019 0940   VLDL 21 09/05/2015 0907   LDLCALC 115 (H) 08/15/2019 0940   Risk Assessment/Calculations:     The 10-year ASCVD risk score Mikey Bussing DC Brooke Bonito., et al., 2013) is: 1.7%   Values used to calculate the score:     Age: 89 years     Sex: Female     Is Non-Hispanic African American: No     Diabetic: No     Tobacco smoker: No     Systolic Blood Pressure: 92 mmHg     Is BP treated: No     HDL Cholesterol: 34 mg/dL     Total Cholesterol: 172 mg/dL   Physical Exam:    VS:  BP 92/68    Pulse 97   Ht _0  (1.626 m)   Wt 171 lb 3.2 oz (77.7 kg)   LMP 10/10/2011   SpO2 97%   BMI 29.39 kg/m     Wt Readings from Last 3 Encounters:  01/12/20 171 lb 3.2 oz (77.7 kg)  01/03/20 173 lb (78.5 kg)  08/15/19 170 lb (77.1 kg)    GEN:  Well nourished, well developed in no acute distress HEENT: Normal NECK: No JVD; No carotid bruits LYMPHATICS: No lymphadenopathy CARDIAC: RRR, no murmurs, rubs, gallops RESPIRATORY:  Clear to auscultation without rales, wheezing or rhonchi  ABDOMEN: Soft, non-tender, non-distended MUSCULOSKELETAL:  No edema; No deformity  SKIN: Warm and dry NEUROLOGIC:  Alert and oriented x 3 PSYCHIATRIC:  Normal affect   ASSESSMENT:    1. Chest pain of uncertain etiology   2. PVC (premature ventricular contraction)   3. Family history of abdominal aortic aneurysm   4. Pre-procedural cardiovascular examination    PLAN:    In order of problems listed above:  Chest Pain HLD - The patient presents with possibly cardiac - with history of recent equivocal stress test in 2020; exercise stress testing would not be appropriate - ASCVD risk estimated at greater than 2.6% in the setting of celiac disease and family history - Would recommend CCTA +/- FFR to exclude obstructive CAD (planned for January)  Family history of abdominal aortic aneurysm - no evidence on 01/17/2018  - will discuss post age 50 screening  PVC - monitor presently, no issues  3-4 follow up unless new symptoms or abnormal test results warranting change in plan  Would be reasonable for  APP Follow up    Medication Adjustments/Labs and Tests Ordered: Current medicines are reviewed at length with the patient today.  Concerns regarding medicines are outlined above.  Orders Placed This Encounter  Procedures  . CT CORONARY FRACTIONAL FLOW RESERVE DATA PREP  . CT CORONARY FRACTIONAL FLOW RESERVE FLUID ANALYSIS  . CT CORONARY MORPH W/CTA COR W/SCORE W/CA W/CM &/OR WO/CM  . Basic  Metabolic Panel (BMET)  . EKG 12-Lead   Meds ordered this encounter  Medications  . metoprolol tartrate (LOPRESSOR) 50 MG tablet    Sig: Take one tablet 50 mg by mouth 2 hours prior to your Cardiac CT.    Dispense:  1 tablet    Refill:  0    Patient Instructions  Medication Instructions:  Your physician recommends that you continue on your current medications as directed. Please refer to the Current Medication list given to you today.  *If you need a refill on your cardiac medications before your next appointment, please call your pharmacy*   Lab Work: Bmet prior to CT   If you have labs (blood work) drawn today and your tests are completely normal, you will receive your results only by: Marland Kitchen MyChart Message (if you have MyChart) OR . A paper copy in the mail If you have any lab test that is abnormal or we need to change your treatment, we will call you to review the results.   Testing/Procedures: Your cardiac CT will be scheduled at the below location:   [facility] [address] [place], [place] [address] [phone]  If scheduled at [facility], please arrive at the [facility] main entrance of [facility] 30 minutes prior to test start time. Proceed to the [facility] Radiology Department (first floor) to check-in and test prep.  Please follow these instructions carefully (unless otherwise directed):   On the Night Before the Test: . Be sure to Drink plenty of water. . Do not consume any caffeinated/decaffeinated beverages or chocolate 12 hours prior to your test. . Do not take any antihistamines 12 hours prior to your test.  On the Day of the Test: . Drink plenty of water. Do not drink any water within one hour of the test. . Do not eat any food 4 hours prior to the test. . You may take your regular medications prior to the test.  . Take metoprolol (Lopressor) two hours prior to test. Sent in to  your pharmacy.  Marland Kitchen HOLD  Furosemide/Hydrochlorothiazide morning of the test. . FEMALES- please wear underwire-free bra if available       After the Test: . Drink plenty of water. . After receiving IV contrast, you may experience a mild flushed feeling. This is normal. . On occasion, you may experience a mild rash up to 24 hours after the test. This is not dangerous. If this occurs, you can take Benadryl 25 mg and increase your fluid intake. . If you experience trouble breathing, this can be serious. If it is severe call 911 IMMEDIATELY. If it is mild, please call our office. . If you take any of these medications: Glipizide/Metformin, Avandament, Glucavance, please do not take 48 hours after completing test unless otherwise instructed.   Once we have confirmed authorization from your insurance company, we will call you to set up a date and time for your test. Based on how quickly your insurance processes prior authorizations requests, please allow up to 4 weeks to be contacted for scheduling your Cardiac CT appointment. Be advised that routine Cardiac CT appointments could be scheduled as many as 8 weeks after your provider has ordered it.  For non-scheduling related questions, please contact the cardiac imaging nurse navigator should you have any questions/concerns: Marchia Bond, Cardiac Imaging Nurse Navigator Burley Saver, Interim Cardiac Imaging Nurse [place] and Vascular Services Direct Office Dial: [phone]   For scheduling needs, including cancellations and rescheduling, please call [place], [phone].     Follow-Up: At [facility], you and your health needs are our priority.  As part of our continuing mission to provide you with exceptional heart care, we have created designated Provider Care Teams.  These Care Teams include your primary Cardiologist (physician) and Advanced Practice Providers (APPs -  Physician Assistants and Nurse Practitioners) who all work together to provide  you with the care you need, when you need it.  We recommend signing up for the patient portal called "MyChart".  Sign up information is provided on this After Visit Summary.  MyChart is used to connect with patients for Virtual Visits (Telemedicine).  Patients are able to view lab/test results, encounter notes, upcoming appointments, etc.  Non-urgent messages can be sent to your provider as well.   To learn more about what you can do with MyChart, go to [email or web address].    Your next appointment:   4 month(s)  The format for your next appointment:   In Person  Provider:   Rudean Haskell, MD   Other Instructions      Signed, Werner Lean, MD  01/12/2020 10:11 AM    [place]

## 2020-01-12 NOTE — Patient Instructions (Signed)
Medication Instructions:  Your physician recommends that you continue on your current medications as directed. Please refer to the Current Medication list given to you today.  *If you need a refill on your cardiac medications before your next appointment, please call your pharmacy*   Lab Work: Bmet prior to CT   If you have labs (blood work) drawn today and your tests are completely normal, you will receive your results only by: Marland Kitchen MyChart Message (if you have MyChart) OR . A paper copy in the mail If you have any lab test that is abnormal or we need to change your treatment, we will call you to review the results.   Testing/Procedures: Your cardiac CT will be scheduled at the below location:   [facility] [address] [place], [place] [address] [phone]  If scheduled at [facility], please arrive at the [facility] main entrance of [facility] 30 minutes prior to test start time. Proceed to the [facility] Radiology Department (first floor) to check-in and test prep.  Please follow these instructions carefully (unless otherwise directed):   On the Night Before the Test: . Be sure to Drink plenty of water. . Do not consume any caffeinated/decaffeinated beverages or chocolate 12 hours prior to your test. . Do not take any antihistamines 12 hours prior to your test.  On the Day of the Test: . Drink plenty of water. Do not drink any water within one hour of the test. . Do not eat any food 4 hours prior to the test. . You may take your regular medications prior to the test.  . Take metoprolol (Lopressor) two hours prior to test. Sent in to your pharmacy.  Marland Kitchen HOLD Furosemide/Hydrochlorothiazide morning of the test. . FEMALES- please wear underwire-free bra if available       After the Test: . Drink plenty of water. . After receiving IV contrast, you may experience a mild flushed feeling. This is normal. . On occasion, you may experience a mild  rash up to 24 hours after the test. This is not dangerous. If this occurs, you can take Benadryl 25 mg and increase your fluid intake. . If you experience trouble breathing, this can be serious. If it is severe call 911 IMMEDIATELY. If it is mild, please call our office. . If you take any of these medications: Glipizide/Metformin, Avandament, Glucavance, please do not take 48 hours after completing test unless otherwise instructed.   Once we have confirmed authorization from your insurance company, we will call you to set up a date and time for your test. Based on how quickly your insurance processes prior authorizations requests, please allow up to 4 weeks to be contacted for scheduling your Cardiac CT appointment. Be advised that routine Cardiac CT appointments could be scheduled as many as 8 weeks after your provider has ordered it.  For non-scheduling related questions, please contact the cardiac imaging nurse navigator should you have any questions/concerns: Marchia Bond, Cardiac Imaging Nurse Navigator Burley Saver, Interim Cardiac Imaging Nurse [place] and Vascular Services Direct Office Dial: [phone]   For scheduling needs, including cancellations and rescheduling, please call [place], [phone].     Follow-Up: At [facility], you and your health needs are our priority.  As part of our continuing mission to provide you with exceptional heart care, we have created designated Provider Care Teams.  These Care Teams include your primary Cardiologist (physician) and Advanced Practice Providers (APPs -  Physician  Assistants and Nurse Practitioners) who all work together to provide you with the care you need, when you need it.  We recommend signing up for the patient portal called "MyChart".  Sign up information is provided on this After Visit Summary.  MyChart is used to connect with patients for Virtual Visits (Telemedicine).  Patients are able to view lab/test  results, encounter notes, upcoming appointments, etc.  Non-urgent messages can be sent to your provider as well.   To learn more about what you can do with MyChart, go to [email or web address].    Your next appointment:   4 month(s)  The format for your next appointment:   In Person  Provider:   Rudean Haskell, MD   Other Instructions

## 2020-01-19 ENCOUNTER — Encounter: Payer: Self-pay | Admitting: Nurse Practitioner

## 2020-02-04 ENCOUNTER — Other Ambulatory Visit: Payer: Self-pay | Admitting: Gastroenterology

## 2020-02-20 ENCOUNTER — Ambulatory Visit: Payer: BC Managed Care – PPO | Admitting: Allergy & Immunology

## 2020-03-07 ENCOUNTER — Telehealth (INDEPENDENT_AMBULATORY_CARE_PROVIDER_SITE_OTHER): Payer: BC Managed Care – PPO | Admitting: Physician Assistant

## 2020-03-07 ENCOUNTER — Encounter: Payer: Self-pay | Admitting: Physician Assistant

## 2020-03-07 DIAGNOSIS — K900 Celiac disease: Secondary | ICD-10-CM

## 2020-03-07 DIAGNOSIS — Z86010 Personal history of colonic polyps: Secondary | ICD-10-CM

## 2020-03-07 MED ORDER — DEXLANSOPRAZOLE 60 MG PO CPDR: DELAYED_RELEASE_CAPSULE | ORAL | 4 refills | Status: AC

## 2020-03-07 NOTE — Patient Instructions (Signed)
If you are age 58 or older, your body mass index should be between 23-30. Your There is no height or weight on file to calculate BMI. If this is out of the aforementioned range listed, please consider follow up with your Primary Care Provider.  If you are age 88 or younger, your body mass index should be between 19-25. Your There is no height or weight on file to calculate BMI. If this is out of the aformentioned range listed, please consider follow up with your Primary Care Provider.   Continue Dexilant 60 mg 1 capsule every morning morning has been sent to your pharmacy.  You have a recall for your in for your Colonoscopy with Dr. Ardis Hughs in January of 2023.  Follow up in 10 months.  Thank you for entrusting me with your care and choosing [facility].  Amy Esterwood, PA-C

## 2020-03-07 NOTE — Progress Notes (Signed)
Subjective:    Patient ID: Phyllis Morgan, female    DOB: [birth year], 60 y.o.   MRN: [id number]  HPI  This service was provided via telemedicine.  Telephone call -  The patient was located at home. The provider was located in provider's GI office. The patient did consent to this telephone visit and is aware of possible charges with her insurance for this visit. To persons participating in this telemedicine service were the patient and I. Time spent on call; 7 minutes  Phyllis Morgan is a pleasant 60 year old white female, established with Dr. Ardis Hughs with history of chronic GERD, and celiac disease which has been well controlled on gluten-free diet.  She also has history of adenomatous colon polyps. She is here today for medication refill.  She has been maintained on Dexilant 60 mg p.o. every morning over the past several years.  She says as long as she stays on her medication she does well but if she skips a dose she generally will quickly develop recurrent epigastric burning.  On medication she has no symptoms, denies any heartburn or indigestion no dysphagia or odynophagia. She has no symptoms today referable to celiac disease and continues on a strict gluten-free diet because she says she feels best gluten-free. Her last EGD was done in November 2019 which was normal, the duodenum appeared normal and biopsy from the small bowel was unremarkable with no evidence of villous atrophy.  She was noted to have a couple of small fundic gland polyps, esophagus normal.  Biopsy from the gastric antrum showed chronic inactive gastritis. Last colonoscopy January 2018 with removal of 2 subcentimeter polyps, 1 of which was a tubular adenoma and one a sessile serrated adenoma.  She is indicated for 5-year interval follow-up.  Review of Systems Pertinent positive and negative review of systems were noted in the above HPI section.  All other review of systems was otherwise negative.  Outpatient Encounter Medications  as of 03/07/2020  Medication Sig  . albuterol (VENTOLIN HFA) 108 (90 Base) MCG/ACT inhaler Inhale 2 puffs into the lungs every 6 (six) hours as needed for wheezing or shortness of breath.  . Cholecalciferol (VITAMIN D PO) Take by mouth.  . Cholecalciferol 50 MCG (2000 UT) TABS Take 1 tablet by mouth daily.  Marland Kitchen dexlansoprazole (DEXILANT) 60 MG capsule TAKE 1 CAPSULE BY MOUTH EVERY MORNING  . estradiol (VIVELLE-DOT) 0.1 MG/24HR patch Place one patch onto the skin twice weekly. Pls fill with Novan generic.  . fluticasone (FLONASE) 50 MCG/ACT nasal spray Place 2 sprays into the nose daily.  . GuaiFENesin (MUCINEX PO) Take by mouth.  . loratadine (CLARITIN) 10 MG tablet Take 10 mg by mouth daily.  . metoprolol tartrate (LOPRESSOR) 50 MG tablet Take one tablet 50 mg by mouth 2 hours prior to your Cardiac CT.  . Multiple Vitamin (MULTI-VITAMIN PO) Take by mouth.  . progesterone (PROMETRIUM) 100 MG capsule Take 1 capsule (100 mg total) by mouth at bedtime.  . pseudoephedrine (SUDAFED) 60 MG tablet Take 60 mg by mouth every 4 (four) hours as needed.  . [DISCONTINUED] dexlansoprazole (DEXILANT) 60 MG capsule TAKE 1 CAPSULE BY MOUTH EVERY MORNING   No facility-administered encounter medications on file as of 03/07/2020.   Allergies  Allergen Reactions  . Avelox [Moxifloxacin Hcl In Nacl] Nausea Only  . Sulfa Antibiotics Other (See Comments)    Blurred vision   Patient Active Problem List   Diagnosis Date Noted  . PVC (premature ventricular contraction) 01/12/2020  . Chest  pain of uncertain etiology 00/17/4944  . Family history of abdominal aortic aneurysm 01/12/2020  . Chronic bronchitis, unspecified chronic bronchitis type ([employer]) 01/03/2020  . Esophageal reflux 07/03/2015  . 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 Seasonal allergies   . Celiac disease 02/03/2011   Social History   Socioeconomic History  . Marital status: Married    Spouse name: Not on file  . Number of children: 1  . Years of education:  Not on file  . Highest education level: Not on file  Occupational History  . Occupation: Freight forwarder  Tobacco Use  . Smoking status: Never Smoker  . Smokeless tobacco: Never Used  Vaping Use  . Vaping Use: Never used  Substance and Sexual Activity  . Alcohol use: Yes    Alcohol/week: 0.0 standard drinks    Comment: Rare  . Drug use: No  . Sexual activity: Yes    Birth control/protection: Surgical    Comment: Tubal lig-1st intercourse 60 yo-Fewer than 5 partners  Other Topics Concern  . Not on file  Social History Narrative  . Not on file   Social Determinants of Health   Financial Resource Strain: Not on file  Food Insecurity: Not on file  Transportation Needs: Not on file  Physical Activity: Not on file  Stress: Not on file  Social Connections: Not on file  Intimate Partner Violence: Not on file    Phyllis Morgan's family history includes Cancer in her father, mother, and paternal grandmother; Heart disease in her maternal grandmother and mother; Hypertension in her father and mother.      Objective:    There were no vitals filed for this visit.  Physical Exam not examined - virtual visit       Assessment & Plan:   #1 60 year old white female with history of chronic GERD, well controlled on Dexilant 60 mg p.o. daily #2 celiac disease-well-controlled on gluten-free diet with no evidence of disease activity on small bowel biopsies November 2019 #3 history of adenomatous and sessile serrated polyps.  Up-to-date with colonoscopy last done January 2018 and due for interval 5-year follow-up January 2023  Plan; continue Dexilant 60 mg p.o. every morning, refill x1 year Continue gluten-free diet Patient is aware she is due for follow-up colonoscopy in January 2023, and may try to complete this towards the end of this year.  Recall placed Patient will follow up with Dr. Ardis Hughs or myself on an as-needed basis.  Theodus Ran S Phyllis Herd PA-C 03/07/2020   Cc: Marrian Salvage,*

## 2020-04-01 ENCOUNTER — Telehealth (HOSPITAL_COMMUNITY): Payer: Self-pay | Admitting: Emergency Medicine

## 2020-04-01 ENCOUNTER — Telehealth (HOSPITAL_COMMUNITY): Payer: Self-pay | Admitting: *Deleted

## 2020-04-01 MED ORDER — IVABRADINE HCL 5 MG PO TABS: 5.0000 mg | ORAL_TABLET | Freq: Two times a day (BID) | ORAL | 0 refills | Status: DC

## 2020-04-01 NOTE — Telephone Encounter (Signed)
Reaching out to patient to offer assistance regarding upcoming cardiac imaging study; pt verbalizes understanding of appt date/time, parking situation and where to check in, pre-test NPO status, and verified current allergies; name and call back number provided for further questions should they arise  Gordy Clement RN Navigator Cardiac [place] and Vascular [phone] office [phone] cell  Pt did not get any metoprolol but due to HR and BP, pt informed we would reach out to Dr. Gasper Sells to see if other medications would be more appropriate for this test. Pt verbalized understanding and would wait for a return call.

## 2020-04-01 NOTE — Telephone Encounter (Signed)
Attempted to call patient regarding upcoming cardiac CT appointment. °Left message on voicemail with name and callback number °Nat Lowenthal RN Navigator Cardiac Imaging °[facility] °[phone] Office °[phone] Cell ° °

## 2020-04-01 NOTE — Telephone Encounter (Signed)
Sample left at the front desk of [address] office

## 2020-04-01 NOTE — Progress Notes (Signed)
Opened in error

## 2020-04-01 NOTE — Addendum Note (Signed)
Addended by: Marcelle Overlie D on: 04/01/2020 11:59 AM   Modules accepted: Orders

## 2020-04-01 NOTE — Progress Notes (Signed)
Reaching out to pt regarding medications for cardiac CT.  Per Dr. Gasper Sells, pt to take 80m ivabradine two hours prior to cardiac CT.  Due in time constraints, pt to obtain sample from cardiology office. Pt verbalized understanding.  MEli HoseOffice: [phone]

## 2020-04-02 ENCOUNTER — Ambulatory Visit (HOSPITAL_COMMUNITY)
Admission: RE | Admit: 2020-04-02 | Discharge: 2020-04-02 | Disposition: A | Discharge status: Home / Self Care | Payer: BC Managed Care – PPO | Source: Ambulatory Visit | Attending: Internal Medicine | Admitting: Internal Medicine

## 2020-04-02 ENCOUNTER — Other Ambulatory Visit: Payer: Self-pay

## 2020-04-02 ENCOUNTER — Encounter (HOSPITAL_COMMUNITY): Payer: Self-pay

## 2020-04-02 DIAGNOSIS — R079 Chest pain, unspecified: Secondary | ICD-10-CM | POA: Diagnosis not present

## 2020-04-02 DIAGNOSIS — Z006 Encounter for examination for normal comparison and control in clinical research program: Secondary | ICD-10-CM

## 2020-04-02 DIAGNOSIS — Z8249 Family history of ischemic heart disease and other diseases of the circulatory system: Secondary | ICD-10-CM | POA: Diagnosis present

## 2020-04-02 DIAGNOSIS — I493 Ventricular premature depolarization: Secondary | ICD-10-CM | POA: Insufficient documentation

## 2020-04-02 MED ORDER — NITROGLYCERIN 0.4 MG SL SUBL
0.8000 mg | SUBLINGUAL_TABLET | Freq: Once | SUBLINGUAL | Status: AC
  Administered 2020-04-02: 0.8 mg via SUBLINGUAL

## 2020-04-02 MED ORDER — NITROGLYCERIN 0.4 MG SL SUBL
SUBLINGUAL_TABLET | SUBLINGUAL | Status: AC
  Filled 2020-04-02: qty 2

## 2020-04-02 MED ORDER — IOHEXOL 350 MG/ML SOLN
80.0000 mL | Freq: Once | INTRAVENOUS | Status: AC | PRN
  Administered 2020-04-02: 80 mL via INTRAVENOUS

## 2020-04-02 NOTE — Research (Signed)
IDENTIFY Informed Consent  °  °  °  °  °  °  °  °  °Subject Name: Phyllis Morgan  ° ° °Subject met inclusion and exclusion criteria.  The informed consent form, study requirements and expectations were reviewed with the subject and questions and concerns were addressed prior to the signing of the consent form.  The subject verbalized understanding of the trial requirements.  The subject agreed to participate in the IDENTIFY trial and signed the informed consent at 10:40AM on 04/02/20.  The informed consent was obtained prior to performance of any protocol-specific procedures for the subject.  A copy of the signed informed consent was given to the subject and a copy was placed in the subject's medical record. °  °Cindy Johnson, Research Assistant ° °

## 2020-04-03 ENCOUNTER — Encounter: Payer: Self-pay | Admitting: *Deleted

## 2020-04-03 DIAGNOSIS — R911 Solitary pulmonary nodule: Secondary | ICD-10-CM

## 2020-04-04 ENCOUNTER — Other Ambulatory Visit: Payer: Self-pay

## 2020-04-04 DIAGNOSIS — R911 Solitary pulmonary nodule: Secondary | ICD-10-CM

## 2020-04-04 NOTE — Progress Notes (Signed)
Placed an order for referral to Pulmonology clinic per Dr. Gasper Sells for  pulmonary nodule discovered during Cardiac CT.

## 2020-04-16 ENCOUNTER — Ambulatory Visit: Payer: BC Managed Care – PPO | Admitting: Allergy & Immunology

## 2020-04-24 ENCOUNTER — Ambulatory Visit: Payer: BC Managed Care – PPO | Admitting: Internal Medicine

## 2020-05-20 ENCOUNTER — Ambulatory Visit: Payer: BC Managed Care – PPO | Admitting: Internal Medicine

## 2020-05-27 ENCOUNTER — Telehealth: Payer: Self-pay

## 2020-05-27 ENCOUNTER — Encounter: Payer: Self-pay | Admitting: Family Medicine

## 2020-05-27 ENCOUNTER — Telehealth (INDEPENDENT_AMBULATORY_CARE_PROVIDER_SITE_OTHER): Payer: BC Managed Care – PPO | Admitting: Family Medicine

## 2020-05-27 ENCOUNTER — Other Ambulatory Visit: Payer: Self-pay

## 2020-05-27 DIAGNOSIS — J0141 Acute recurrent pansinusitis: Secondary | ICD-10-CM | POA: Diagnosis not present

## 2020-05-27 MED ORDER — FLUCONAZOLE 150 MG PO TABS: ORAL_TABLET | ORAL | 0 refills | Status: DC

## 2020-05-27 MED ORDER — AMOXICILLIN-POT CLAVULANATE 875-125 MG PO TABS
1.0000 | ORAL_TABLET | Freq: Two times a day (BID) | ORAL | 0 refills | Status: AC
Start: 2020-05-27 — End: 2020-06-03

## 2020-05-27 NOTE — Progress Notes (Signed)
Chief Complaint  Patient presents with  . Sinusitis    [name] here for URI complaints. Due to COVID-19 pandemic, we are interacting via web portal for an electronic face-to-face visit. I verified patient's ID using 2 identifiers. Patient agreed to proceed with visit via this method. Patient is at home, I am at office. Patient and I are present for visit.   Duration: 10 days  Associated symptoms: sinus congestion, sinus pain, rhinorrhea, itchy watery eyes, ear fullness, sore throat, wheezing and coughing Denies: ear pain, ear drainage, shortness of breath, myalgia and fevers Treatment to date: Mucinex, INCS,  Sick contacts: No  Past Medical History:  Diagnosis Date  . Biliary dyskinesia   . Celiac disease   . Celiac disease 2013  . Colon polyps   . Gastric polyps   . GERD (gastroesophageal reflux disease)   . IDA (iron deficiency anemia)   . Osteopenia 11/2018   T score -1.3 FRAX 6.5% / 0.4% stable from prior DEXA  . Seasonal allergies    Objective No conversational dyspnea Age appropriate judgment and insight Nml affect and mood  Acute recurrent pansinusitis - Plan: fluconazole (DIFLUCAN) 150 MG tablet, amoxicillin-clavulanate (AUGMENTIN) 875-125 MG tablet  Continue to push fluids, practice good hand hygiene, cover mouth when coughing. F/u prn. If starting to experience fevers, shaking, or shortness of breath, seek immediate care. Pt voiced understanding and agreement to the plan.  [place], DO 05/27/20 10:27 AM

## 2020-05-27 NOTE — Telephone Encounter (Signed)
Pt scheduled for Mychart visit already.  [facility] - Client Client Site [facility] - Night Relationship To Patient Self Return Phone Number [phone] (Primary) Chief Complaint WHEEZING Reason for Call Request to Schedule Office Appointment She is having congested, cough, wheezing, ear issues, almost covid19 like symptoms. She sees NP [place] who is new to the practice. Nurse: Rene Kocher, RN, [place] Date/Time (Eastern Time): 05/27/2020 7:54:45 AM Confirm and document reason for call. If symptomatic, describe symptoms. ---caller states she has cough and congestion. wheezing. facial pain. no fever Does the patient have any new or worsening symptoms? ---Yes Will a triage be completed? ---Yes Related visit to physician within the last 2 weeks? ---No Does the PT have any chronic conditions? (i.e. diabetes, asthma, this includes High risk factors for pregnancy, etc.) ---No Is this a behavioral health or substance abuse call? ---No Guidelines Guideline Title Affirmed Question Affirmed Notes Nurse Date/Time Eilene Ghazi Time) Sinus Pain or Congestion [1] Redness or swelling on the cheek, forehead or around the eye AND [2] no fever Coulter, RN, [facility] 05/27/2020 7:55:57

## 2020-06-03 ENCOUNTER — Telehealth: Payer: Self-pay | Admitting: Emergency Medicine

## 2020-06-03 NOTE — Telephone Encounter (Signed)
Spoke with pt who states that she has never had a fever or a productive cough. She states having sinus congestion and slight chest congestion. Pt took home Covid test yesterday which was negative. Pt instructed as long as she does not have a fever and is asymptomatic then she can come in office for consult tomorrow. Pt states understanding. Nothing further needed at this time.

## 2020-06-04 ENCOUNTER — Ambulatory Visit: Payer: BC Managed Care – PPO | Admitting: Emergency Medicine

## 2020-06-04 ENCOUNTER — Other Ambulatory Visit: Payer: Self-pay

## 2020-06-04 ENCOUNTER — Encounter: Payer: Self-pay | Admitting: Emergency Medicine

## 2020-06-04 DIAGNOSIS — R911 Solitary pulmonary nodule: Secondary | ICD-10-CM | POA: Diagnosis not present

## 2020-06-04 DIAGNOSIS — J42 Unspecified chronic bronchitis: Secondary | ICD-10-CM | POA: Diagnosis not present

## 2020-06-04 NOTE — Assessment & Plan Note (Signed)
3.8 mm isolated pulmonary nodule in the mid right upper lobe seen on cardiac CT.  No dedicated follow-up is absolutely indicated for this nodule based on her risk profile and the size of the nodule.  That said we have not seen the apices.  I explained to her that we could either defer any repeat imaging or we could consider a full CT chest to ensure no other nodules that do require follow-up.  She can think about this.  If she is concerned, wants to have a full CT chest then she will contact me and we will arrange for it.

## 2020-06-04 NOTE — Progress Notes (Signed)
Subjective:    Patient ID: Phyllis Morgan, female    DOB: Aug 05, 1960, 60 y.o.   MRN: [id number]  HPI 60 year old woman, never smoker, with a history of celiac disease, GERD, biliary dyskinesia, seasonal allergies.  Recently treated for a suspected pansinusitis.  She had pneumonia as a child. She is referred today for evaluation of a pulmonary nodule seen on coronary calcium screening CT scan that was ordered for chest discomfort. Good exertional tolerance, able to walk 30 minutes daily, does yoga. She is asymptomatic.   CT chest 04/02/2020 reviewed by me was a coronary calcium scoring CT, showed a 4 mm subpleural medial right upper lobe nodule  Review of Systems As per HPI  Past Medical History:  Diagnosis Date  . Biliary dyskinesia   . Celiac disease   . Celiac disease 2013  . Colon polyps   . Gastric polyps   . GERD (gastroesophageal reflux disease)   . IDA (iron deficiency anemia)   . Osteopenia 11/2018   T score -1.3 FRAX 6.5% / 0.4% stable from prior DEXA  . Seasonal allergies      Family History  Problem Relation Age of Onset  . Hypertension Mother   . Heart disease Mother   . Cancer Mother        SKIN CANCER  . Hypertension Father   . Cancer Father        COLON  . Heart disease Maternal Grandmother   . Cancer Paternal Grandmother        Female cancer  ?type  . Colon cancer Neg Hx   . Esophageal cancer Neg Hx   . Rectal cancer Neg Hx   . Stomach cancer Neg Hx     No family history of lung cancer  Social History   Socioeconomic History  . Marital status: Married    Spouse name: Not on file  . Number of children: 1  . Years of education: Not on file  . Highest education level: Not on file  Occupational History  . Occupation: Freight forwarder  Tobacco Use  . Smoking status: Never Smoker  . Smokeless tobacco: Never Used  Vaping Use  . Vaping Use: Never used  Substance and Sexual Activity  . Alcohol use: Yes    Alcohol/week: 0.0 standard drinks    Comment:  Rare  . Drug use: No  . Sexual activity: Yes    Birth control/protection: Surgical    Comment: Tubal lig-1st intercourse 60 yo-Fewer than 5 partners  Other Topics Concern  . Not on file  Social History Narrative  . 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  [place] native    Allergies  Allergen Reactions  . Avelox [Moxifloxacin Hcl In Nacl] Nausea Only  . Sulfa Antibiotics Other (See Comments)    Blurred vision     Outpatient Medications Prior to Visit  Medication Sig Dispense Refill  . albuterol (VENTOLIN HFA) 108 (90 Base) MCG/ACT inhaler Inhale 2 puffs into the lungs every 6 (six) hours as needed for wheezing or shortness of breath. 18 g 0  .  Cholecalciferol (VITAMIN D PO) Take by mouth.    . Cholecalciferol 50 MCG (2000 UT) TABS Take 1 tablet by mouth daily.    Marland Kitchen dexlansoprazole (DEXILANT) 60 MG capsule TAKE 1 CAPSULE BY MOUTH EVERY MORNING 90 capsule 4  . estradiol (VIVELLE-DOT) 0.1 MG/24HR patch Place one patch onto the skin twice weekly. Pls fill with Novan generic. 24 patch 4  . fluconazole (DIFLUCAN) 150 MG tablet Take 1 tab, repeat in 48 hours if no improvement. 2 tablet 0  . fluticasone (FLONASE) 50 MCG/ACT nasal spray Place 2 sprays into the nose daily.    . GuaiFENesin (MUCINEX PO) Take by mouth.    . ivabradine (CORLANOR) 5 MG TABS tablet Take 1 tablet (5 mg total) by mouth 2 (two) times daily with a meal. 2 tablet 0  . loratadine (CLARITIN) 10 MG tablet Take 10 mg by mouth daily.    . metoprolol tartrate (LOPRESSOR) 50 MG tablet Take one tablet 50 mg by mouth 2 hours prior to your Cardiac  CT. 1 tablet 0  . Multiple Vitamin (MULTI-VITAMIN PO) Take by mouth.    . progesterone (PROMETRIUM) 100 MG capsule Take 1 capsule (100 mg total) by mouth at bedtime. 90 capsule 3  . pseudoephedrine (SUDAFED) 60 MG tablet Take 60 mg by mouth every 4 (four) hours as needed.     No facility-administered medications prior to visit.        Objective:   Physical Exam  Vitals:   06/04/20 1417  BP: 108/68  Pulse: 79  Temp: 97.8 F (36.6 C)  TempSrc: Temporal  SpO2: 99%  Weight: 170 lb 6.4 oz (77.3 kg)  Height: 5' 4"  (1.626 m)   Gen: Pleasant, well-nourished, in no distress,  normal affect  ENT: No lesions,  mouth clear,  oropharynx clear, no postnasal drip  Neck: No JVD, no stridor  Lungs: No use of accessory muscles, no crackles or wheezing on normal respiration, no wheeze on forced expiration  Cardiovascular: RRR, heart sounds normal, no murmur or gallops, no peripheral edema  Musculoskeletal: No deformities, no cyanosis or clubbing  Neuro: alert, awake, non focal  Skin: Warm, no lesions or rash      Assessment & Plan:  Chronic bronchitis, unspecified chronic bronchitis type (HCC) With intermittent symptoms.  If these progress, become more prominent then she may benefit from PFT and further evaluation for possible obstructive lung disease.  Explained to her today that we would be happy to set this up if she wants to do so in the future.  Solitary pulmonary nodule 3.8 mm isolated pulmonary nodule in the mid right upper lobe seen on cardiac CT.  No dedicated follow-up is absolutely indicated for this nodule based on her risk profile and the size of the nodule.  That said we have not seen the apices.  I explained to her that we could either defer any repeat imaging or we could consider a full CT chest to ensure no other nodules that do require follow-up.  She can think about this.  If she is concerned, wants to have a full CT chest then she will contact me and we will arrange  for it.  Baltazar Apo, MD, PhD 06/04/2020, 2:42 PM [facility] [phone] or if no answer before 7:00PM call [phone] For any issues after 7:00PM please call eLink [phone]

## 2020-06-04 NOTE — Patient Instructions (Signed)
There is a small mid right upper lobe pulmonary nodule on your CT scan of the chest.  This is most likely benign and does not necessarily require any dedicated follow-up unless you develop symptoms of any kind because you fall into the low risk category. Please contact our office if you decide you want to have a full CT chest to evaluate all lung tissue completely. Contact our office if you have changes in cough, breathing, bronchitis. Follow with Dr. Lamonte Sakai if needed

## 2020-06-04 NOTE — Assessment & Plan Note (Signed)
With intermittent symptoms.  If these progress, become more prominent then she may benefit from PFT and further evaluation for possible obstructive lung disease.  Explained to her today that we would be happy to set this up if she wants to do so in the future.

## 2020-07-04 IMAGING — CT CT ABD-PELV W/ CM
2 of 5 series · 16 of 46 positions shown, 18 images · IV contrast (ISOVUE 300)
Comparison: Noncontrast CT on 12/18/2009 from [facility]

CLINICAL DATA: Abdominal pain and diarrhea for several months.

EXAM:
CT ABDOMEN AND PELVIS WITH CONTRAST
TECHNIQUE: Multidetector CT imaging of the abdomen and pelvis was performed
using the standard protocol following bolus administration of
intravenous contrast.
CONTRAST:  100mL 6ALR71-H00 IOPAMIDOL (6ALR71-H00) INJECTION 61%

[Series 2: abd/pel w · axial · 0.74mm/px · z∈[-399,-9]mm · 13 of 88 slices shown, 15 images]
[im 5/88  soft-tissue]
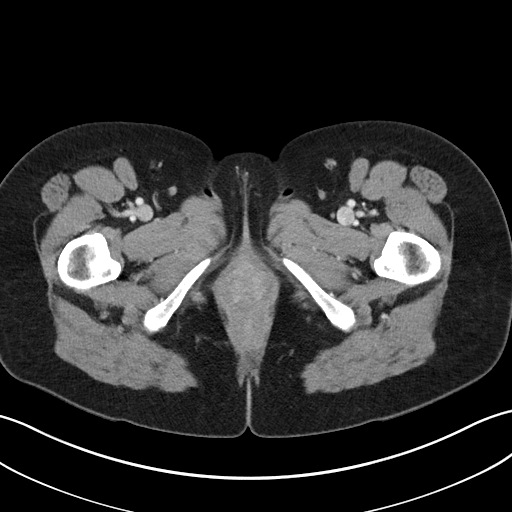
[im 5/88  bone]
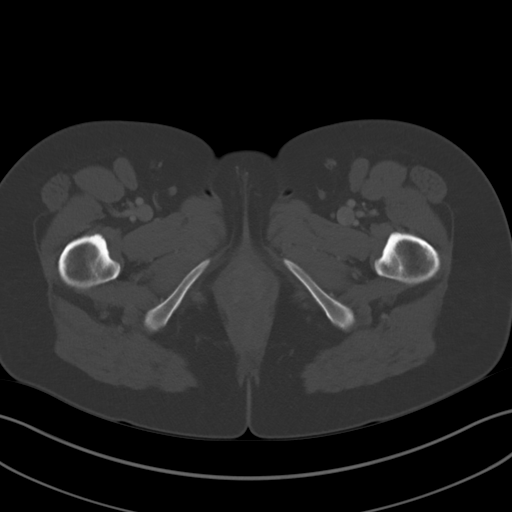
[im 14/88  soft-tissue]
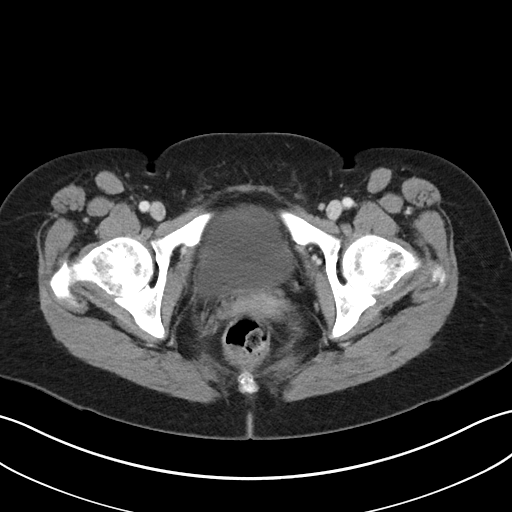
[im 19/88  soft-tissue]
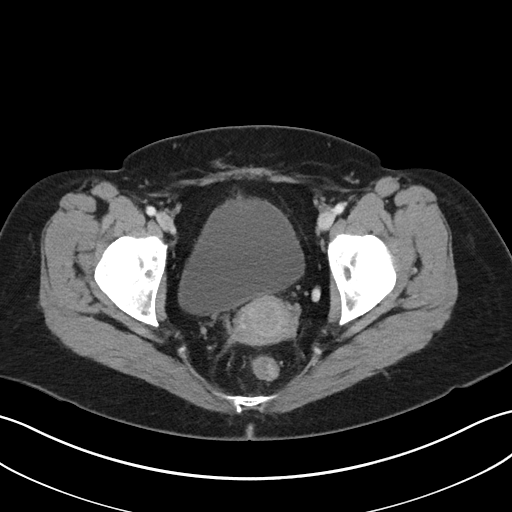
[im 23/88  soft-tissue]
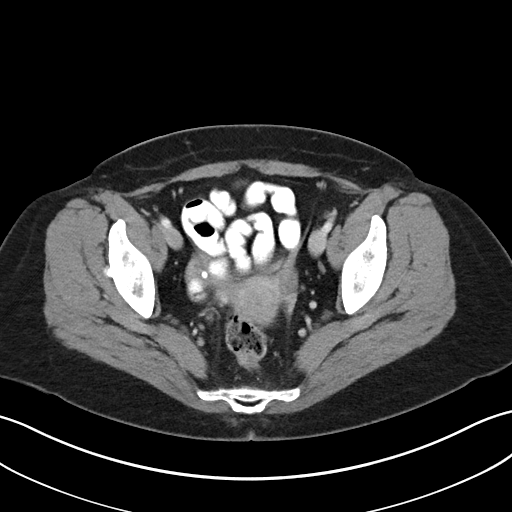
[im 33/88  soft-tissue]
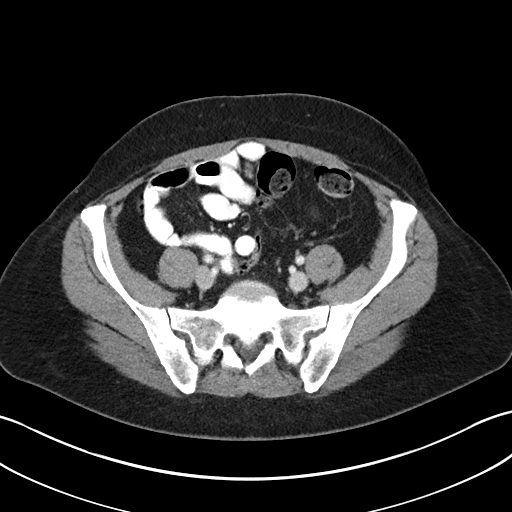
[im 37/88  soft-tissue]
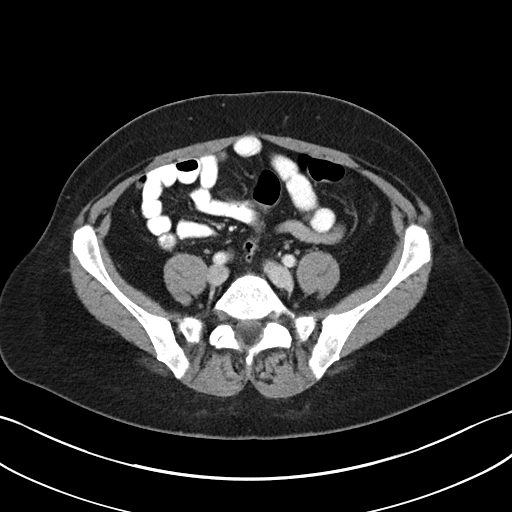
[im 46/88  soft-tissue]
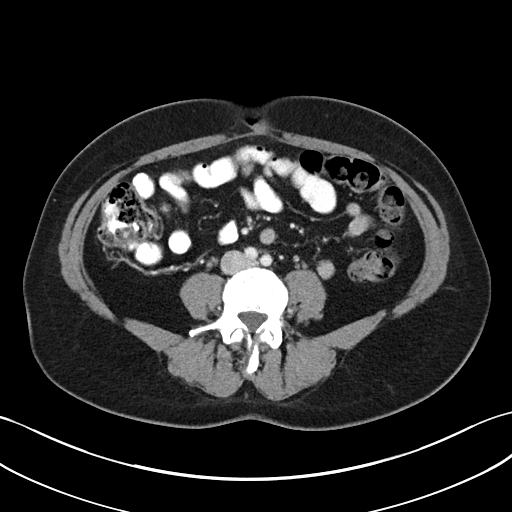
[im 51/88  soft-tissue]
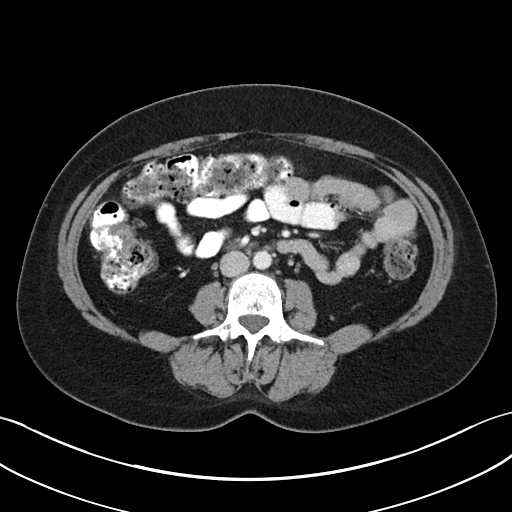
[im 55/88  soft-tissue]
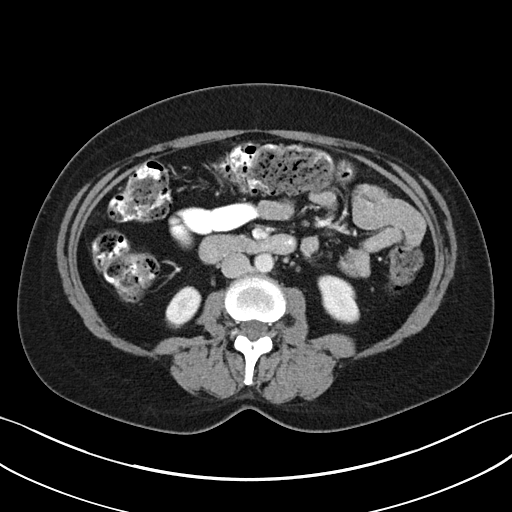
[im 55/88  bone]
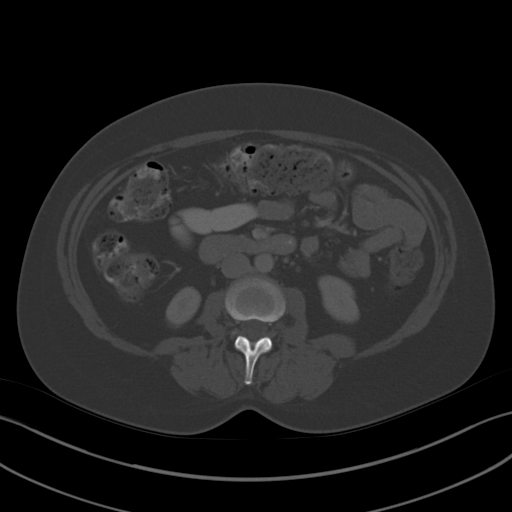
[im 65/88  soft-tissue]
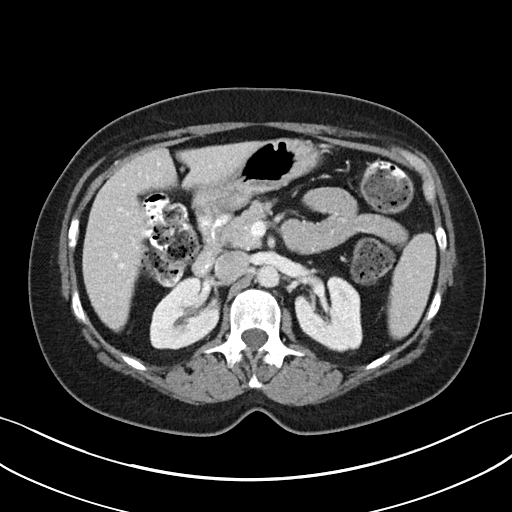
[im 69/88  soft-tissue]
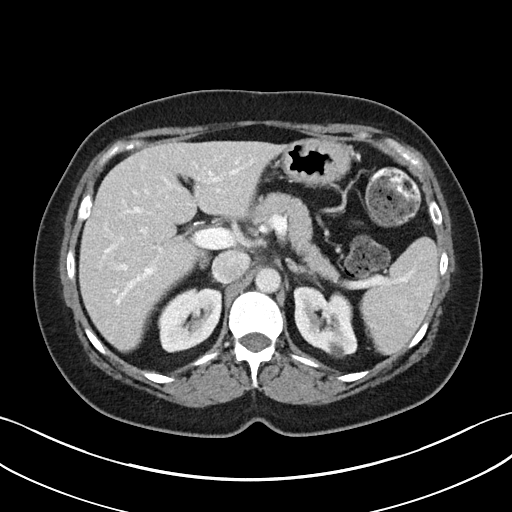
[im 74/88  soft-tissue]
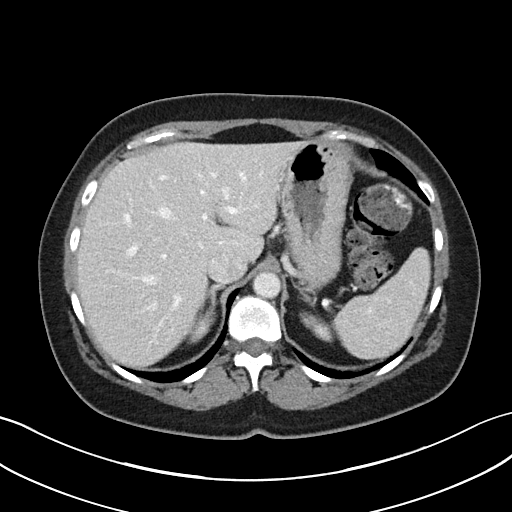
[im 83/88  soft-tissue]
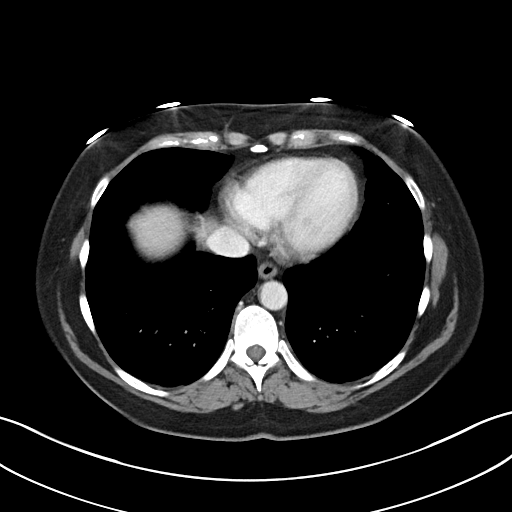

[Series 5: abd/pel w st · coronal · 0.68mm/px · 3 of 81 slices shown]
[im 27/81  soft-tissue]
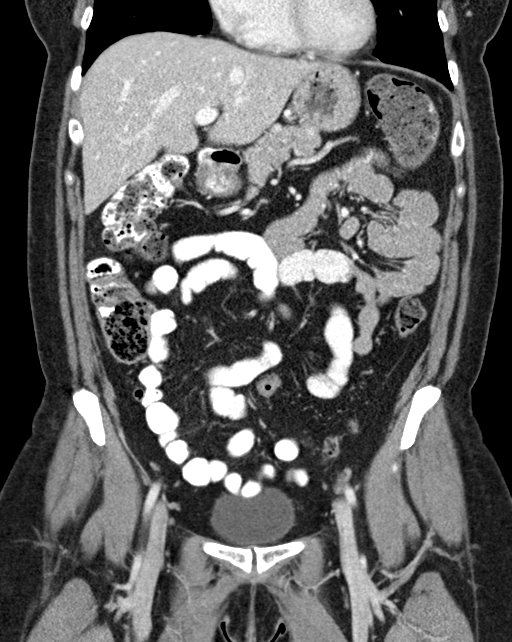
[im 36/81  soft-tissue]
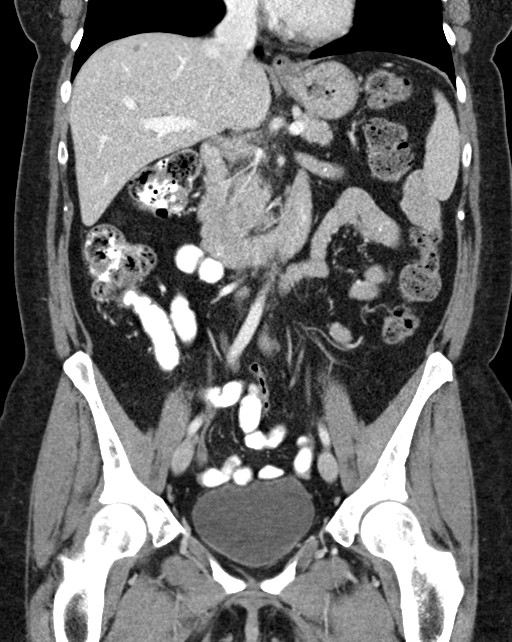
[im 45/81  soft-tissue]
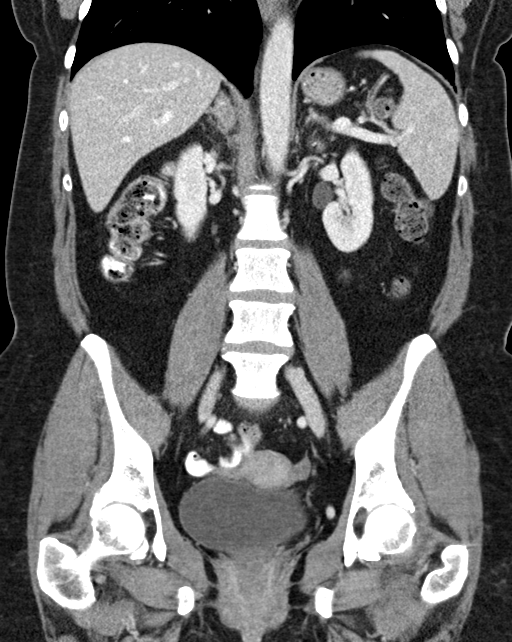

[16 of 46 positions shown; findings below may reference images not displayed]

FINDINGS: Lower Chest: No acute findings.

Hepatobiliary: No hepatic masses identified. Probable tiny sub-cm
cyst in anterior right lobe. Prior cholecystectomy. No evidence of
biliary obstruction.

Pancreas:  No mass or inflammatory changes.

Spleen: Within normal limits in size and appearance.

Adrenals/Urinary Tract: No masses identified. Small left renal cyst
noted. No evidence of hydronephrosis. Unremarkable unopacified
urinary bladder.

Stomach/Bowel: No evidence of obstruction, inflammatory process or
abnormal fluid collections. Normal appendix visualized.

Vascular/Lymphatic: No pathologically enlarged lymph nodes. No
abdominal aortic aneurysm.

Reproductive:  No mass or other significant abnormality.

Other:  None.

Musculoskeletal:  No suspicious bone lesions identified.
IMPRESSION: Negative.  No acute findings or other significant abnormality.

## 2020-07-17 ENCOUNTER — Telehealth: Payer: Self-pay

## 2020-07-17 DIAGNOSIS — Z006 Encounter for examination for normal comparison and control in clinical research program: Secondary | ICD-10-CM

## 2020-07-17 NOTE — Telephone Encounter (Signed)
Patient called back for her 90-day Identify Study follow up phone call. Patient is doing well with no cardiac symptoms at this time. I reminded patient I would call her in March for her 1 year follow-up.

## 2020-07-17 NOTE — Telephone Encounter (Signed)
I have attempted without success to contact this patient by phone for her Identify 90 day follow up phone call. I left a message for patient to return my phone call with my name and callback number. An e-mail was also sent to patient.

## 2020-07-30 ENCOUNTER — Encounter: Payer: Self-pay | Admitting: Family

## 2020-07-30 ENCOUNTER — Other Ambulatory Visit: Payer: Self-pay

## 2020-07-30 ENCOUNTER — Ambulatory Visit: Payer: BC Managed Care – PPO | Admitting: Family

## 2020-07-30 VITALS — BP 102/62 | HR 85 | Temp 98.7°F | Ht 64.0 in | Wt 173.0 lb

## 2020-07-30 DIAGNOSIS — W57XXXS Bitten or stung by nonvenomous insect and other nonvenomous arthropods, sequela: Secondary | ICD-10-CM | POA: Diagnosis not present

## 2020-07-30 DIAGNOSIS — M791 Myalgia, unspecified site: Secondary | ICD-10-CM | POA: Diagnosis not present

## 2020-07-30 DIAGNOSIS — R5383 Other fatigue: Secondary | ICD-10-CM

## 2020-07-30 NOTE — Progress Notes (Signed)
Phyllis Morgan is a 60 y.o. female with the following history as recorded in EpicCare:  Patient Active Problem List   Diagnosis Date Noted   Solitary pulmonary nodule 06/04/2020   PVC (premature ventricular contraction) 01/12/2020   Chest pain of uncertain etiology 70/35/0093   Family history of abdominal aortic aneurysm 01/12/2020   Chronic bronchitis, unspecified chronic bronchitis type ([place]) 01/03/2020   Esophageal reflux 07/03/2015   Celiac sprue 07/03/2015   Seasonal allergies    Celiac disease 02/03/2011    Current Outpatient Medications  Medication Sig Dispense Refill   albuterol (VENTOLIN HFA) 108 (90 Base) MCG/ACT inhaler Inhale 2 puffs into the lungs every 6 (six) hours as needed for wheezing or shortness of breath. 18 g 0   Cholecalciferol (VITAMIN D PO) Take by mouth.     Cholecalciferol 50 MCG (2000 UT) TABS Take 1 tablet by mouth daily.     dexlansoprazole (DEXILANT) 60 MG capsule TAKE 1 CAPSULE BY MOUTH EVERY MORNING 90 capsule 4   estradiol (VIVELLE-DOT) 0.1 MG/24HR patch Place one patch onto the skin twice weekly. Pls fill with Novan generic. 24 patch 4   fluticasone (FLONASE) 50 MCG/ACT nasal spray Place 2 sprays into the nose daily.     GuaiFENesin (MUCINEX PO) Take by mouth.     Multiple Vitamin (MULTI-VITAMIN PO) Take by mouth.     progesterone (PROMETRIUM) 100 MG capsule Take 1 capsule (100 mg total) by mouth at bedtime. 90 capsule 3   pseudoephedrine (SUDAFED) 60 MG tablet Take 60 mg by mouth every 4 (four) hours as needed.     ivabradine (CORLANOR) 5 MG TABS tablet Take 1 tablet (5 mg total) by mouth 2 (two) times daily with a meal. (Patient not taking: Reported on 07/30/2020) 2 tablet 0   loratadine (CLARITIN) 10 MG tablet Take 10 mg by mouth daily. (Patient not taking: Reported on 07/30/2020)     metoprolol tartrate (LOPRESSOR) 50 MG tablet Take one tablet 50 mg by mouth 2 hours prior to your Cardiac CT. (Patient not taking: Reported on 07/30/2020) 1 tablet 0    No current facility-administered medications for this visit.    Allergies: Avelox [moxifloxacin hcl in nacl] and Sulfa antibiotics  Past Medical History:  Diagnosis Date   Biliary dyskinesia    Celiac disease    Celiac disease 2013   Colon polyps    Gastric polyps    GERD (gastroesophageal reflux disease)    IDA (iron deficiency anemia)    Osteopenia 11/2018   T score -1.3 FRAX 6.5% / 0.4% stable from prior DEXA   Seasonal allergies     Past Surgical History:  Procedure Laterality Date   ABDOMINAL SURGERY  1993   Rt Ovarian Cystectomy   CESAREAN SECTION     CHOLECYSTECTOMY  2007   ENDOMETRIAL ABLATION  12/21/06   HER OPTION CRYOABLATION    HAND SURGERY     THUMB/RIGHT HAND   KNEE SURGERY     Left eye retinal tear  12/18/2018   [facility]   PELVIC LAPAROSCOPY     OVARIAN CYSTECTOMY   TUBAL LIGATION  12/2002   FALLOPE RINGS   UMBILICAL HERNIA REPAIR      Family History  Problem Relation Age of Onset   Hypertension Mother    Heart disease Mother    Cancer Mother        SKIN CANCER   Hypertension Father    Cancer Father        COLON  Heart disease Maternal Grandmother    Cancer Paternal Grandmother        Female cancer  ?type   Colon cancer Neg Hx    Esophageal cancer Neg Hx    Rectal cancer Neg Hx    Stomach cancer Neg Hx     Social History   Tobacco Use   Smoking status: Never   Smokeless tobacco: Never  Substance Use Topics   Alcohol use: Yes    Alcohol/week: 0.0 standard drinks    Comment: Rare    Subjective:   Patient is concerned about increased "inflammation" in her joints for the past 2-3 weeks;  Saw her orthopedist about trigger finger approximately 2 weeks ago- symptoms just developed suddenly; saw her chiropractor for upper back approximately 3 weeks ago; Did remember that she had a tick bite at the end of May; concerned that tick bite may be the source of these symptoms. No new medications or supplements;    Objective:   Vitals:   07/30/20 1450  BP: 102/62  Pulse: 85  Temp: 98.7 F (37.1 C)  TempSrc: Oral  SpO2: 99%  Weight: 173 lb (78.5 kg)  Height: 5' 4"  (1.626 m)    General: Well developed, well nourished, in no acute distress  Skin : Warm and dry.  Head: Normocephalic and atraumatic  Eyes: Sclera and conjunctiva clear; pupils round and reactive to light; extraocular movements intact  Ears: External normal; canals clear; tympanic membranes normal  Oropharynx: Pink, supple. No suspicious lesions  Neck: Supple without thyromegaly, adenopathy  Lungs: Respirations unlabored;  Musculoskeletal: No deformities; no active joint inflammation  Extremities: No edema, cyanosis, clubbing  Vessels: Symmetric bilaterally  Neurologic: Alert and oriented; speech intact; face symmetrical; moves all extremities well; CNII-XII intact without focal deficit   Assessment:  1. Myalgia   2. Other fatigue   3. Tick bite, unspecified site, sequela     Plan:   ? Reaction to tick bite; will update labs today to evaluate for autoimmune source of symptoms; if no abnormalities noted on labs, will plan to give trial of Doxycycline; follow up to be determined;  This visit occurred during the SARS-CoV-2 public health emergency.  Safety protocols were in place, including screening questions prior to the visit, additional usage of staff PPE, and extensive cleaning of exam room while observing appropriate contact time as indicated for disinfecting solutions.    No follow-ups on file.  Orders Placed This Encounter  Procedures   Antinuclear Antib (ANA)   Sedimentation rate   Rheumatoid Factor   CBC with Differential/Platelet   Comp Met (CMET)   TSH   B. burgdorfi antibodies   B12    Requested Prescriptions    No prescriptions requested or ordered in this encounter

## 2020-07-31 LAB — SEDIMENTATION RATE: Sed Rate: 6 mm/hr (ref 0–30)

## 2020-07-31 LAB — COMPREHENSIVE METABOLIC PANEL
ALT: 15 U/L (ref 0–35)
AST: 23 U/L (ref 0–37)
Albumin: 4 g/dL (ref 3.5–5.2)
Alkaline Phosphatase: 63 U/L (ref 39–117)
BUN: 20 mg/dL (ref 6–23)
CO2: 26 mEq/L (ref 19–32)
Calcium: 8.8 mg/dL (ref 8.4–10.5)
Chloride: 105 mEq/L (ref 96–112)
Creatinine, Ser: 1.08 mg/dL (ref 0.40–1.20)
GFR: 56.76 mL/min — ABNORMAL LOW (ref >60.00)
Glucose, Bld: 91 mg/dL (ref 70–99)
Potassium: 4 mEq/L (ref 3.5–5.1)
Sodium: 139 mEq/L (ref 135–145)
Total Bilirubin: 0.5 mg/dL (ref 0.2–1.2)
Total Protein: 6.5 g/dL (ref 6.0–8.3)

## 2020-07-31 LAB — URIC ACID: Uric Acid, Serum: 4.8 mg/dL (ref 2.4–7.0)

## 2020-07-31 LAB — CBC WITH DIFFERENTIAL/PLATELET
Basophils Absolute: 0.1 10*3/uL (ref 0.0–0.1)
Basophils Relative: 1.2 % (ref 0.0–3.0)
Eosinophils Absolute: 0.2 10*3/uL (ref 0.0–0.7)
Eosinophils Relative: 2.9 % (ref 0.0–5.0)
HCT: 37.2 % (ref 36.0–46.0)
Hemoglobin: 12.7 g/dL (ref 12.0–15.0)
Lymphocytes Relative: 14.4 % (ref 12.0–46.0)
Lymphs Abs: 0.8 10*3/uL (ref 0.7–4.0)
MCHC: 34.2 g/dL (ref 30.0–36.0)
MCV: 90.3 fl (ref 78.0–100.0)
Monocytes Absolute: 0.4 10*3/uL (ref 0.1–1.0)
Monocytes Relative: 8.5 % (ref 3.0–12.0)
Neutro Abs: 3.8 10*3/uL (ref 1.4–7.7)
Neutrophils Relative %: 73 % (ref 43.0–77.0)
Platelets: 200 10*3/uL (ref 150.0–400.0)
RBC: 4.12 Mil/uL (ref 3.87–5.11)
RDW: 13.3 % (ref 11.5–15.5)
WBC: 5.3 10*3/uL (ref 4.0–10.5)

## 2020-07-31 LAB — VITAMIN B12: Vitamin B-12: 423 pg/mL (ref 211–911)

## 2020-07-31 LAB — TSH: TSH: 1.69 u[IU]/mL (ref 0.35–4.50)

## 2020-08-02 ENCOUNTER — Other Ambulatory Visit: Payer: Self-pay | Admitting: Family

## 2020-08-02 ENCOUNTER — Encounter: Payer: Self-pay | Admitting: Family

## 2020-08-02 LAB — ANTI-NUCLEAR AB-TITER (ANA TITER): ANA Titer 1: 1:40 {titer} — ABNORMAL HIGH

## 2020-08-02 LAB — RHEUMATOID FACTOR: Rheumatoid fact SerPl-aCnc: 14 IU/mL — ABNORMAL HIGH (ref <14)

## 2020-08-02 LAB — ANA: Anti Nuclear Antibody (ANA): POSITIVE — AB

## 2020-08-02 LAB — B. BURGDORFI ANTIBODIES: B burgdorferi Ab IgG+IgM: <0.9 index

## 2020-08-02 MED ORDER — DOXYCYCLINE HYCLATE 100 MG PO TABS: 100.0000 mg | ORAL_TABLET | Freq: Two times a day (BID) | ORAL | 0 refills | Status: DC

## 2020-08-09 NOTE — Progress Notes (Signed)
60 y.o. G57P1021 Married Caucasian female here for annual exam.    Having recurrent cyclic break through bleeding with her HRT recently.  She was experiencing this once a year in the past. HRT controls hot flashes and she wants to continue it.  PCP:   Jodi Mourning, FNP  Patient's last menstrual period was 10/10/2011.           Sexually active: Yes.    The current method of family planning is none.    Exercising: Yes.    Home exercise.  Smoker:  no  Health Maintenance: Pap:  08-11-18 normal neg HPV History of abnormal Pap:  no MMG:  01-19-20 normal Colonoscopy:  02-14-16 sessile polyps BMD:   11-10-18  Result  Low bone mass TDaP:  2020 Gardasil:   no HIV:Neg Hep C:Neg Screening Labs:  Hb today: PCP, Urine today: PCP   reports that she has never smoked. She has never used smokeless tobacco. She reports current alcohol use. She reports that she does not use drugs.  Past Medical History:  Diagnosis Date   Biliary dyskinesia    Celiac disease    Celiac disease 2013   Colon polyps    Gastric polyps    GERD (gastroesophageal reflux disease)    IDA (iron deficiency anemia)    Osteopenia 11/2018   T score -1.3 FRAX 6.5% / 0.4% stable from prior DEXA   Seasonal allergies     Past Surgical History:  Procedure Laterality Date   ABDOMINAL SURGERY  1993   Rt Ovarian Cystectomy   CESAREAN SECTION     CHOLECYSTECTOMY  2007   ENDOMETRIAL ABLATION  12/21/06   HER OPTION CRYOABLATION    HAND SURGERY     THUMB/RIGHT HAND   KNEE SURGERY     Left eye retinal tear  12/18/2018   [facility]   PELVIC LAPAROSCOPY     OVARIAN CYSTECTOMY   TUBAL LIGATION  12/2002   FALLOPE RINGS   UMBILICAL HERNIA REPAIR      Current Outpatient Medications  Medication Sig Dispense Refill   albuterol (VENTOLIN HFA) 108 (90 Base) MCG/ACT inhaler Inhale 2 puffs into the lungs every 6 (six) hours as needed for wheezing or shortness of breath. 18 g 0   cetirizine (ZYRTEC) 10 MG chewable  tablet Chew 10 mg by mouth daily.     Cholecalciferol (VITAMIN D PO) Take by mouth.     dexlansoprazole (DEXILANT) 60 MG capsule TAKE 1 CAPSULE BY MOUTH EVERY MORNING 90 capsule 4   doxycycline (VIBRA-TABS) 100 MG tablet Take 1 tablet (100 mg total) by mouth 2 (two) times daily. 20 tablet 0   estradiol (VIVELLE-DOT) 0.1 MG/24HR patch Place one patch onto the skin twice weekly. Pls fill with Novan generic. 24 patch 4   fluticasone (FLONASE) 50 MCG/ACT nasal spray Place 2 sprays into the nose daily.     GuaiFENesin (MUCINEX PO) Take by mouth.     Multiple Vitamin (MULTI-VITAMIN PO) Take by mouth.     progesterone (PROMETRIUM) 100 MG capsule Take 1 capsule (100 mg total) by mouth at bedtime. 90 capsule 3   pseudoephedrine (SUDAFED) 60 MG tablet Take 60 mg by mouth every 4 (four) hours as needed.     No current facility-administered medications for this visit.    Family History  Problem Relation Age of Onset   Hypertension Mother    Heart disease Mother    Cancer Mother        SKIN CANCER  Hypertension Father    Cancer Father        COLON   Heart disease Maternal Grandmother    Cancer Paternal Grandmother        Female cancer  ?type   Colon cancer Neg Hx    Esophageal cancer Neg Hx    Rectal cancer Neg Hx    Stomach cancer Neg Hx     Review of Systems  All other systems reviewed and are negative.  Exam:   BP 112/76 (BP Location: Left Arm, Patient Position: Sitting, Cuff Size: Normal)   Pulse 83   Ht 5' 4"  (1.626 m)   Wt 173 lb (78.5 kg)   LMP 10/10/2011   SpO2 97%   BMI 29.70 kg/m     General appearance: alert, cooperative and appears stated age Head: normocephalic, without obvious abnormality, atraumatic Neck: no adenopathy, supple, symmetrical, trachea midline and thyroid normal to inspection and palpation Lungs: clear to auscultation bilaterally Breasts: normal appearance, no masses or tenderness, No nipple retraction or dimpling, No nipple discharge or bleeding, No  axillary adenopathy Heart: regular rate and rhythm Abdomen: soft, non-tender; no masses, no organomegaly Extremities: extremities normal, atraumatic, no cyanosis or edema Skin: skin color, texture, turgor normal. No rashes or lesions Lymph nodes: cervical, supraclavicular, and axillary nodes normal. Neurologic: grossly normal  Pelvic: External genitalia:  no lesions              No abnormal inguinal nodes palpated.              Urethra:  normal appearing urethra with no masses, tenderness or lesions              Bartholins and Skenes: normal                 Vagina: normal appearing vagina with normal color and discharge, no lesions              Cervix: no lesions              Pap taken: yes. Bimanual Exam:  Uterus:  normal size, contour, position, consistency, mobility, non-tender              Adnexa: no mass, fullness, tenderness              Rectal exam: yes.  Confirms.              Anus:  normal sphincter tone, no lesions  Chaperone was present for exam.  Assessment:   Well woman visit with normal exam. Status post endometrial ablation. Cryoablation. Status post right ovarian cystectomy.  Status post BTL.  Osteopenia.  HRT.  Postmenopausal bleeding.  Low vit D, but normal for several years.   Plan: Mammogram screening discussed. Self breast awareness reviewed. Pap and HR HPV as above. Guidelines for Calcium, Vitamin D, regular exercise program including cardiovascular and weight bearing exercise. BMD [facility] fall at [facility]. Return for pelvic US and possible EMB.  HRT refilled.   Discused WHI and use of HRT which can increase risk of PE, DVT, MI, stroke and breast cancer.  Follow up annually and prn.     After visit summary provided.

## 2020-08-11 ENCOUNTER — Encounter: Payer: Self-pay | Admitting: Family

## 2020-08-12 NOTE — Telephone Encounter (Signed)
Patient called to follow up on this Mychart message sent to PCP.  Patient was aware PCP is out of office today and will be back in tomorrow.

## 2020-08-13 ENCOUNTER — Other Ambulatory Visit: Payer: Self-pay | Admitting: Family

## 2020-08-13 ENCOUNTER — Encounter: Payer: Self-pay | Admitting: Family

## 2020-08-13 DIAGNOSIS — R768 Other specified abnormal immunological findings in serum: Secondary | ICD-10-CM

## 2020-08-13 DIAGNOSIS — M791 Myalgia, unspecified site: Secondary | ICD-10-CM

## 2020-08-13 MED ORDER — DOXYCYCLINE HYCLATE 100 MG PO TABS: 100.0000 mg | ORAL_TABLET | Freq: Two times a day (BID) | ORAL | 0 refills | Status: DC

## 2020-08-15 ENCOUNTER — Other Ambulatory Visit: Payer: Self-pay

## 2020-08-15 ENCOUNTER — Encounter: Payer: Self-pay | Admitting: Obstetrics and Gynecology

## 2020-08-15 ENCOUNTER — Other Ambulatory Visit (HOSPITAL_COMMUNITY)
Admission: RE | Admit: 2020-08-15 | Discharge: 2020-08-15 | Disposition: A | Discharge status: Home / Self Care | Payer: BC Managed Care – PPO | Source: Ambulatory Visit | Attending: Obstetrics and Gynecology | Admitting: Obstetrics and Gynecology

## 2020-08-15 ENCOUNTER — Ambulatory Visit (INDEPENDENT_AMBULATORY_CARE_PROVIDER_SITE_OTHER): Payer: BC Managed Care – PPO | Admitting: Obstetrics and Gynecology

## 2020-08-15 VITALS — BP 112/76 | HR 83 | Ht 64.0 in | Wt 173.0 lb

## 2020-08-15 DIAGNOSIS — Z01419 Encounter for gynecological examination (general) (routine) without abnormal findings: Secondary | ICD-10-CM | POA: Diagnosis not present

## 2020-08-15 DIAGNOSIS — Z79890 Hormone replacement therapy (postmenopausal): Secondary | ICD-10-CM | POA: Diagnosis not present

## 2020-08-15 DIAGNOSIS — N950 Postmenopausal bleeding: Secondary | ICD-10-CM

## 2020-08-15 DIAGNOSIS — Z124 Encounter for screening for malignant neoplasm of cervix: Secondary | ICD-10-CM

## 2020-08-15 MED ORDER — ESTRADIOL 0.1 MG/24HR TD PTTW: MEDICATED_PATCH | TRANSDERMAL | 3 refills | Status: DC

## 2020-08-15 MED ORDER — PROGESTERONE MICRONIZED 100 MG PO CAPS: 100.0000 mg | ORAL_CAPSULE | Freq: Every day | ORAL | 3 refills | Status: DC

## 2020-08-15 NOTE — Patient Instructions (Signed)

## 2020-08-19 ENCOUNTER — Encounter: Payer: Self-pay | Admitting: Family Medicine

## 2020-08-19 ENCOUNTER — Ambulatory Visit: Payer: Self-pay

## 2020-08-19 ENCOUNTER — Other Ambulatory Visit: Payer: Self-pay

## 2020-08-19 ENCOUNTER — Ambulatory Visit (INDEPENDENT_AMBULATORY_CARE_PROVIDER_SITE_OTHER): Payer: BC Managed Care – PPO | Admitting: Family Medicine

## 2020-08-19 ENCOUNTER — Ambulatory Visit (INDEPENDENT_AMBULATORY_CARE_PROVIDER_SITE_OTHER): Payer: BC Managed Care – PPO

## 2020-08-19 VITALS — BP 108/82 | HR 82 | Ht 64.0 in | Wt 174.0 lb

## 2020-08-19 DIAGNOSIS — M25562 Pain in left knee: Secondary | ICD-10-CM | POA: Diagnosis not present

## 2020-08-19 NOTE — Patient Instructions (Signed)
Thank you for coming in today.   Please get an Xray today before you leave   Call or go to the ER if you develop a large red swollen joint with extreme pain or oozing puss.    Please use Voltaren gel (Generic Diclofenac Gel) up to 4x daily for pain as needed.  This is available over-the-counter as both the name brand Voltaren gel and the generic diclofenac gel.   Recheck if not improving.

## 2020-08-19 NOTE — Progress Notes (Signed)
I, Peterson Lombard, LAT, ATC acting as a scribe for Lynne Leader, MD.  Subjective:    I'm seeing this patient as a consultation for: Phyllis Morgan, [place]. Note will be routed back to referring provider/PCP.  CC: Bilateral knee pain  HPI: Pt is a 60 y/o female c/o bilat knee pain for the past 3-4 weeks, L>R. History of R meniscal surgery 3 years ago. Patient is currently being treated for tick bite that occurred behind L knee. Patient is on doxycycline. Pt states that knee is not painful but rather swollen and stiff. If there is pain it is beneath patella. Patient likes to be very active. Is leaving for [place] in 2 weeks.   Knee swelling: Radiates:  Mechanical symptoms: Aggravates: Treatments tried:  Dx testing: 07/30/20 Labs (ANA-titer, uric acid, B12, B burgdorferi Ab, TSH, CMET, CBC, rheumatoid factor, sed rate, & ANA)   Past medical history, Surgical history, Family history, Social history, Allergies, and medications have been entered into the medical record, reviewed.   Review of Systems: No new headache, visual changes, nausea, vomiting, diarrhea, constipation, dizziness, abdominal pain, skin rash, fevers, chills, night sweats, weight loss, swollen lymph nodes, body aches, joint swelling, muscle aches, chest pain, shortness of breath, mood changes, visual or auditory hallucinations.   Objective:    Vitals:   08/19/20 1408  BP: 108/82  Pulse: 82  SpO2: 97%   General: Well Developed, well nourished, and in no acute distress.  Neuro/Psych: Alert and oriented x3, extra-ocular muscles intact, able to move all 4 extremities, sensation grossly intact. Skin: Warm and dry, no rashes noted.  Respiratory: Not using accessory muscles, speaking in full sentences, trachea midline.  Cardiovascular: Pulses palpable, no extremity edema. Abdomen: Does not appear distended. MSK: Left knee mild effusion.  Normal motion.  Nontender.  Stable ligamentous exam.  Intact strength. No significant skin  erythema present.  Lab and Radiology Results  Procedure: Real-time Ultrasound Guided Injection of left knee superior lateral patellar space Device: Philips Affiniti 50G Images permanently stored and available for review in PACS Ultrasound evaluation prior to injection reveals moderate effusion.  Intact quad and patellar tendon.  Narrowed medial joint line.  Narrowed lateral joint line.  No Baker's cyst. Verbal informed consent obtained.  Discussed risks and benefits of procedure. Warned about infection bleeding damage to structures skin hypopigmentation and fat atrophy among others. Patient expresses understanding and agreement Time-out conducted.   Noted no overlying erythema, induration, or other signs of local infection.   Skin prepped in a sterile fashion.   Local anesthesia: Topical Ethyl chloride.   With sterile technique and under real time ultrasound guidance:  61m kenalog and 2 ml marcaine injected into knee joint. Fluid seen entering the joint capsule.   Completed without difficulty   Pain immediately resolved suggesting accurate placement of the medication.   Advised to call if fevers/chills, erythema, induration, drainage, or persistent bleeding.   Images permanently stored and available for review in the ultrasound unit.  Impression: Technically successful ultrasound guided injection.   X-ray images left knee obtained today personally and independently interpreted. Mild medial compartment DJD.  Minimal patellofemoral DJD.  No acute fractures. Await formal radiology review   Impression and Recommendations:    Assessment and Plan: 60y.o. female with left knee pain and effusion.  Unclear etiology thought to be probably related to exacerbation of DJD or mild medial meniscus degenerative tear.  Doubtful related to tick bite.  Plan for steroid injection today.  Additionally recommend Voltaren  gel.  Unfortunately aspiration evaluation is unlikely to be very helpful because she  is already on antibiotics.  Recheck back if not improving.  PDMP not reviewed this encounter. Orders Placed This Encounter  Procedures   [place] LIMITED JOINT SPACE STRUCTURES LOW LEFT(NO LINKED CHARGES)    Order Specific Question:   Reason for Exam (SYMPTOM  OR DIAGNOSIS REQUIRED)    Answer:   lt knee    Order Specific Question:   Preferred imaging location?    Answer:   [place]   DG Knee AP/LAT W/[place] Left    Standing Status:   Future    Number of Occurrences:   1    Standing Expiration Date:   08/19/2021    Order Specific Question:   Reason for Exam (SYMPTOM  OR DIAGNOSIS REQUIRED)    Answer:   eval knee pain left    Order Specific Question:   Is patient pregnant?    Answer:   No    Order Specific Question:   Preferred imaging location?    Answer:   [facility]   No orders of the defined types were placed in this encounter.   Discussed warning signs or symptoms. Please see discharge instructions. Patient expresses understanding.   The above documentation has been reviewed and is accurate and complete Lynne Leader, M.D.

## 2020-08-20 ENCOUNTER — Ambulatory Visit: Payer: BC Managed Care – PPO | Admitting: Family Medicine

## 2020-08-20 LAB — CYTOLOGY - PAP
Comment: NEGATIVE
Diagnosis: NEGATIVE
High risk HPV: NEGATIVE

## 2020-08-20 NOTE — Progress Notes (Signed)
Left knee x-ray shows some fluid in the knee joint but no fracture.  No significant arthritis present.

## 2020-08-26 ENCOUNTER — Encounter: Payer: Self-pay | Admitting: Family

## 2020-08-28 ENCOUNTER — Ambulatory Visit: Payer: BC Managed Care – PPO | Admitting: Internal Medicine

## 2020-08-28 ENCOUNTER — Telehealth: Payer: Self-pay

## 2020-08-28 ENCOUNTER — Other Ambulatory Visit: Payer: Self-pay

## 2020-08-28 ENCOUNTER — Encounter: Payer: Self-pay | Admitting: Internal Medicine

## 2020-08-28 ENCOUNTER — Ambulatory Visit: Payer: BC Managed Care – PPO | Admitting: Medical

## 2020-08-28 VITALS — BP 122/80 | HR 83 | Temp 98.3°F | Resp 16 | Ht 64.0 in | Wt 172.4 lb

## 2020-08-28 DIAGNOSIS — W57XXXS Bitten or stung by nonvenomous insect and other nonvenomous arthropods, sequela: Secondary | ICD-10-CM | POA: Diagnosis not present

## 2020-08-28 DIAGNOSIS — M2550 Pain in unspecified joint: Secondary | ICD-10-CM | POA: Diagnosis not present

## 2020-08-28 NOTE — Telephone Encounter (Signed)
Patient saw Dr. Quincy Simmonds 08/15/20 "Having recurrent cyclic break through bleeding with her HRT recently.  She was experiencing this once a year in the past. Return for pelvic US and possible EMB."  Patient said the spotting has persisted and the last few days with increased spotting.  She said she feels like she might be going to have a regular period.  She is going out of the country x 2 weeks next Tuesday.  She is scheduled for u/s early September.

## 2020-08-28 NOTE — Patient Instructions (Signed)
For aches and pains:  Tylenol  500 mg OTC 2 tabs a day every 8 hours as needed for pain  IBUPROFEN (Advil or Motrin) 200 mg 2 tablets every 6 hours as needed for pain.  Always take it with food because may cause gastritis and ulcers.  If you notice nausea, stomach pain, change in the color of stools --->  Stop the medicine and let us know  Get your blood work today  Please call rheumatology Dr. Estanislado Pandy

## 2020-08-28 NOTE — Telephone Encounter (Signed)
Patient informed of recommendations. She is picking up her PRometrium today and will have plenty if she needs to double up.

## 2020-08-28 NOTE — Progress Notes (Signed)
Subjective:    Patient ID: Phyllis Morgan, female    DOB: 01/23/1961, 60 y.o.   MRN: [id number]  DOS:  08/28/2020 Type of visit - description: Acute  Patient had a tick bite Jun 23, 2019, subsequently was seen by PCP due to unusual aches, pains. Extensive blood work done, ANA was a slightly positive, rheumatoid factor borderline, Lyme serology negative, other labs okay. She was treated empirically with doxycycline. Since then continue with on and off unusual aches like  >>> pain @ the base of the thumb, knee pain with left knee effusion.  She is concerned about an underlying condition.   Review of Systems No fever chills No rash No nausea vomiting Occasional headache She remains active. Denies any eye irritations consistent with iritis.     Past Medical History:  Diagnosis Date   Biliary dyskinesia    Celiac disease    Celiac disease 2013   Colon polyps    Gastric polyps    GERD (gastroesophageal reflux disease)    IDA (iron deficiency anemia)    Osteopenia 11/2018   T score -1.3 FRAX 6.5% / 0.4% stable from prior DEXA   Seasonal allergies     Past Surgical History:  Procedure Laterality Date   ABDOMINAL SURGERY  1993   Rt Ovarian Cystectomy   CESAREAN SECTION     CHOLECYSTECTOMY  2007   ENDOMETRIAL ABLATION  12/21/06   HER OPTION CRYOABLATION    HAND SURGERY     THUMB/RIGHT HAND   KNEE SURGERY     Left eye retinal tear  12/18/2018   [facility]   PELVIC LAPAROSCOPY     OVARIAN CYSTECTOMY   TUBAL LIGATION  12/2002   FALLOPE RINGS   UMBILICAL HERNIA REPAIR      Allergies as of 08/28/2020       Reactions   Avelox [moxifloxacin Hcl In Nacl] Nausea Only   Sulfa Antibiotics Other (See Comments)   Blurred vision        Medication List        Accurate as of August 28, 2020 11:59 PM. If you have any questions, ask your nurse or doctor.          albuterol 108 (90 Base) MCG/ACT inhaler Commonly known as: VENTOLIN HFA Inhale 2 puffs  into the lungs every 6 (six) hours as needed for wheezing or shortness of breath.   cetirizine 10 MG chewable tablet Commonly known as: ZYRTEC Chew 10 mg by mouth daily.   dexlansoprazole 60 MG capsule Commonly known as: Dexilant TAKE 1 CAPSULE BY MOUTH EVERY MORNING   doxycycline 100 MG tablet Commonly known as: VIBRA-TABS Take 1 tablet (100 mg total) by mouth 2 (two) times daily.   estradiol 0.1 MG/24HR patch Commonly known as: VIVELLE-DOT Place one patch onto the skin twice weekly. Pls fill with Novan generic.   fluticasone 50 MCG/ACT nasal spray Commonly known as: FLONASE Place 2 sprays into the nose daily.   MUCINEX PO Take by mouth.   MULTI-VITAMIN PO Take by mouth.   progesterone 100 MG capsule Commonly known as: Prometrium Take 1 capsule (100 mg total) by mouth at bedtime.   pseudoephedrine 60 MG tablet Commonly known as: SUDAFED Take 60 mg by mouth every 4 (four) hours as needed.   VITAMIN D PO Take by mouth.           Objective:   Physical Exam BP 122/80 (BP Location: Left Arm, Patient Position: Sitting, Cuff Size: Small)  Pulse 83   Temp 98.3 F (36.8 C) (Oral)   Resp 16   Ht 5' 4"  (1.626 m)   Wt 172 lb 6 oz (78.2 kg)   LMP 10/10/2011   SpO2 97%   BMI 29.59 kg/m  General:   Well developed, NAD, BMI noted. HEENT:  Normocephalic . Face symmetric, atraumatic Neck: No thyromegaly Lungs:  CTA B Normal respiratory effort, no intercostal retractions, no accessory muscle use. Heart: RRR,  no murmur.  Lower extremities: Minimal puffiness at the ankles.  No pretibial pitting edema. Upper extremities: Hands and wrists with no synovitis.  Nails with no lesions Skin: Not pale. Not jaundice Neurologic:  alert & oriented X3.  Speech normal, gait appropriate for age and unassisted Psych--  Cognition and judgment appear intact.  Cooperative with normal attention span and concentration.  Behavior appropriate. No anxious or depressed appearing.       Assessment     60 year old female, PMH includes celiac disease, GERD, IDA, osteopenia, BTL, presents with  Unusual aches and pains, history of a tick bite, + ANA, borderline positive rheumatoid factor Symptoms as described above, physical exam is actually benign. Because multiple symptoms + ANA, she was referred to rheumatology.  They already called her and I encouraged Alejandro  to set up the appointment. In the meantime I think is reasonable to recheck Lyme serology, to see if it turns positive. Otherwise recommend judicious use of Tylenol and ibuprofen with GI precautions.   This visit occurred during the SARS-CoV-2 public health emergency.  Safety protocols were in place, including screening questions prior to the visit, additional usage of staff PPE, and extensive cleaning of exam room while observing appropriate contact time as indicated for disinfecting solutions.

## 2020-08-28 NOTE — Telephone Encounter (Signed)
I don't think we can get her in for the ultrasound and biopsy prior to her leaving the country on Tuesday. She should take extra prometrium with her. She can double up and take 2 prometrium a day if her bleeding gets heavy.  She can contact [place] through mychart if needed while traveling.

## 2020-08-29 LAB — LYME DISEASE SEROLOGY W/REFLEX: Lyme Total Antibody EIA: NEGATIVE

## 2020-10-02 NOTE — Progress Notes (Signed)
GYNECOLOGY  VISIT   HPI: 60 y.o.   Married  Caucasian  female   [phone] with Patient's last menstrual period was 10/10/2011.   here for pelvic ultrasound and possible EMB.   Patient has break through bleeding with HRT for 6 - 9 months. She is not clear if this is really cyclical or not.   On Vivelle Dot twice weekly and daily Prometrium.   Bled for 4 days at the end of July.  She called in and received recommendation to increase her Prometrium.  She took an extra Prometrium, and this stopped the bleeding.   She would like to continue her HRT.  She has had a cryoablation of her uterus.   GYNECOLOGIC HISTORY: Patient's last menstrual period was 10/10/2011. Contraception:  PMP Menopausal hormone therapy: Vivelle Dot 0.56m, Progesterone 1062mLast mammogram:  01-19-20 Neg/BiRads1 Last pap smear:  08-15-20 Neg:Neg HR HPV, 08-11-18 Neg:Neg HR HPV, 12-05-13 Neg:Neg HR HPV        OB History     Gravida  3   Para  1   Term  1   Preterm      AB  2   Living  1      SAB  2   IAB      Ectopic      Multiple      Live Births                 Patient Active Problem List   Diagnosis Date Noted   Solitary pulmonary nodule 06/04/2020   PVC (premature ventricular contraction) 01/12/2020   Chest pain of uncertain etiology 1201/10/3233 Family history of abdominal aortic aneurysm 01/12/2020   Chronic bronchitis, unspecified chronic bronchitis type ([place]CM[place]12/02/2019   Esophageal reflux 07/03/2015   Celiac sprue 07/03/2015   Seasonal allergies    Celiac disease 02/03/2011    Past Medical History:  Diagnosis Date   Biliary dyskinesia    Celiac disease    Celiac disease 2013   Colon polyps    Gastric polyps    GERD (gastroesophageal reflux disease)    IDA (iron deficiency anemia)    Osteopenia 11/2018   T score -1.3 FRAX 6.5% / 0.4% stable from prior DEXA   Seasonal allergies     Past Surgical History:  Procedure Laterality Date   CESAREAN SECTION      CHOLECYSTECTOMY  2007   ENDOMETRIAL ABLATION  12/21/2006   HER OPTION CRYOABLATION    HAND SURGERY     THUMB/RIGHT HAND   KNEE SURGERY     Left eye retinal tear  12/18/2018   [facility]   OVARIAN CYST REMOVAL Right 1993   Rt Ovarian Cystectomy   PELVIC LAPAROSCOPY     OVARIAN CYSTECTOMY   TUBAL LIGATION  12/2002   FALLOPE RINGS   UMBILICAL HERNIA REPAIR      Current Outpatient Medications  Medication Sig Dispense Refill   albuterol (VENTOLIN HFA) 108 (90 Base) MCG/ACT inhaler Inhale 2 puffs into the lungs every 6 (six) hours as needed for wheezing or shortness of breath. 18 g 0   cetirizine (ZYRTEC) 10 MG chewable tablet Chew 10 mg by mouth daily.     Cholecalciferol (VITAMIN D PO) Take by mouth.     dexlansoprazole (DEXILANT) 60 MG capsule TAKE 1 CAPSULE BY MOUTH EVERY MORNING 90 capsule 4   estradiol (VIVELLE-DOT) 0.1 MG/24HR patch Place one patch onto the skin twice weekly. Pls fill with Novan generic. 24 patch 3  fluticasone (FLONASE) 50 MCG/ACT nasal spray Place 2 sprays into the nose daily.     GuaiFENesin (MUCINEX PO) Take by mouth.     Multiple Vitamin (MULTI-VITAMIN PO) Take by mouth.     progesterone (PROMETRIUM) 100 MG capsule Take 1 capsule (100 mg total) by mouth at bedtime. 90 capsule 3   pseudoephedrine (SUDAFED) 60 MG tablet Take 60 mg by mouth every 4 (four) hours as needed.     No current facility-administered medications for this visit.     ALLERGIES: Avelox [moxifloxacin hcl in nacl] and Sulfa antibiotics  Family History  Problem Relation Age of Onset   Hypertension Mother    Heart disease Mother    Cancer Mother        SKIN CANCER   Hypertension Father    Cancer Father        COLON   Heart disease Maternal Grandmother    Cancer Paternal Grandmother        Female cancer  ?type   Colon cancer Neg Hx    Esophageal cancer Neg Hx    Rectal cancer Neg Hx    Stomach cancer Neg Hx     Social History   Socioeconomic History    Marital status: Married    Spouse name: Not on file   Number of children: 1   Years of education: Not on file   Highest education level: Not on file  Occupational History   Occupation: Freight forwarder  Tobacco Use   Smoking status: Never   Smokeless tobacco: Never  Vaping Use   Vaping Use: Never used  Substance and Sexual Activity   Alcohol use: Yes    Alcohol/week: 0.0 standard drinks    Comment: Rare   Drug use: No   Sexual activity: Yes    Birth control/protection: Surgical    Comment: Tubal lig-1st intercourse 60 yo-Fewer than 5 partners  Other Topics Concern   Not on file  Social History Narrative   Not on file   Social Determinants of Health   Financial Resource Strain: Not on file  Food Insecurity: Not on file  Transportation Needs: Not on file  Physical Activity: Not on file  Stress: Not on file  Social Connections: Not on file  Intimate Partner Violence: Not on file    Review of Systems  All other systems reviewed and are negative.  PHYSICAL EXAMINATION:    BP 110/74   Pulse 67   Ht 5' 4"  (1.626 m)   Wt 173 lb (78.5 kg)   LMP 10/10/2011   SpO2 98%   BMI 29.70 kg/m     General appearance: alert, cooperative and appears stated age  Pelvic US Uterus 5.0 x 4.09 x 2.65 cm.  Small intramural fibroid noted.  EMS 3.1 mm.  Thin and symmetrical.  Small avascular pocket of fluid. Echogenic area in the cervix 14 x 7 x 11 mm with feeder vessel, possible polyp.  Ovaries atrophic and normal.   Residual follicle of the left ovary.  No free fluid.  Pelvic: External genitalia:  no lesions              Urethra:  normal appearing urethra with no masses, tenderness or lesions              Bartholins and Skenes: normal                 Vagina: normal appearing vagina with normal color and discharge, no lesions  Cervix: no lesions                 EMB Consent for procedure. Sterile prep with Hibiclens.  Paracervical block with 10 cc 1% lidocaine, lot [id number],  exp 05/2022.  Tenaculum to anterior cervical lip.  Pipelle passed to 5 cm x 2.   Os finder was also used to dilate cervix.  Tissue to pathology.   No complications.  Minimal EBL.   Chaperone was present for exam:  Estill Bamberg, CMA  ASSESSMENT  HRT.  Postmenopausal bleeding.  Possible cervical polyp.  Status post endometrial ablation.  Cryoablation. Status post right ovarian cystectomy.  Status post BTL.  Osteopenia.   PLAN  We reviewed her ultrasound findings and images.  Ok to continue HRT for now. Discused WHI and use of HRT which can increase risk of PE, DVT, MI, stroke and breast cancer.  Consider increasing Prometrium to 200 mg at hs.  Fu biopsy. FU prn.   An After Visit Summary was printed and given to the patient.  15 min  total time was spent for this patient encounter, including preparation, face-to-face counseling with the patient, coordination of care, and documentation of the encounter.

## 2020-10-03 ENCOUNTER — Ambulatory Visit: Payer: BC Managed Care – PPO | Admitting: Obstetrics and Gynecology

## 2020-10-03 ENCOUNTER — Other Ambulatory Visit: Payer: Self-pay

## 2020-10-03 ENCOUNTER — Other Ambulatory Visit (HOSPITAL_COMMUNITY)
Admission: RE | Admit: 2020-10-03 | Discharge: 2020-10-03 | Disposition: A | Discharge status: Home / Self Care | Payer: BC Managed Care – PPO | Source: Ambulatory Visit | Attending: Obstetrics and Gynecology | Admitting: Obstetrics and Gynecology

## 2020-10-03 ENCOUNTER — Ambulatory Visit (INDEPENDENT_AMBULATORY_CARE_PROVIDER_SITE_OTHER): Payer: BC Managed Care – PPO

## 2020-10-03 ENCOUNTER — Encounter: Payer: Self-pay | Admitting: Obstetrics and Gynecology

## 2020-10-03 VITALS — BP 110/74 | HR 67 | Ht 64.0 in | Wt 173.0 lb

## 2020-10-03 DIAGNOSIS — N950 Postmenopausal bleeding: Secondary | ICD-10-CM | POA: Diagnosis not present

## 2020-10-03 DIAGNOSIS — Z79890 Hormone replacement therapy (postmenopausal): Secondary | ICD-10-CM

## 2020-10-03 DIAGNOSIS — N888 Other specified noninflammatory disorders of cervix uteri: Secondary | ICD-10-CM | POA: Diagnosis not present

## 2020-10-03 NOTE — Patient Instructions (Signed)
Endometrial Biopsy An endometrial biopsy is a procedure to remove tissue samples from the endometrium, which is the lining of the uterus. The tissue that is removed can then be checked under a microscope for disease. This procedure is used to diagnose conditions such as endometrial cancer, endometrial tuberculosis, polyps, or other inflammatory conditions. This procedure may also be used to investigate uterine bleeding to determine where you are in your menstrual cycle or how your hormone levels are affecting the lining of the uterus. Tell a health care provider about: Any allergies you have. All medicines you are taking, including vitamins, herbs, eye drops, creams, and over-the-counter medicines. Any problems you or family members have had with anesthetic medicines. Any blood disorders you have. Any surgeries you have had. Any medical conditions you have. Whether you are pregnant or may be pregnant. What are the risks? Generally, this is a safe procedure. However, problems may occur, including: Bleeding. Pelvic infection. Puncture of the wall of the uterus with the biopsy device (rare). Allergic reactions to medicines. What happens before the procedure? Keep a record of your menstrual cycles as told by your health care provider. You may need to schedule your procedure for a specific time in your cycle. You may want to bring a sanitary pad to wear after the procedure. Plan to have someone take you home from the hospital or clinic. Ask your health care provider about: Changing or stopping your regular medicines. This is especially important if you are taking diabetes medicines, arthritis medicines, or blood thinners. Taking medicines such as aspirin and ibuprofen. These medicines can thin your blood. Do not take these medicines unless your health care provider tells you to take them. Taking over-the-counter medicines, vitamins, herbs, and supplements. What happens during the procedure? You  will lie on an exam table with your feet and legs supported as in a pelvic exam. Your health care provider will insert an instrument (speculum) into your vagina to see your cervix. Your cervix will be cleansed with an antiseptic solution. A medicine (local anesthetic) will be used to numb the cervix. A forceps instrument (tenaculum) will be used to hold your cervix steady for the biopsy. A thin, rod-like instrument (uterine sound) will be inserted through your cervix to determine the length of your uterus and the location where the biopsy sample will be removed. A thin, flexible tube (catheter) will be inserted through your cervix and into the uterus. The catheter will be used to collect the biopsy sample from your endometrial tissue. The catheter and speculum will then be removed, and the tissue sample will be sent to a lab for examination. The procedure may vary among health care providers and hospitals. What can I expect after procedure? You will rest in a recovery area until you are ready to go home. You may have mild cramping and a small amount of vaginal bleeding. This is normal. You may have a small amount of vaginal bleeding for a few days. This is normal. It is up to you to get the results of your procedure. Ask your health care provider, or the department that is doing the procedure, when your results will be ready. Follow these instructions at home: Take over-the-counter and prescription medicines only as told by your health care provider. Do not douche, use tampons, or have sexual intercourse until your health care provider approves. Return to your normal activities as told by your health care provider. Ask your health care provider what activities are safe for you. Follow instructions  from your health care provider about any activity restrictions, such as restrictions on strenuous exercise or heavy lifting. Keep all follow-up visits. This is important. Contact a health care  provider: You have heavy bleeding, or bleed for longer than 2 days after the procedure. You have bad smelling discharge from your vagina. You have a fever or chills. You have a burning sensation when urinating or you have difficulty urinating. You have severe pain in your lower abdomen. Get help right away if you: You have severe cramps in your stomach or back. You pass large blood clots. Your bleeding increases. You become weak or light-headed, or you faint or lose consciousness. Summary An endometrial biopsy is a procedure to remove tissue samples is taken from the endometrium, which is the lining of the uterus. The tissue sample that is removed will be checked under a microscope for disease. This procedure is used to diagnose conditions such as endometrial cancer, endometrial tuberculosis, polyps, or other inflammatory conditions. After the procedure, it is common to have mild cramping and a small amount of vaginal bleeding for a few days. Do not douche, use tampons, or have sexual intercourse until your health care provider approves. Ask your health care provider which activities are safe for you. This information is not intended to replace advice given to you by your health care provider. Make sure you discuss any questions you have with your health care provider. Document Revised: 08/14/2019 Document Reviewed: 08/14/2019 Elsevier Patient Education  [place].

## 2020-10-04 ENCOUNTER — Encounter: Payer: Self-pay | Admitting: Internal Medicine

## 2020-10-04 ENCOUNTER — Ambulatory Visit: Payer: BC Managed Care – PPO | Admitting: Internal Medicine

## 2020-10-04 VITALS — BP 122/68 | HR 77 | Ht 64.0 in | Wt 173.0 lb

## 2020-10-04 DIAGNOSIS — Z8249 Family history of ischemic heart disease and other diseases of the circulatory system: Secondary | ICD-10-CM | POA: Diagnosis not present

## 2020-10-04 DIAGNOSIS — E782 Mixed hyperlipidemia: Secondary | ICD-10-CM | POA: Diagnosis not present

## 2020-10-04 LAB — SURGICAL PATHOLOGY

## 2020-10-04 NOTE — Progress Notes (Signed)
Cardiology Office Note:    Date:  10/04/2020   ID:  Phyllis Morgan, DOB Jun 22, 1960, MRN [id number]  PCP:  Marrian Salvage, [place] Cardiologist:  Werner Lean, MD  [facility] Electrophysiologist:  None   CC: Follow up CT Scan  History of Present Illness:    Phyllis Morgan is a 60 y.o. female with a hx of Celiac Disease, HLD, GERD, and IDA who presents for evaluation 01/12/2020.  In interim of this visit, patient had CT with normal arteries but with pulmonary nodule:  Referred to [facility] clinic.  Has had recent endometrial biopsy.  Has had recent testing for Lyme disease. Seen 10/04/20.  Patient notes that she is doing well.  Since last visit notes normal CT scan.    No chest pain or pressure.  No SOB/DOE and no PND/Orthopnea.  No weight gain, had steroid injection in her left knee and has had subsequent swelling.  No palpitations or syncope.  Notes ankle swelling after her prednisone shots for her knees.   Past Medical History:  Diagnosis Date   Biliary dyskinesia    Celiac disease    Celiac disease 2013   Colon polyps    Gastric polyps    GERD (gastroesophageal reflux disease)    IDA (iron deficiency anemia)    Osteopenia 11/2018   T score -1.3 FRAX 6.5% / 0.4% stable from prior DEXA   Seasonal allergies     Past Surgical History:  Procedure Laterality Date   CESAREAN SECTION     CHOLECYSTECTOMY  2007   ENDOMETRIAL ABLATION  12/21/2006   HER OPTION CRYOABLATION    HAND SURGERY     THUMB/RIGHT HAND   KNEE SURGERY     Left eye retinal tear  12/18/2018   [facility]   OVARIAN CYST REMOVAL Right 1993   Rt Ovarian Cystectomy   PELVIC LAPAROSCOPY     OVARIAN CYSTECTOMY   TUBAL LIGATION  12/2002   FALLOPE RINGS   UMBILICAL HERNIA REPAIR      Current Medications: Current Meds  Medication Sig   albuterol (VENTOLIN HFA) 108 (90 Base) MCG/ACT inhaler Inhale 2 puffs into the lungs every 6 (six) hours as needed for wheezing or  shortness of breath.   cetirizine (ZYRTEC) 10 MG chewable tablet Chew 10 mg by mouth daily.   Cholecalciferol (VITAMIN D PO) Take by mouth.   dexlansoprazole (DEXILANT) 60 MG capsule TAKE 1 CAPSULE BY MOUTH EVERY MORNING   estradiol (VIVELLE-DOT) 0.1 MG/24HR patch Place one patch onto the skin twice weekly. Pls fill with Novan generic.   fluticasone (FLONASE) 50 MCG/ACT nasal spray Place 2 sprays into the nose daily.   GuaiFENesin (MUCINEX PO) Take by mouth.   Multiple Vitamin (MULTI-VITAMIN PO) Take by mouth.   progesterone (PROMETRIUM) 100 MG capsule Take 1 capsule (100 mg total) by mouth at bedtime.   pseudoephedrine (SUDAFED) 60 MG tablet Take 60 mg by mouth every 4 (four) hours as needed.    Allergies:   Avelox [moxifloxacin hcl in nacl] and Sulfa antibiotics   Social History   Socioeconomic History   Marital status: Married    Spouse name: Not on file   Number of children: 1   Years of education: Not on file   Highest education level: Not on file  Occupational History   Occupation: Freight forwarder  Tobacco Use   Smoking status: Never   Smokeless tobacco: Never  Vaping Use   Vaping Use: Never used  Substance  and Sexual Activity   Alcohol use: Yes    Alcohol/week: 0.0 standard drinks    Comment: Rare   Drug use: No   Sexual activity: Yes    Birth control/protection: Surgical    Comment: Tubal lig-1st intercourse 60 yo-Fewer than 5 partners  Other Topics Concern   Not on file  Social History Narrative   Not on file   Social Determinants of Health   Financial Resource Strain: Not on file  Food Insecurity: Not on file  Transportation Needs: Not on file  Physical Activity: Not on file  Stress: Not on file  Social Connections: Not on file     Family History: The patient's family history includes Cancer in her father, mother, and paternal grandmother; Heart disease in her maternal grandmother and mother; Hypertension in her father and mother. There is no history of Colon  cancer, Esophageal cancer, Rectal cancer, or Stomach cancer.  Denies family history of sudden cardiac death including drowning, car accidents, or unexplained deaths in the family. No history hypertrophic cardiomyopathy, left ventricular non-compaction, or arrhythmogenic right ventricular cardiomyopathy; but history of CHF NOS in father No history of bicuspid aortic valve or thoracic aortic aneurysm or dissection; mother died of an abdominal aortic aneurysm. History of coronary artery disease notable for mother (age 75) and grandmother. History of arrhythmia notable for atrial fibrillation in father.  ROS:   Please see the history of present illness.    All other systems reviewed and are negative.  EKGs/Labs/Other Studies Reviewed:    The following studies were reviewed today:  EKG:  EKG is  ordered today.  The ekg ordered today demonstrates  01/14/2020 SR rate 67 WNL  Cardiac CT: Date:04/02/20 Results: IMPRESSION: 1. Coronary artery calcium score 0 Agatston units, suggesting low risk for future cardiac events.   2.  No significant coronary disease noted.  3. 4 mm subpleural nodule in the medial aspect of the right upper lobe, nonspecific, but statistically likely a benign subpleural lymph node. No follow-up needed if patient is low-risk. Non-contrast chest CT can be considered in 12 months if patient is high-risk. This recommendation follows the consensus statement: Guidelines for Management of Incidental Pulmonary Nodules Detected on CT Images: From the Fleischner Society 2017; Radiology 2017; 284:228-243.   Recent Labs: 07/30/2020: ALT 15; BUN 20; Creatinine, Ser 1.08; Hemoglobin 12.7; Platelets 200.0; Potassium 4.0; Sodium 139; TSH 1.69  Recent Lipid Panel    Component Value Date/Time   CHOL 172 08/15/2019 0940   TRIG 120 08/15/2019 0940   HDL 34 (L) 08/15/2019 0940   CHOLHDL 5.1 (H) 08/15/2019 0940   VLDL 21 09/05/2015 0907   LDLCALC 115 (H) 08/15/2019 0940   Risk  Assessment/Calculations:     The 10-year ASCVD risk score Mikey Bussing DC Brooke Bonito., et al., 2013) is: 3.1%   Values used to calculate the score:     Age: 25 years     Sex: Female     Is Non-Hispanic African American: No     Diabetic: No     Tobacco smoker: No     Systolic Blood Pressure: 867 mmHg     Is BP treated: No     HDL Cholesterol: 34 mg/dL     Total Cholesterol: 172 mg/dL   Physical Exam:    VS:  BP 122/68   Pulse 77   Ht 5' 4"  (1.626 m)   Wt 173 lb (78.5 kg)   LMP 10/10/2011   SpO2 99%   BMI 29.70 kg/m  Wt Readings from Last 3 Encounters:  10/04/20 173 lb (78.5 kg)  10/03/20 173 lb (78.5 kg)  08/28/20 172 lb 6 oz (78.2 kg)    GEN:  Well nourished, well developed in no acute distress HEENT: Normal NECK: No JVD LYMPHATICS: No lymphadenopathy CARDIAC: RRR, no murmurs, rubs, gallops RESPIRATORY:  Clear to auscultation without rales, wheezing or rhonchi  ABDOMEN: Soft, non-tender, non-distended MUSCULOSKELETAL:  No edema; No deformity  SKIN: Warm and dry NEUROLOGIC:  Alert and oriented x 3 PSYCHIATRIC:  Normal affect   ASSESSMENT:    1. Mixed hyperlipidemia   2. Family history of MI (myocardial infarction)   3. Family history of abdominal aortic aneurysm     PLAN:     HLD Family history of MI Starting Hormone Replacement Therapy - LDL goal set at < 100 in shared decision making - Fasting lipids next week  Family history of abdominal aortic aneurysm - no evidence on 01/17/2018  - will discuss post age 56 screening  Ankle Swelling - likely iatrogenic from doxy or steroids and has resolved  PVC - monitor presently, no issues   Will plan for one year follow up unless new symptoms or abnormal test results warranting change in plan  Would be reasonable for  APP Follow up     Medication Adjustments/Labs and Tests Ordered: Current medicines are reviewed at length with the patient today.  Concerns regarding medicines are outlined above.  Orders Placed  This Encounter  Procedures   Lipid panel    No orders of the defined types were placed in this encounter.   Patient Instructions  Medication Instructions:  Your physician recommends that you continue on your current medications as directed. Please refer to the Current Medication list given to you today.  *If you need a refill on your cardiac medications before your next appointment, please call your pharmacy*   Lab Work: Lipid panel next week. You will need to be fasting for these labs (nothing to eat or drink after midnight except water and black coffee).  If you have labs (blood work) drawn today and your tests are completely normal, you will receive your results only by: [place] (if you have MyChart) OR A paper copy in the mail If you have any lab test that is abnormal or we need to change your treatment, we will call you to review the results.   Testing/Procedures: None   Follow-Up: At [facility], you and your health needs are our priority.  As part of our continuing mission to provide you with exceptional heart care, we have created designated Provider Care Teams.  These Care Teams include your primary Cardiologist (physician) and Advanced Practice Providers (APPs -  Physician Assistants and Nurse Practitioners) who all work together to provide you with the care you need, when you need it.  We recommend signing up for the patient portal called "MyChart".  Sign up information is provided on this After Visit Summary.  MyChart is used to connect with patients for Virtual Visits (Telemedicine).  Patients are able to view lab/test results, encounter notes, upcoming appointments, etc.  Non-urgent messages can be sent to your provider as well.   To learn more about what you can do with MyChart, go to [email or web address].    Your next appointment:   1 year(s)  The format for your next appointment:   In Person  Provider:   You may see Werner Lean, MD or  one of the following Advanced Practice Providers on  your designated Care Team:   Melina Copa, PA-C Ermalinda Barrios, PA-C   Other Instructions    Signed, Werner Lean, MD  10/04/2020 9:38 AM    [facility]

## 2020-10-04 NOTE — Patient Instructions (Signed)
Medication Instructions:  Your physician recommends that you continue on your current medications as directed. Please refer to the Current Medication list given to you today.  *If you need a refill on your cardiac medications before your next appointment, please call your pharmacy*   Lab Work: Lipid panel next week. You will need to be fasting for these labs (nothing to eat or drink after midnight except water and black coffee).  If you have labs (blood work) drawn today and your tests are completely normal, you will receive your results only by: [place] (if you have MyChart) OR A paper copy in the mail If you have any lab test that is abnormal or we need to change your treatment, we will call you to review the results.   Testing/Procedures: None   Follow-Up: At [facility], you and your health needs are our priority.  As part of our continuing mission to provide you with exceptional heart care, we have created designated Provider Care Teams.  These Care Teams include your primary Cardiologist (physician) and Advanced Practice Providers (APPs -  Physician Assistants and Nurse Practitioners) who all work together to provide you with the care you need, when you need it.  We recommend signing up for the patient portal called "MyChart".  Sign up information is provided on this After Visit Summary.  MyChart is used to connect with patients for Virtual Visits (Telemedicine).  Patients are able to view lab/test results, encounter notes, upcoming appointments, etc.  Non-urgent messages can be sent to your provider as well.   To learn more about what you can do with MyChart, go to [email or web address].    Your next appointment:   1 year(s)  The format for your next appointment:   In Person  Provider:   You may see Werner Lean, MD or one of the following Advanced Practice Providers on your designated Care Team:   Melina Copa, PA-C Ermalinda Barrios, PA-C   Other  Instructions

## 2020-10-08 ENCOUNTER — Other Ambulatory Visit: Payer: Self-pay

## 2020-10-08 ENCOUNTER — Other Ambulatory Visit: Payer: BC Managed Care – PPO | Admitting: *Deleted

## 2020-10-08 DIAGNOSIS — E782 Mixed hyperlipidemia: Secondary | ICD-10-CM

## 2020-10-08 LAB — LIPID PANEL
Chol/HDL Ratio: 7 ratio — ABNORMAL HIGH (ref 0.0–4.4)
Cholesterol, Total: 182 mg/dL (ref 100–199)
HDL: 26 mg/dL — ABNORMAL LOW (ref >39)
LDL Chol Calc (NIH): 113 mg/dL — ABNORMAL HIGH (ref 0–99)
Triglycerides: 244 mg/dL — ABNORMAL HIGH (ref 0–149)
VLDL Cholesterol Cal: 43 mg/dL — ABNORMAL HIGH (ref 5–40)

## 2020-10-09 ENCOUNTER — Other Ambulatory Visit: Payer: BC Managed Care – PPO

## 2020-10-11 ENCOUNTER — Encounter: Payer: Self-pay | Admitting: Obstetrics and Gynecology

## 2020-10-14 ENCOUNTER — Other Ambulatory Visit: Payer: Self-pay

## 2020-10-14 MED ORDER — PROGESTERONE 200 MG PO CAPS: 200.0000 mg | ORAL_CAPSULE | Freq: Every day | ORAL | 3 refills | Status: DC

## 2020-10-17 ENCOUNTER — Telehealth: Payer: Self-pay

## 2020-10-17 DIAGNOSIS — E782 Mixed hyperlipidemia: Secondary | ICD-10-CM

## 2020-10-17 NOTE — Telephone Encounter (Signed)
Called pt reviewed results and MD recommendations. Pt would not like to start medication at this time. She would like to work on improving her diet first.  3 month f/u lab appointment schedule.  All questions were answered.

## 2020-10-17 NOTE — Telephone Encounter (Signed)
-----   Message from Werner Lean, MD sent at 10/11/2020  2:13 PM EDT ----- Results: Elevated cholesterol Plan: Diet and exercise changes; can offer rosuvastatin 5 mg PO Daily.  Werner Lean, MD

## 2020-10-21 ENCOUNTER — Telehealth: Payer: Self-pay | Admitting: Internal Medicine

## 2020-10-21 NOTE — Progress Notes (Deleted)
Office Visit Note  Patient: Phyllis Morgan             Date of Birth: 06-01-1960           MRN: [id number]             PCP: Marrian Salvage, [address] Referring: Marrian Salvage,* Visit Date: 11/04/2020 Occupation: @GUAROCC @  Subjective:  No chief complaint on file.   History of Present Illness: Phyllis Morgan is a 60 y.o. female ***   Activities of Daily Living:  Patient reports morning stiffness for *** {minute/hour:19697}.   Patient {ACTIONS;DENIES/REPORTS:21021675::"Denies"} nocturnal pain.  Difficulty dressing/grooming: {ACTIONS;DENIES/REPORTS:21021675::"Denies"} Difficulty climbing stairs: {ACTIONS;DENIES/REPORTS:21021675::"Denies"} Difficulty getting out of chair: {ACTIONS;DENIES/REPORTS:21021675::"Denies"} Difficulty using hands for taps, buttons, cutlery, and/or writing: {ACTIONS;DENIES/REPORTS:21021675::"Denies"}  No Rheumatology ROS completed.   PMFS History:  Patient Active Problem List   Diagnosis Date Noted   Mixed hyperlipidemia 10/04/2020   Family history of MI (myocardial infarction) 10/04/2020   Solitary pulmonary nodule 06/04/2020   PVC (premature ventricular contraction) 01/12/2020   Family history of abdominal aortic aneurysm 01/12/2020   Chronic bronchitis, unspecified chronic bronchitis type ([place]) 01/03/2020   Esophageal reflux 07/03/2015   Celiac sprue 07/03/2015   Seasonal allergies    Celiac disease 02/03/2011    Past Medical History:  Diagnosis Date   Biliary dyskinesia    Celiac disease    Celiac disease 2013   Colon polyps    Gastric polyps    GERD (gastroesophageal reflux disease)    IDA (iron deficiency anemia)    Osteopenia 11/2018   T score -1.3 FRAX 6.5% / 0.4% stable from prior DEXA   Seasonal allergies     Family History  Problem Relation Age of Onset   Hypertension Mother    Heart disease Mother    Cancer Mother        SKIN CANCER   Hypertension Father    Cancer Father        COLON   Heart disease Maternal  Grandmother    Cancer Paternal Grandmother        Female cancer  ?type   Colon cancer Neg Hx    Esophageal cancer Neg Hx    Rectal cancer Neg Hx    Stomach cancer Neg Hx    Past Surgical History:  Procedure Laterality Date   CESAREAN SECTION     CHOLECYSTECTOMY  2007   ENDOMETRIAL ABLATION  12/21/2006   HER OPTION CRYOABLATION    HAND SURGERY     THUMB/RIGHT HAND   KNEE SURGERY     Left eye retinal tear  12/18/2018   [facility]   OVARIAN CYST REMOVAL Right 1993   Rt Ovarian Cystectomy   PELVIC LAPAROSCOPY     OVARIAN CYSTECTOMY   TUBAL LIGATION  12/2002   [place]     Social History   Social History Narrative   Not on file   Immunization History  Administered Date(s) Administered   Influenza-Unspecified 01/02/2017, 11/17/2019   Moderna Sars-Covid-2 Vaccination 04/05/2019, 05/03/2019, 12/07/2019   PFIZER(Purple Top)SARS-COV-2 Vaccination 07/07/2020   Tdap 07/04/2018     Objective: Vital Signs: LMP 10/10/2011    Physical Exam   Musculoskeletal Exam: ***  CDAI Exam: CDAI Score: -- Patient Global: --; Provider Global: -- Swollen: --; Tender: -- Joint Exam 11/04/2020   No joint exam has been documented for this visit   There is currently no information documented on the homunculus. Go to the Rheumatology activity and  complete the homunculus joint exam.  Investigation: No additional findings.  Imaging: US PELVIS TRANSVAGINAL NON-OB (TV ONLY)  Result Date: 10/06/2020 Pelvic US Uterus 5.0 x 4.09 x 2.65 cm.  Small intramural fibroid noted. EMS 3.1 mm.  Thin and symmetrical.  Small avascular pocket of fluid. Echogenic area in the cervix 14 x 7 x 11 mm with feeder vessel, possible polyp. Ovaries atrophic and normal.   Residual follicle of the left ovary. No free fluid.   Recent Labs: Lab Results  Component Value Date   WBC 5.3 07/30/2020   HGB 12.7 07/30/2020   PLT 200.0 07/30/2020   NA 139 07/30/2020   K 4.0  07/30/2020   CL 105 07/30/2020   CO2 26 07/30/2020   GLUCOSE 91 07/30/2020   BUN 20 07/30/2020   CREATININE 1.08 07/30/2020   BILITOT 0.5 07/30/2020   ALKPHOS 63 07/30/2020   AST 23 07/30/2020   ALT 15 07/30/2020   PROT 6.5 07/30/2020   ALBUMIN 4.0 07/30/2020   CALCIUM 8.8 07/30/2020    Speciality Comments: No specialty comments available.  Procedures:  No procedures performed Allergies: Avelox [moxifloxacin hcl in nacl] and Sulfa antibiotics   Assessment / Plan:     Visit Diagnoses: Positive ANA (antinuclear antibody) - 07/30/20: ANA 1:40NS, TSH 1.69, B burgdorferi ab-, vitamin B12 423, Uric acid 4.8, RF 14, ESR 6  Myalgia  Celiac disease  Solitary pulmonary nodule  Mixed hyperlipidemia  PVC (premature ventricular contraction)  History of gastroesophageal reflux (GERD)  Family history of MI (myocardial infarction)  Family history of abdominal aortic aneurysm  Orders: No orders of the defined types were placed in this encounter.  No orders of the defined types were placed in this encounter.   Face-to-face time spent with patient was *** minutes. Greater than 50% of time was spent in counseling and coordination of care.  Follow-Up Instructions: No follow-ups on file.   Ofilia Neas, PA-C  Note - This record has been created using Dragon software.  Chart creation errors have been sought, but may not always  have been located. Such creation errors do not reflect on  the standard of medical care.

## 2020-10-21 NOTE — Telephone Encounter (Signed)
   [facility] Pre-operative Risk Assessment    Patient Name: NAOMII KREGER  DOB: July 21, 1960 MRN: [id number]  HEARTCARE STAFF:  - IMPORTANT!!!!!! Under Visit Info/Reason for Call, type in Other and utilize the format Clearance MM/DD/YY or Clearance TBD. Do not use dashes or single digits. - Please review there is not already an duplicate clearance open for this procedure. - If request is for dental extraction, please clarify the # of teeth to be extracted. - If the patient is currently at the dentist's office, call Pre-Op Callback Staff (MA/nurse) to input urgent request.  - If the patient is not currently in the dentist office, please route to the Pre-Op pool.  Request for surgical clearance:  What type of surgery is being performed? L Knee Scope  When is this surgery scheduled? 11/21/20  What type of clearance is required (medical clearance vs. Pharmacy clearance to hold med vs. Both)? Both  Are there any medications that need to be held prior to surgery and how long? TBD by Cardiology  Practice name and name of physician performing surgery? Dr. Melrose Nakayama , [facility]   What is the office phone number? [phone]   7.   What is the office fax number? [phone]  8.   Anesthesia type (None, local, MAC, general) ? Choice    Johnna Acosta 10/21/2020, 10:37 AM  _________________________________________________________________   (provider comments below)

## 2020-10-21 NOTE — Telephone Encounter (Signed)
   Primary Cardiologist: Werner Lean, MD  Chart reviewed as part of pre-operative protocol coverage. Given past medical history and time since last visit, based on ACC/AHA guidelines, [name] would be at acceptable risk for the planned procedure without further cardiovascular testing.   I will route this recommendation to the requesting party via Epic fax function and remove from pre-op pool.  Please call with questions.   Jossie Ng. Gracelynne Benedict NP-C    10/21/2020, 1:32 PM [facility] [address] Office [phone] Fax [phone] @ME1 @ 10/21/2020, 1:32 PM

## 2020-10-22 ENCOUNTER — Telehealth: Payer: Self-pay | Admitting: Family

## 2020-10-22 NOTE — Telephone Encounter (Signed)
We can actually cancel appointment for Friday since she just saw cardiology. I can do medical clearance for her without need for another visit. Cardiologist has given cardiac clearance as well;  Please send last OV note from June and labs from June.  I hope her surgery goes well and she feels better very quickly.

## 2020-10-22 NOTE — Telephone Encounter (Signed)
I have called pt back and informed her that we will cancel the appointment since she has been cleared by cardiology and her lab work was recently taken unless she had any other concerns for keeping it. Pt stated that she did not any additional concerns and was in agreement of the plan. Pt also reports that her husband has been diagnosed with covid today. She was going to call us back to let us know.   Pt is not feeling unwell and she is going to isolate herself from him for the time being.    FYI to provider.   Also clearance forms have been faxed

## 2020-10-25 ENCOUNTER — Ambulatory Visit: Payer: BC Managed Care – PPO | Admitting: Family

## 2020-10-30 ENCOUNTER — Telehealth: Payer: Self-pay | Admitting: Emergency Medicine

## 2020-10-30 NOTE — Telephone Encounter (Signed)
Fax received from Dr. Melrose Nakayama to perform a Left knee arthroscopy under choice anesthesia on patient scheduled for 11/21/20.  Patient needs surgery clearance. Patient was last seen on 06/04/20. Office protocol is a risk assessment can be sent to surgeon if patient has been seen in 60 days or less.   Patient has been called to schedule appt with Dr. Lamonte Sakai. Looks like first available is  11/13/20, [facility]

## 2020-11-04 ENCOUNTER — Ambulatory Visit: Payer: BC Managed Care – PPO | Admitting: Rheumatology

## 2020-11-04 ENCOUNTER — Telehealth (INDEPENDENT_AMBULATORY_CARE_PROVIDER_SITE_OTHER): Payer: BC Managed Care – PPO | Admitting: Family

## 2020-11-04 ENCOUNTER — Other Ambulatory Visit: Payer: Self-pay

## 2020-11-04 VITALS — HR 99 | Temp 98.2°F

## 2020-11-04 DIAGNOSIS — I493 Ventricular premature depolarization: Secondary | ICD-10-CM

## 2020-11-04 DIAGNOSIS — E782 Mixed hyperlipidemia: Secondary | ICD-10-CM

## 2020-11-04 DIAGNOSIS — Z8719 Personal history of other diseases of the digestive system: Secondary | ICD-10-CM

## 2020-11-04 DIAGNOSIS — Z8249 Family history of ischemic heart disease and other diseases of the circulatory system: Secondary | ICD-10-CM

## 2020-11-04 DIAGNOSIS — R768 Other specified abnormal immunological findings in serum: Secondary | ICD-10-CM

## 2020-11-04 DIAGNOSIS — J329 Chronic sinusitis, unspecified: Secondary | ICD-10-CM

## 2020-11-04 DIAGNOSIS — B9689 Other specified bacterial agents as the cause of diseases classified elsewhere: Secondary | ICD-10-CM

## 2020-11-04 DIAGNOSIS — K900 Celiac disease: Secondary | ICD-10-CM

## 2020-11-04 DIAGNOSIS — M791 Myalgia, unspecified site: Secondary | ICD-10-CM

## 2020-11-04 DIAGNOSIS — R911 Solitary pulmonary nodule: Secondary | ICD-10-CM

## 2020-11-04 MED ORDER — AMOXICILLIN-POT CLAVULANATE 875-125 MG PO TABS: 1.0000 | ORAL_TABLET | Freq: Two times a day (BID) | ORAL | 0 refills | Status: DC

## 2020-11-04 NOTE — Progress Notes (Signed)
MyChart Video Visit    Virtual Visit via Video Note   This visit type was conducted due to national recommendations for restrictions regarding the COVID-19 Pandemic (e.g. social distancing) in an effort to limit this patient's exposure and mitigate transmission in our community. This patient is at least at moderate risk for complications without adequate follow up. This format is felt to be most appropriate for this patient at this time. Physical exam was limited by quality of the video and audio technology used for the visit. CMA was able to get the patient set up on a video visit.  Patient location: Home. Patient and provider in visit Provider location: Office  I discussed the limitations of evaluation and management by telemedicine and the availability of in person appointments. The patient expressed understanding and agreed to proceed.  Visit Date: 11/04/2020  Today's healthcare provider: [facility]     Subjective:    Patient ID: Phyllis Morgan, female    DOB: 04-07-1960, 60 y.o.   MRN: [id number]  Chief Complaint  Patient presents with   possible sinus infection    Has been taking generic sudafed and afrin    Covid Positive    HPI Pt reports cold sx for 2 weeks, but sinus pain with headache, and pressure worsened over the weekend and did a home test for Covid which is positive. Denies sore throat, cough, or fever.   Past Medical History:  Diagnosis Date   Biliary dyskinesia    Celiac disease    Celiac disease 2013   Colon polyps    Gastric polyps    GERD (gastroesophageal reflux disease)    IDA (iron deficiency anemia)    Osteopenia 11/2018   T score -1.3 FRAX 6.5% / 0.4% stable from prior DEXA   Seasonal allergies     Past Surgical History:  Procedure Laterality Date   CESAREAN SECTION     CHOLECYSTECTOMY  2007   ENDOMETRIAL ABLATION  12/21/2006   HER OPTION CRYOABLATION    HAND SURGERY     THUMB/RIGHT HAND   KNEE SURGERY     Left eye retinal tear   12/18/2018   [facility]   OVARIAN CYST REMOVAL Right 1993   Rt Ovarian Cystectomy   PELVIC LAPAROSCOPY     OVARIAN CYSTECTOMY   TUBAL LIGATION  12/2002   [facility]   UMBILICAL HERNIA REPAIR      Family History  Problem Relation Age of Onset   Hypertension Mother    Heart disease Mother    Cancer Mother        SKIN CANCER   Hypertension Father    Cancer Father        COLON   Heart disease Maternal Grandmother    Cancer Paternal Grandmother        Female cancer  ?type   Colon cancer Neg Hx    Esophageal cancer Neg Hx    Rectal cancer Neg Hx    Stomach cancer Neg Hx     Social History   Socioeconomic History   Marital status: Married    Spouse name: Not on file   Number of children: 1   Years of education: Not on file   Highest education level: Not on file  Occupational History   Occupation: Freight forwarder  Tobacco Use   Smoking status: Never   Smokeless tobacco: Never  Vaping Use   Vaping Use: Never used  Substance and Sexual Activity   Alcohol use: Yes  Alcohol/week: 0.0 standard drinks    Comment: Rare   Drug use: No   Sexual activity: Yes    Birth control/protection: Surgical    Comment: Tubal lig-1st intercourse 60 yo-Fewer than 5 partners  Other Topics Concern   Not on file  Social History Narrative   Not on file   Social Determinants of Health   Financial Resource Strain: Not on file  Food Insecurity: Not on file  Transportation Needs: Not on file  Physical Activity: Not on file  Stress: Not on file  Social Connections: Not on file  Intimate Partner Violence: Not on file    Outpatient Medications Prior to Visit  Medication Sig Dispense Refill   albuterol (VENTOLIN HFA) 108 (90 Base) MCG/ACT inhaler Inhale 2 puffs into the lungs every 6 (six) hours as needed for wheezing or shortness of breath. 18 g 0   cetirizine (ZYRTEC) 10 MG chewable tablet Chew 10 mg by mouth daily.     Cholecalciferol (VITAMIN D PO) Take by mouth.      dexlansoprazole (DEXILANT) 60 MG capsule TAKE 1 CAPSULE BY MOUTH EVERY MORNING 90 capsule 4   estradiol (VIVELLE-DOT) 0.1 MG/24HR patch Place one patch onto the skin twice weekly. Pls fill with Novan generic. 24 patch 3   fluticasone (FLONASE) 50 MCG/ACT nasal spray Place 2 sprays into the nose daily.     GuaiFENesin (MUCINEX PO) Take by mouth.     Multiple Vitamin (MULTI-VITAMIN PO) Take by mouth.     progesterone (PROMETRIUM) 200 MG capsule Take 1 capsule (200 mg total) by mouth at bedtime. 90 capsule 3   pseudoephedrine (SUDAFED) 60 MG tablet Take 60 mg by mouth every 4 (four) hours as needed.     No facility-administered medications prior to visit.    Allergies  Allergen Reactions   Avelox [Moxifloxacin Hcl In Nacl] Nausea Only   Sulfa Antibiotics Other (See Comments)    Blurred vision    ROS See HPI     Objective:    Physical Exam Vitals and nursing note reviewed.  Constitutional:      Appearance: Normal appearance. She is obese.  HENT:     Head:     Comments: Notable swelling bilaterally under eye area  Neurological:     Mental Status: She is oriented to person, place, and time.  Psychiatric:        Judgment: Judgment normal.    Pulse 99   Temp 98.2 F (36.8 C)   LMP 10/10/2011   SpO2 100%  Wt Readings from Last 3 Encounters:  10/04/20 173 lb (78.5 kg)  10/03/20 173 lb (78.5 kg)  08/28/20 172 lb 6 oz (78.2 kg)       Assessment & Plan:   Problem List Items Addressed This Visit       Respiratory   Sinusitis, bacterial - Primary    Sinus HA, pain, pressure for 2 weeks, covid19 positive, viral syndrome turned bacterial. Sent Augmentin to pharmacy. Advised to continue Sudafed, Flonase (increase to 2 sprays bid for 1 week), and increase water intake.      Relevant Medications   amoxicillin-clavulanate (AUGMENTIN) 875-125 MG tablet    I am having Blanch Media B. Behrle start on amoxicillin-clavulanate. I am also having her maintain her GuaiFENesin (MUCINEX  PO), fluticasone, Cholecalciferol (VITAMIN D PO), Multiple Vitamin (MULTI-VITAMIN PO), pseudoephedrine, albuterol, dexlansoprazole, cetirizine, estradiol, and progesterone.  Meds ordered this encounter  Medications   amoxicillin-clavulanate (AUGMENTIN) 875-125 MG tablet    Sig: Take 1 tablet by  mouth 2 (two) times daily for 7 days.    Dispense:  14 tablet    Refill:  0    Order Specific Question:   Supervising Provider    Answer:   ANDY, CAMILLE L [[id number]]    I discussed the assessment and treatment plan with the patient. The patient was provided an opportunity to ask questions and all were answered. The patient agreed with the plan and demonstrated an understanding of the instructions.   The patient was advised to call back or seek an in-person evaluation if the symptoms worsen or if the condition fails to improve as anticipated.    [facility] Archivist at [place] (phone) [phone] (fax)  [place]

## 2020-11-04 NOTE — Assessment & Plan Note (Signed)
Sinus HA, pain, pressure for 2 weeks, covid19 positive, viral syndrome turned bacterial. Sent Augmentin to pharmacy. Advised to continue Sudafed, Flonase (increase to 2 sprays bid for 1 week), and increase water intake.

## 2020-11-04 NOTE — Patient Instructions (Signed)
Continue OTC Sudafed, Flonase, and Mucinex. Augmentin sent to your pharmacy. Call office if symptoms are no improving.

## 2020-11-10 ENCOUNTER — Encounter: Payer: Self-pay | Admitting: Family

## 2020-11-11 ENCOUNTER — Other Ambulatory Visit: Payer: Self-pay | Admitting: Family

## 2020-11-11 MED ORDER — AMOXICILLIN-POT CLAVULANATE 875-125 MG PO TABS: 1.0000 | ORAL_TABLET | Freq: Two times a day (BID) | ORAL | 0 refills | Status: AC

## 2020-11-11 NOTE — Telephone Encounter (Signed)
Next Appt With Pulmonology (Tammy Parrett, NP)11/14/2020 at 10:00 AM

## 2020-11-14 ENCOUNTER — Ambulatory Visit (INDEPENDENT_AMBULATORY_CARE_PROVIDER_SITE_OTHER): Payer: BC Managed Care – PPO | Admitting: Adult Health

## 2020-11-14 ENCOUNTER — Other Ambulatory Visit: Payer: Self-pay

## 2020-11-14 ENCOUNTER — Encounter: Payer: Self-pay | Admitting: Adult Health

## 2020-11-14 ENCOUNTER — Ambulatory Visit (INDEPENDENT_AMBULATORY_CARE_PROVIDER_SITE_OTHER): Payer: BC Managed Care – PPO

## 2020-11-14 VITALS — BP 118/78 | HR 98 | Temp 97.4°F | Ht 64.0 in | Wt 172.6 lb

## 2020-11-14 DIAGNOSIS — J302 Other seasonal allergic rhinitis: Secondary | ICD-10-CM

## 2020-11-14 DIAGNOSIS — R911 Solitary pulmonary nodule: Secondary | ICD-10-CM

## 2020-11-14 DIAGNOSIS — J42 Unspecified chronic bronchitis: Secondary | ICD-10-CM | POA: Diagnosis not present

## 2020-11-14 DIAGNOSIS — Z01811 Encounter for preprocedural respiratory examination: Secondary | ICD-10-CM

## 2020-11-14 NOTE — Assessment & Plan Note (Signed)
Check CT chest 04/2021

## 2020-11-14 NOTE — Assessment & Plan Note (Signed)
Continue on current regimen .   

## 2020-11-14 NOTE — Patient Instructions (Addendum)
Albuterol inhaler As needed   Activity as tolerated  Chest xray today .  Zyrtec 21m daily  Good luck with upcoming surgery  CT chest without contrast in 6 months   Follow up with Dr. BLamonte Sakaiin 6 months and As needed

## 2020-11-14 NOTE — Assessment & Plan Note (Signed)
Pulmonary preop pulmonary risk assessment  Patient with mild intermittent bronchitis and rhinitis -stable  Check chest xray . She is a never smoker. Fully independent .  Considered a mild pulmonary risk for surgery   Major Pulmonary risks identified in the multifactorial risk analysis are but not limited to a) pneumonia; b) recurrent intubation risk; c) prolonged or recurrent acute respiratory failure needing mechanical ventilation; d) prolonged hospitalization; e) DVT/Pulmonary embolism; f) Acute Pulmonary edema  Recommend 1. Short duration of surgery as much as possible and avoid paralytic if possible DVT prophylaxis if indicated.  Aggressive pulmonary toilet with o2, bronchodilatation, and incentive spirometry and early ambulation  Plan  Patient Instructions  Albuterol inhaler As needed   Activity as tolerated  Chest xray today .  Zyrtec 10m daily  Good luck with upcoming surgery  CT chest without contrast in 6 months   Follow up with Dr. BLamonte Sakaiin 6 months and As needed

## 2020-11-14 NOTE — Progress Notes (Signed)
@Patient  ID: [name], female    DOB: 02-10-1960, 60 y.o.   MRN: [id number]  Chief Complaint  Patient presents with   Follow-up    Referring provider: Marrian Salvage,*  HPI: 60 year old female never smoker seen for pulmonary consult Jun 04, 2020 for pulmonary nodule and chronic bronchitis  TEST/EVENTS :  Cardiac CT chest April 02, 2020 4 mm subpleural nodule in the right upper lobe.  Likely a benign subpleural lymph node.  11/14/2020 Follow up ; Chronic Bronchitis , PreOp pulmonary risk assessment  Patient presents for a follow-up visit.  She was seen in May 2022 for a pulmonary consult.  CT chest showed a small 4 mm right upper lobe pulmonary nodule.  Patient is a never smoker.  This was felt to be most likely benign in nature. She would like to have a 1 year CT follow up .  She also complained of some recurrent chronic bronchitis and chronic rhinitis symptoms.  Considered to be mild in nature. Says overall No breathing issues. Has been doing well.  Tested positive for Covid 19 2 weeks ago. Cold symptoms , treated for sinus infection . Finished antibiotics today . Feels like she has fully recovered.  Fully vaccinated for Covid 19 x 4.  Active. Work Warehouse manager. No breathing issues.  Exercised most days until knee issue .   Needs a pulmonary preop risk assessment . Undergoing Left knee arthroscopy procedure , planned 11/21/20. Dr. Latanya Maudlin .   Right knee surgery 3 yrs ago, did well with known issues .       Allergies  Allergen Reactions   Avelox [Moxifloxacin Hcl In Nacl] Nausea Only   Sulfa Antibiotics Other (See Comments)    Blurred vision    Immunization History  Administered Date(s) Administered   Influenza-Unspecified 01/02/2017, 11/17/2019   Moderna Sars-Covid-2 Vaccination 04/05/2019, 05/03/2019, 12/07/2019   PFIZER(Purple Top)SARS-COV-2 Vaccination 04/05/2019, 05/03/2019, 12/07/2019, 07/07/2020   Tdap 07/04/2018    Past Medical History:  Diagnosis  Date   Biliary dyskinesia    Celiac disease    Celiac disease 2013   Colon polyps    Gastric polyps    GERD (gastroesophageal reflux disease)    IDA (iron deficiency anemia)    Osteopenia 11/2018   T score -1.3 FRAX 6.5% / 0.4% stable from prior DEXA   Seasonal allergies     Tobacco History: Social History   Tobacco Use  Smoking Status Never  Smokeless Tobacco Never   Counseling given: Not Answered   Outpatient Medications Prior to Visit  Medication Sig Dispense Refill   albuterol (VENTOLIN HFA) 108 (90 Base) MCG/ACT inhaler Inhale 2 puffs into the lungs every 6 (six) hours as needed for wheezing or shortness of breath. 18 g 0   amoxicillin-clavulanate (AUGMENTIN) 875-125 MG tablet Take 1 tablet by mouth 2 (two) times daily for 3 days. 6 tablet 0   cetirizine (ZYRTEC) 10 MG chewable tablet Chew 10 mg by mouth daily.     Chlorphen-Phenyleph-Methscop (PHENYLEPHRINE CM PO) Take 10 mg by mouth. As needed     Cholecalciferol (VITAMIN D PO) Take by mouth.     dexlansoprazole (DEXILANT) 60 MG capsule TAKE 1 CAPSULE BY MOUTH EVERY MORNING 90 capsule 4   estradiol (VIVELLE-DOT) 0.1 MG/24HR patch Place one patch onto the skin twice weekly. Pls fill with Novan generic. 24 patch 3   fluticasone (FLONASE) 50 MCG/ACT nasal spray Place 2 sprays into the nose daily.     GuaiFENesin (MUCINEX PO) Take by mouth.  Multiple Vitamin (MULTI-VITAMIN PO) Take by mouth.     progesterone (PROMETRIUM) 200 MG capsule Take 1 capsule (200 mg total) by mouth at bedtime. 90 capsule 3   pseudoephedrine (SUDAFED) 60 MG tablet Take 60 mg by mouth every 4 (four) hours as needed.     No facility-administered medications prior to visit.     Review of Systems:   Constitutional:   No  weight loss, night sweats,  Fevers, chills, fatigue, or  lassitude.  HEENT:   No headaches,  Difficulty swallowing,  Tooth/dental problems, or  Sore throat,                No sneezing, itching, ear ache,  +nasal congestion,  post nasal drip,   CV:  No chest pain,  Orthopnea, PND, swelling in lower extremities, anasarca, dizziness, palpitations, syncope.   GI  No heartburn, indigestion, abdominal pain, nausea, vomiting, diarrhea, change in bowel habits, loss of appetite, bloody stools.   Resp: No shortness of breath with exertion or at rest.  No excess mucus, no productive cough,  No non-productive cough,  No coughing up of blood.  No change in color of mucus.  No wheezing.  No chest wall deformity  Skin: no rash or lesions.  GU: no dysuria, change in color of urine, no urgency or frequency.  No flank pain, no hematuria   MS:  No joint pain or swelling.  No decreased range of motion.  No back pain.    Physical Exam  BP 118/78 (BP Location: Left Arm, Cuff Size: Normal)   Pulse 98   Temp (!) 97.4 F (36.3 C)   Ht 5' 4"  (1.626 m)   Wt 172 lb 9.6 oz (78.3 kg)   LMP 10/10/2011   SpO2 98%   BMI 29.63 kg/m   GEN: A/Ox3; pleasant , NAD, well nourished    HEENT:  [place]/AT,  NOSE-clear, THROAT-clear, no lesions, no postnasal drip or exudate noted.   NECK:  Supple w/ fair ROM; no JVD; normal carotid impulses w/o bruits; no thyromegaly or nodules palpated; no lymphadenopathy.    RESP  Clear  P & A; w/o, wheezes/ rales/ or rhonchi. no accessory muscle use, no dullness to percussion  CARD:  RRR, no m/r/g, no peripheral edema, pulses intact, no cyanosis or clubbing.  GI:   Soft & nt; nml bowel sounds; no organomegaly or masses detected.   Musco: Warm bil, no deformities or joint swelling noted.   Neuro: alert, no focal deficits noted.    Skin: Warm, no lesions or rashes    Lab Results:  CBC    Component Value Date/Time   WBC 5.3 07/30/2020 1517   RBC 4.12 07/30/2020 1517   HGB 12.7 07/30/2020 1517   HCT 37.2 07/30/2020 1517   PLT 200.0 07/30/2020 1517   MCV 90.3 07/30/2020 1517   MCH 28.7 08/15/2019 0940   MCHC 34.2 07/30/2020 1517   RDW 13.3 07/30/2020 1517   LYMPHSABS 0.8 07/30/2020 1517    MONOABS 0.4 07/30/2020 1517   EOSABS 0.2 07/30/2020 1517   BASOSABS 0.1 07/30/2020 1517    BMET    Component Value Date/Time   NA 139 07/30/2020 1517   K 4.0 07/30/2020 1517   CL 105 07/30/2020 1517   CO2 26 07/30/2020 1517   GLUCOSE 91 07/30/2020 1517   BUN 20 07/30/2020 1517   CREATININE 1.08 07/30/2020 1517   CREATININE 0.94 08/15/2019 0940   CALCIUM 8.8 07/30/2020 1517    BNP No results found  for: BNP  ProBNP No results found for: PROBNP  Imaging: DG Chest 2 View  Result Date: 11/14/2020 CLINICAL DATA:  Preoperative clearance EXAM: CHEST - 2 VIEW COMPARISON:  2021 FINDINGS: There is a consolidative opacity within the right middle lobe. Additional rounded opacity at the left lung base. No pleural effusion. Normal heart size. No acute osseous abnormality. IMPRESSION: Right middle lobe consolidative opacity. Additional rounded opacity at the left lung base. May reflect pneumonia in the appropriate setting. Recommend follow-up imaging to ensure resolution. Electronically Signed   By: Macy Mis M.D.   On: 11/14/2020 11:19      No flowsheet data found.  No results found for: NITRICOXIDE      Assessment & Plan:   Chronic bronchitis, unspecified chronic bronchitis type ([place]) Mild -under control  Recent Covid 19 infection recovered .  Check chest xray today   Plan  Patient Instructions  Albuterol inhaler As needed   Activity as tolerated  Chest xray today .  Zyrtec 75m daily  Good luck with upcoming surgery  CT chest without contrast in 6 months   Follow up with Dr. BLamonte Sakaiin 6 months and As needed          Seasonal allergies Continue on current regimen   Solitary pulmonary nodule Check CT chest 04/2021   Preop pulmonary/respiratory exam Pulmonary preop pulmonary risk assessment  Patient with mild intermittent bronchitis and rhinitis -stable  Check chest xray . She is a never smoker. Fully independent .  Considered a mild pulmonary risk for  surgery   Major Pulmonary risks identified in the multifactorial risk analysis are but not limited to a) pneumonia; b) recurrent intubation risk; c) prolonged or recurrent acute respiratory failure needing mechanical ventilation; d) prolonged hospitalization; e) DVT/Pulmonary embolism; f) Acute Pulmonary edema  Recommend 1. Short duration of surgery as much as possible and avoid paralytic if possible DVT prophylaxis if indicated.  Aggressive pulmonary toilet with o2, bronchodilatation, and incentive spirometry and early ambulation  Plan  Patient Instructions  Albuterol inhaler As needed   Activity as tolerated  Chest xray today .  Zyrtec 110mdaily  Good luck with upcoming surgery  CT chest without contrast in 6 months   Follow up with Dr. ByLamonte Sakain 6 months and As needed              TaRexene EdisonNP 11/14/2020

## 2020-11-14 NOTE — H&P (View-Only) (Signed)
@Patient  ID: [name], female    DOB: 11-08-1960, 60 y.o.   MRN: [id number]  Chief Complaint  Patient presents with   Follow-up    Referring provider: Marrian Salvage,*  HPI: 60 year old female never smoker seen for pulmonary consult Jun 04, 2020 for pulmonary nodule and chronic bronchitis  TEST/EVENTS :  Cardiac CT chest April 02, 2020 4 mm subpleural nodule in the right upper lobe.  Likely a benign subpleural lymph node.  11/14/2020 Follow up ; Chronic Bronchitis , PreOp pulmonary risk assessment  Patient presents for a follow-up visit.  She was seen in May 2022 for a pulmonary consult.  CT chest showed a small 4 mm right upper lobe pulmonary nodule.  Patient is a never smoker.  This was felt to be most likely benign in nature. She would like to have a 1 year CT follow up .  She also complained of some recurrent chronic bronchitis and chronic rhinitis symptoms.  Considered to be mild in nature. Says overall No breathing issues. Has been doing well.  Tested positive for Covid 19 2 weeks ago. Cold symptoms , treated for sinus infection . Finished antibiotics today . Feels like she has fully recovered.  Fully vaccinated for Covid 19 x 4.  Active. Work Warehouse manager. No breathing issues.  Exercised most days until knee issue .   Needs a pulmonary preop risk assessment . Undergoing Left knee arthroscopy procedure , planned 11/21/20. Dr. Latanya Maudlin .   Right knee surgery 3 yrs ago, did well with known issues .       Allergies  Allergen Reactions   Avelox [Moxifloxacin Hcl In Nacl] Nausea Only   Sulfa Antibiotics Other (See Comments)    Blurred vision    Immunization History  Administered Date(s) Administered   Influenza-Unspecified 01/02/2017, 11/17/2019   Moderna Sars-Covid-2 Vaccination 04/05/2019, 05/03/2019, 12/07/2019   PFIZER(Purple Top)SARS-COV-2 Vaccination 04/05/2019, 05/03/2019, 12/07/2019, 07/07/2020   Tdap 07/04/2018    Past Medical History:  Diagnosis  Date   Biliary dyskinesia    Celiac disease    Celiac disease 2013   Colon polyps    Gastric polyps    GERD (gastroesophageal reflux disease)    IDA (iron deficiency anemia)    Osteopenia 11/2018   T score -1.3 FRAX 6.5% / 0.4% stable from prior DEXA   Seasonal allergies     Tobacco History: Social History   Tobacco Use  Smoking Status Never  Smokeless Tobacco Never   Counseling given: Not Answered   Outpatient Medications Prior to Visit  Medication Sig Dispense Refill   albuterol (VENTOLIN HFA) 108 (90 Base) MCG/ACT inhaler Inhale 2 puffs into the lungs every 6 (six) hours as needed for wheezing or shortness of breath. 18 g 0   amoxicillin-clavulanate (AUGMENTIN) 875-125 MG tablet Take 1 tablet by mouth 2 (two) times daily for 3 days. 6 tablet 0   cetirizine (ZYRTEC) 10 MG chewable tablet Chew 10 mg by mouth daily.     Chlorphen-Phenyleph-Methscop (PHENYLEPHRINE CM PO) Take 10 mg by mouth. As needed     Cholecalciferol (VITAMIN D PO) Take by mouth.     dexlansoprazole (DEXILANT) 60 MG capsule TAKE 1 CAPSULE BY MOUTH EVERY MORNING 90 capsule 4   estradiol (VIVELLE-DOT) 0.1 MG/24HR patch Place one patch onto the skin twice weekly. Pls fill with Novan generic. 24 patch 3   fluticasone (FLONASE) 50 MCG/ACT nasal spray Place 2 sprays into the nose daily.     GuaiFENesin (MUCINEX PO) Take by mouth.  Multiple Vitamin (MULTI-VITAMIN PO) Take by mouth.     progesterone (PROMETRIUM) 200 MG capsule Take 1 capsule (200 mg total) by mouth at bedtime. 90 capsule 3   pseudoephedrine (SUDAFED) 60 MG tablet Take 60 mg by mouth every 4 (four) hours as needed.     No facility-administered medications prior to visit.     Review of Systems:   Constitutional:   No  weight loss, night sweats,  Fevers, chills, fatigue, or  lassitude.  HEENT:   No headaches,  Difficulty swallowing,  Tooth/dental problems, or  Sore throat,                No sneezing, itching, ear ache,  +nasal congestion,  post nasal drip,   CV:  No chest pain,  Orthopnea, PND, swelling in lower extremities, anasarca, dizziness, palpitations, syncope.   GI  No heartburn, indigestion, abdominal pain, nausea, vomiting, diarrhea, change in bowel habits, loss of appetite, bloody stools.   Resp: No shortness of breath with exertion or at rest.  No excess mucus, no productive cough,  No non-productive cough,  No coughing up of blood.  No change in color of mucus.  No wheezing.  No chest wall deformity  Skin: no rash or lesions.  GU: no dysuria, change in color of urine, no urgency or frequency.  No flank pain, no hematuria   MS:  No joint pain or swelling.  No decreased range of motion.  No back pain.    Physical Exam  BP 118/78 (BP Location: Left Arm, Cuff Size: Normal)   Pulse 98   Temp (!) 97.4 F (36.3 C)   Ht 5' 4"  (1.626 m)   Wt 172 lb 9.6 oz (78.3 kg)   LMP 10/10/2011   SpO2 98%   BMI 29.63 kg/m   GEN: A/Ox3; pleasant , NAD, well nourished    HEENT:  [place]/AT,  NOSE-clear, THROAT-clear, no lesions, no postnasal drip or exudate noted.   NECK:  Supple w/ fair ROM; no JVD; normal carotid impulses w/o bruits; no thyromegaly or nodules palpated; no lymphadenopathy.    RESP  Clear  P & A; w/o, wheezes/ rales/ or rhonchi. no accessory muscle use, no dullness to percussion  CARD:  RRR, no m/r/g, no peripheral edema, pulses intact, no cyanosis or clubbing.  GI:   Soft & nt; nml bowel sounds; no organomegaly or masses detected.   Musco: Warm bil, no deformities or joint swelling noted.   Neuro: alert, no focal deficits noted.    Skin: Warm, no lesions or rashes    Lab Results:  CBC    Component Value Date/Time   WBC 5.3 07/30/2020 1517   RBC 4.12 07/30/2020 1517   HGB 12.7 07/30/2020 1517   HCT 37.2 07/30/2020 1517   PLT 200.0 07/30/2020 1517   MCV 90.3 07/30/2020 1517   MCH 28.7 08/15/2019 0940   MCHC 34.2 07/30/2020 1517   RDW 13.3 07/30/2020 1517   LYMPHSABS 0.8 07/30/2020 1517    MONOABS 0.4 07/30/2020 1517   EOSABS 0.2 07/30/2020 1517   BASOSABS 0.1 07/30/2020 1517    BMET    Component Value Date/Time   NA 139 07/30/2020 1517   K 4.0 07/30/2020 1517   CL 105 07/30/2020 1517   CO2 26 07/30/2020 1517   GLUCOSE 91 07/30/2020 1517   BUN 20 07/30/2020 1517   CREATININE 1.08 07/30/2020 1517   CREATININE 0.94 08/15/2019 0940   CALCIUM 8.8 07/30/2020 1517    BNP No results found  for: BNP  ProBNP No results found for: PROBNP  Imaging: DG Chest 2 View  Result Date: 11/14/2020 CLINICAL DATA:  Preoperative clearance EXAM: CHEST - 2 VIEW COMPARISON:  2021 FINDINGS: There is a consolidative opacity within the right middle lobe. Additional rounded opacity at the left lung base. No pleural effusion. Normal heart size. No acute osseous abnormality. IMPRESSION: Right middle lobe consolidative opacity. Additional rounded opacity at the left lung base. May reflect pneumonia in the appropriate setting. Recommend follow-up imaging to ensure resolution. Electronically Signed   By: Macy Mis M.D.   On: 11/14/2020 11:19      No flowsheet data found.  No results found for: NITRICOXIDE      Assessment & Plan:   Chronic bronchitis, unspecified chronic bronchitis type ([place]) Mild -under control  Recent Covid 19 infection recovered .  Check chest xray today   Plan  Patient Instructions  Albuterol inhaler As needed   Activity as tolerated  Chest xray today .  Zyrtec 1m daily  Good luck with upcoming surgery  CT chest without contrast in 6 months   Follow up with Dr. BLamonte Sakaiin 6 months and As needed          Seasonal allergies Continue on current regimen   Solitary pulmonary nodule Check CT chest 04/2021   Preop pulmonary/respiratory exam Pulmonary preop pulmonary risk assessment  Patient with mild intermittent bronchitis and rhinitis -stable  Check chest xray . She is a never smoker. Fully independent .  Considered a mild pulmonary risk for  surgery   Major Pulmonary risks identified in the multifactorial risk analysis are but not limited to a) pneumonia; b) recurrent intubation risk; c) prolonged or recurrent acute respiratory failure needing mechanical ventilation; d) prolonged hospitalization; e) DVT/Pulmonary embolism; f) Acute Pulmonary edema  Recommend 1. Short duration of surgery as much as possible and avoid paralytic if possible DVT prophylaxis if indicated.  Aggressive pulmonary toilet with o2, bronchodilatation, and incentive spirometry and early ambulation  Plan  Patient Instructions  Albuterol inhaler As needed   Activity as tolerated  Chest xray today .  Zyrtec 125mdaily  Good luck with upcoming surgery  CT chest without contrast in 6 months   Follow up with Dr. ByLamonte Sakain 6 months and As needed              TaRexene EdisonNP 11/14/2020

## 2020-11-14 NOTE — Assessment & Plan Note (Signed)
Mild -under control  Recent Covid 19 infection recovered .  Check chest xray today   Plan  Patient Instructions  Albuterol inhaler As needed   Activity as tolerated  Chest xray today .  Zyrtec 35m daily  Good luck with upcoming surgery  CT chest without contrast in 6 months   Follow up with Dr. BLamonte Sakaiin 6 months and As needed

## 2020-11-15 ENCOUNTER — Telehealth: Payer: Self-pay | Admitting: Adult Health

## 2020-11-15 NOTE — Telephone Encounter (Signed)
Noted.   TP please advise on CXR results. Thanks :)

## 2020-11-15 NOTE — Progress Notes (Signed)
Called and spoke with patient, scheduled patient for 4wk f/u 11/10 at 11 am, advised to arrive by 10:45 am for check in.  Patient states she was told that it was ok to proceed with her surgery and it will be prior to her f/u.

## 2020-11-15 NOTE — Telephone Encounter (Signed)
Risk assessment done with TP 11/14/20 and faxed to [place]

## 2020-11-15 NOTE — Telephone Encounter (Signed)
See phone message from 11/15/20. Will close duplicate encounter.

## 2020-11-18 ENCOUNTER — Other Ambulatory Visit: Payer: Self-pay | Admitting: Orthopaedic Surgery

## 2020-11-19 NOTE — Progress Notes (Signed)
DUE TO COVID-19 ONLY ONE VISITOR IS ALLOWED TO COME WITH YOU AND STAY IN THE WAITING ROOM ONLY DURING PRE OP AND PROCEDURE DAY OF SURGERY.  2 VISITOR  MAY VISIT WITH YOU AFTER SURGERY IN YOUR PRIVATE ROOM DURING VISITING HOURS ONLY!  YOU NEED TO HAVE A COVID 19 TEST ON______AME@_  @_from  8am-3pm _____, THIS TEST MUST BE DONE BEFORE SURGERY,  Covid test is done at [place], [place] [address].  This is a drive thru.  No appt required. Please see map.                 Your procedure is scheduled on:  11/26/2020   Report to [facility] Main  Entrance   Report to admitting at     12 noon      Call this number if you have problems the morning of surgery [phone]    REMEMBER: NO  SOLID FOOD CANDY OR GUM AFTER MIDNIGHT. CLEAR LIQUIDS UNTIL   1120am         . NOTHING BY MOUTH EXCEPT CLEAR LIQUIDS UNTIL  1120am   . PLEASE FINISH ENSURE DRINK PER SURGEON ORDER  WHICH NEEDS TO BE COMPLETED AT   1120am    .      CLEAR LIQUID DIET   Foods Allowed                                                                    Coffee and tea, regular and decaf                            Fruit ices (not with fruit pulp)                                      Iced Popsicles                                    Carbonated beverages, regular and diet                                    Cranberry, grape and apple juices Sports drinks like Gatorade Lightly seasoned clear broth or consume(fat free) Sugar, honey syrup ___________________________________________________________________      BRUSH YOUR TEETH MORNING OF SURGERY AND RINSE YOUR MOUTH OUT, NO CHEWING GUM CANDY OR MINTS.     Take these medicines the morning of surgery with A SIP OF WATER:  inhalers as usual and bring, zyrtec, dexilant, flonase   DO NOT TAKE ANY DIABETIC MEDICATIONS DAY OF YOUR SURGERY                               You may not have any metal on your body including hair pins and              piercings  Do not  wear jewelry, make-up, lotions, powders or perfumes, deodorant  Do not wear nail polish on your fingernails.  Do not shave  48 hours prior to surgery.              Men may shave face and neck.   Do not bring valuables to the hospital. [place].  Contacts, dentures or bridgework may not be worn into surgery.  Leave suitcase in the car. After surgery it may be brought to your room.     Patients discharged the day of surgery will not be allowed to drive home. IF YOU ARE HAVING SURGERY AND GOING HOME THE SAME DAY, YOU MUST HAVE AN ADULT TO DRIVE YOU HOME AND BE WITH YOU FOR 24 HOURS. YOU MAY GO HOME BY TAXI OR UBER OR ORTHERWISE, BUT AN ADULT MUST ACCOMPANY YOU HOME AND STAY WITH YOU FOR 24 HOURS.  Name and phone number of your driver:  Special Instructions: N/A              Please read over the following fact sheets you were given: _____________________________________________________________________  [facility] - Preparing for Surgery Before surgery, you can play an important role.  Because skin is not sterile, your skin needs to be as free of germs as possible.  You can reduce the number of germs on your skin by washing with CHG (chlorahexidine gluconate) soap before surgery.  CHG is an antiseptic cleaner which kills germs and bonds with the skin to continue killing germs even after washing. Please DO NOT use if you have an allergy to CHG or antibacterial soaps.  If your skin becomes reddened/irritated stop using the CHG and inform your nurse when you arrive at Short Stay. Do not shave (including legs and underarms) for at least 48 hours prior to the first CHG shower.  You may shave your face/neck. Please follow these instructions carefully:  1.  Shower with CHG Soap the night before surgery and the  morning of Surgery.  2.  If you choose to wash your hair, wash your hair first as usual with your  normal  shampoo.  3.  After you  shampoo, rinse your hair and body thoroughly to remove the  shampoo.                           4.  Use CHG as you would any other liquid soap.  You can apply chg directly  to the skin and wash                       Gently with a scrungie or clean washcloth.  5.  Apply the CHG Soap to your body ONLY FROM THE NECK DOWN.   Do not use on face/ open                           Wound or open sores. Avoid contact with eyes, ears mouth and genitals (private parts).                       Wash face,  Genitals (private parts) with your normal soap.             6.  Wash thoroughly, paying special attention to the area where your surgery  will be performed.  7.  Thoroughly rinse your body with  warm water from the neck down.  8.  DO NOT shower/wash with your normal soap after using and rinsing off  the CHG Soap.                9.  Pat yourself dry with a clean towel.            10.  Wear clean pajamas.            11.  Place clean sheets on your bed the night of your first shower and do not  sleep with pets. Day of Surgery : Do not apply any lotions/deodorants the morning of surgery.  Please wear clean clothes to the hospital/surgery center.  FAILURE TO FOLLOW THESE INSTRUCTIONS MAY RESULT IN THE CANCELLATION OF YOUR SURGERY PATIENT SIGNATURE_________________________________  NURSE SIGNATURE__________________________________  ________________________________________________________________________

## 2020-11-19 NOTE — Progress Notes (Addendum)
Anesthesia Review:  PCP: 11/04/2020 Jeanie Sewer video visit  Jodi Mourning - PCP  Cardiologist :10/21/20- Coletta Memos Hx of pvc's per pt  Pulm- 11/14/20- Tammy Parrett,NP OV  Chest x-ray : 11/14/20 2V  EKG : 01/12/2020  Echo : Stress test: Cardiac Cath :  Activity level: can do a flight of stairs without difficulty  Sleep Study/ CPAP : none  Fasting Blood Sugar :      / Checks Blood Sugar -- times a day:   Blood Thinner/ Instructions /Last Dose: ASA / Instructions/ Last Dose :   Home positive covid test on 11/02/2020.  Had Video visit on 11/04/2020 with [facility].  Was not placed on an antiviral just augmentin for sinus infection.  PT reports on 11/19/2020 no symptoms .   Seen on preop appt on 11/20/20.  CXR done 11/14/20 shows / pneumonia on Pulm office visit on 11/14/20.  Made Shawn Stall aware.  No new orders given.  No covid test ambulatory surgery also see 11/04/2020 video visit in epic.

## 2020-11-20 ENCOUNTER — Encounter (HOSPITAL_COMMUNITY): Payer: Self-pay

## 2020-11-20 ENCOUNTER — Other Ambulatory Visit: Payer: Self-pay

## 2020-11-20 ENCOUNTER — Encounter (HOSPITAL_COMMUNITY)
Admission: RE | Admit: 2020-11-20 | Discharge: 2020-11-20 | Disposition: A | Discharge status: Home / Self Care | Payer: BC Managed Care – PPO | Source: Ambulatory Visit | Attending: Orthopaedic Surgery | Admitting: Orthopaedic Surgery

## 2020-11-20 VITALS — BP 113/43 | HR 87 | Temp 98.3°F | Resp 16 | Ht 64.0 in | Wt 167.0 lb

## 2020-11-20 DIAGNOSIS — Z01818 Encounter for other preprocedural examination: Secondary | ICD-10-CM | POA: Diagnosis not present

## 2020-11-20 DIAGNOSIS — I493 Ventricular premature depolarization: Secondary | ICD-10-CM | POA: Insufficient documentation

## 2020-11-20 HISTORY — DX: Unspecified asthma, uncomplicated: J45.909

## 2020-11-20 HISTORY — DX: Nausea with vomiting, unspecified: R11.2

## 2020-11-20 HISTORY — DX: Personal history of urinary calculi: Z87.442

## 2020-11-20 HISTORY — DX: Other specified postprocedural states: Z98.890

## 2020-11-20 HISTORY — DX: Pneumonia, unspecified organism: J18.9

## 2020-11-20 HISTORY — DX: Nausea with vomiting, unspecified: Z98.890

## 2020-11-20 HISTORY — DX: Headache, unspecified: R51.9

## 2020-11-20 LAB — CBC
HCT: 37.8 % (ref 36.0–46.0)
Hemoglobin: 12.7 g/dL (ref 12.0–15.0)
MCH: 30.2 pg (ref 26.0–34.0)
MCHC: 33.6 g/dL (ref 30.0–36.0)
MCV: 89.8 fL (ref 80.0–100.0)
Platelets: 241 10*3/uL (ref 150–400)
RBC: 4.21 MIL/uL (ref 3.87–5.11)
RDW: 12.5 % (ref 11.5–15.5)
WBC: 4.8 10*3/uL (ref 4.0–10.5)
nRBC: 0 % (ref 0.0–0.2)

## 2020-11-20 LAB — BASIC METABOLIC PANEL
Anion gap: 5 (ref 5–15)
BUN: 17 mg/dL (ref 6–20)
CO2: 27 mmol/L (ref 22–32)
Calcium: 9.1 mg/dL (ref 8.9–10.3)
Chloride: 108 mmol/L (ref 98–111)
Creatinine, Ser: 0.87 mg/dL (ref 0.44–1.00)
GFR, Estimated: >60 mL/min (ref >60)
Glucose, Bld: 109 mg/dL — ABNORMAL HIGH (ref 70–99)
Potassium: 3.9 mmol/L (ref 3.5–5.1)
Sodium: 140 mmol/L (ref 135–145)

## 2020-11-20 NOTE — H&P (Signed)
Phyllis Morgan is an 60 y.o. female.   Chief Complaint: left knee pain HPI: Phyllis Morgan continues with some terrible left knee pain.  Her discomfort is mostly along the inside aspect.  She cannot exercise due to the pain.  She also cannot sleep at night.  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  We did inject her a while back and she got a day or 2 from that.  She says it feels identical to how her opposite knee felt prior to the arthroscopy back in 2019.   MRI:  I reviewed an MRI scan films and report of a study done at the [place] on 09/25/20 of the left knee demonstrates no meniscal pathology.  She does have some mild medial compartment osteoarthritis and a moderate joint effusion.  Past Medical History:  Diagnosis Date  . Biliary dyskinesia   . Celiac disease   . Celiac disease 2013  . Colon polyps   . Gastric polyps   . GERD (gastroesophageal reflux disease)   . IDA (iron deficiency anemia)   . Osteopenia 11/2018   T score -1.3 FRAX 6.5% / 0.4% stable from prior DEXA  . Seasonal allergies     Past Surgical History:  Procedure Laterality Date  . CESAREAN SECTION    . CHOLECYSTECTOMY  2007  . ENDOMETRIAL ABLATION  12/21/2006   HER OPTION CRYOABLATION   . HAND SURGERY     THUMB/RIGHT HAND  . KNEE SURGERY    . Left eye retinal tear  12/18/2018   [employer]  . OVARIAN CYST REMOVAL Right 1993   Rt Ovarian Cystectomy  . PELVIC LAPAROSCOPY     OVARIAN CYSTECTOMY  . TUBAL LIGATION  12/2002   FALLOPE RINGS  . UMBILICAL HERNIA REPAIR      Family History  Problem Relation Age of Onset  . Hypertension Mother   . Heart disease Mother   . Cancer Mother        SKIN CANCER  . Hypertension Father   . Cancer Father        COLON  . Heart disease Maternal Grandmother   . Cancer Paternal Grandmother        Female cancer  ?type  . Colon cancer Neg Hx   . Esophageal cancer Neg Hx   . Rectal cancer Neg Hx   . Stomach cancer Neg Hx    Social History:  reports  that she has never smoked. She has never used smokeless tobacco. She reports current alcohol use. She reports that she does not use drugs.  Allergies:  Allergies  Allergen Reactions  . Avelox [Moxifloxacin Hcl In Nacl] Nausea Only  . Sulfa Antibiotics Other (See Comments)    Blurred vision    No medications prior to admission.    No results found for this or any previous visit (from the past 48 hour(s)). No results found.  Review of Systems  Musculoskeletal:  Positive for arthralgias.       Left knee  All other systems reviewed and are negative.  Last menstrual period 10/10/2011. Physical Exam Constitutional:      Appearance: Normal appearance.  HENT:     Head: Normocephalic and atraumatic.     Nose: Nose normal.     Mouth/Throat:     Pharynx: Oropharynx is clear.  Eyes:     Extraocular Movements: Extraocular movements intact.  Cardiovascular:     Rate and Rhythm: Normal rate and regular rhythm.  Pulmonary:     Effort: Pulmonary  effort is normal.  Abdominal:     Palpations: Abdomen is soft.  Musculoskeletal:     Cervical back: Normal range of motion.     Comments: Left knee persists with trace effusion.  She has intense medial joint line pain and McMurray's test which is positive for pain in that direction.  I do not feel much crepitation.  Ligaments are stable.  Hip motion is full and straight leg raise is negative.  Skin:    General: Skin is warm and dry.  Neurological:     General: No focal deficit present.     Mental Status: She is alert and oriented to person, place, and time. Mental status is at baseline.  Psychiatric:        Mood and Affect: Mood normal.        Behavior: Behavior normal.        Thought Content: Thought content normal.        Judgment: Judgment normal.     Assessment/Plan Assessment: Left knee pain with MRI 2022 injected most recently 09/30/20  Plan: Phyllis Morgan persists with some terrible medial joint line pain.  She cannot exercise nor can she  rest at night.  The MRI scan to my reading shows some suggestion of a posterior horn medial meniscus tear though that is not the reading of the radiologist.  She says it feels identical to how the opposite knee felt prior to the arthroscopy in 2019.  On that side we did find a medial meniscus tear.  I think we can help her with a knee arthroscopy. I reviewed risks of anesthesia and infection as well as potential for DVT related to a knee arthroscopy.  I've stressed the importance of some postoperative physical therapy to optimize results and we will try to set up an appointment.  Two to four  weeks for recovery would be typical but that is a little variable.   Phyllis Sachs Tacoya Altizer, PA-C 11/20/2020, 8:27 AM

## 2020-11-21 NOTE — Progress Notes (Signed)
Anesthesia Chart Review:   Case: [id number] Date/Time: 11/26/20 1345   Procedure: LEFT KNEE ARTHROSCOPY (Left: Knee)   Anesthesia type: Choice   Pre-op diagnosis: LEFT KNEE MEDIAL MENISCUS TEAR AND CHONDRALMALACIA   Location: [facility] ROOM 06 / [facility]   Surgeons: Melrose Nakayama, MD       DISCUSSION: Pt is 60 years old with hx asthma, chronic bronchitis, IDA, pulmonary nodule (suspected to be benign - due for 1 year f/u CT in 6 months),   Pt tested positive for covid 11/02/20. No symptoms at this time.   VS: BP (!) 113/43   Pulse 87   Temp 36.8 C (Oral)   Resp 16   Ht 5' 4"  (1.626 m)   Wt 75.8 kg   LMP 10/10/2011   SpO2 99%   BMI 28.67 kg/m   PROVIDERS: - PCP is Marrian Salvage, FNP  - Cardiologist is Rudean Haskell, MD who sees pt for PVC, hyperlipidemia, family hx of MI; last office visit 10/04/20. Cleared for surgery by Coletta Memos, NP at acceptable risk on 10/21/20  - Pulmonology care by Rexene Edison, NP. Cleared for surgery at mild pulmonary risk at last office visit 11/14/20: "Recommend 1. Short duration of surgery as much as possible and avoid paralytic if possible DVT prophylaxis if indicated.  Aggressive pulmonary toilet with o2, bronchodilatation, and incentive spirometry and early ambulation"    LABS: Labs reviewed: Acceptable for surgery. (all labs ordered are listed, but only abnormal results are displayed)  Labs Reviewed  BASIC METABOLIC PANEL - Abnormal; Notable for the following components:      Result Value   Glucose, Bld 109 (*)    All other components within normal limits  CBC     IMAGES: CXR 11/14/20:  - Right middle lobe consolidative opacity. Additional rounded opacity at the left lung base. May reflect pneumonia in the appropriate setting. Recommend follow-up imaging to ensure resolution (Note Rexene Edison, NP reviewed CXR and documents pt will need f/u CXR in 4 weeks and can proceed with surgery if she is feeling well)  EKG  01/12/20: SR   CV: CT coronary morphology 04/02/20:  1. Coronary artery calcium score 0 Agatston units, suggesting low risk for future cardiac events. 2.  No significant coronary disease noted.   Past Medical History:  Diagnosis Date   Asthma    exercise induced   Biliary dyskinesia    Celiac disease    Celiac disease 2013   Colon polyps    Gastric polyps    GERD (gastroesophageal reflux disease)    Headache    History of kidney stones    IDA (iron deficiency anemia)    Osteopenia 11/2018   T score -1.3 FRAX 6.5% / 0.4% stable from prior DEXA   Pneumonia    as a child   PONV (postoperative nausea and vomiting)    Seasonal allergies     Past Surgical History:  Procedure Laterality Date   CESAREAN SECTION     CHOLECYSTECTOMY  2007   ENDOMETRIAL ABLATION  12/21/2006   HER OPTION CRYOABLATION    HAND SURGERY     THUMB/RIGHT HAND   KNEE SURGERY     Left eye retinal tear  12/18/2018   [facility]   OVARIAN CYST REMOVAL Right 1993   Rt Ovarian Cystectomy   PELVIC LAPAROSCOPY     OVARIAN CYSTECTOMY   TUBAL LIGATION  12/2002   FALLOPE RINGS   UMBILICAL HERNIA REPAIR      MEDICATIONS:  acetaminophen (TYLENOL) 500 MG tablet   albuterol (VENTOLIN HFA) 108 (90 Base) MCG/ACT inhaler   cetirizine (ZYRTEC) 10 MG tablet   cholecalciferol (VITAMIN D) 25 MCG (1000 UNIT) tablet   dexlansoprazole (DEXILANT) 60 MG capsule   estradiol (VIVELLE-DOT) 0.1 MG/24HR patch   fluticasone (FLONASE) 50 MCG/ACT nasal spray   guaifenesin (HUMIBID E) 400 MG TABS tablet   Multiple Vitamin (MULTIVITAMIN WITH MINERALS) TABS tablet   phenylephrine (SUDAFED PE) 10 MG TABS tablet   progesterone (PROMETRIUM) 200 MG capsule   No current facility-administered medications for this encounter.    If no changes, I anticipate pt can proceed with surgery as scheduled.   Willeen Cass, PhD, FNP-BC [facility]/Anesthesiology Phone: [phone] 11/21/2020 10:06  AM

## 2020-11-21 NOTE — Anesthesia Preprocedure Evaluation (Addendum)
Anesthesia Evaluation  Patient identified by MRN, date of birth, ID band Patient awake    Reviewed: Allergy & Precautions, NPO status , Patient's Chart, lab work & pertinent test results  History of Anesthesia Complications (+) PONV  Airway Mallampati: II  TM Distance: >3 FB Neck ROM: Full    Dental  (+) Teeth Intact   Pulmonary asthma ,    Pulmonary exam normal        Cardiovascular negative cardio ROS   Rhythm:Regular Rate:Normal     Neuro/Psych  Headaches, negative psych ROS   GI/Hepatic Neg liver ROS, GERD  ,  Endo/Other  negative endocrine ROS  Renal/GU negative Renal ROS  negative genitourinary   Musculoskeletal Left medial meniscus tear   Abdominal (+)  Abdomen: soft.    Peds  Hematology  (+) anemia ,   Anesthesia Other Findings   Reproductive/Obstetrics                             Anesthesia Physical Anesthesia Plan  ASA: 2  Anesthesia Plan: General   Post-op Pain Management:    Induction: Intravenous  PONV Risk Score and Plan: 4 or greater and Ondansetron, Aprepitant, Dexamethasone, Midazolam, Scopolamine patch - Pre-op, Treatment may vary due to age or medical condition and Amisulpride  Airway Management Planned: Mask and LMA  Additional Equipment: None  Intra-op Plan:   Post-operative Plan: Extubation in OR  Informed Consent: I have reviewed the patients History and Physical, chart, labs and discussed the procedure including the risks, benefits and alternatives for the proposed anesthesia with the patient or authorized representative who has indicated his/her understanding and acceptance.     Dental advisory given  Plan Discussed with: CRNA  Anesthesia Plan Comments: (See APP note by Durel Salts, FNP   Lab Results      Component                Value               Date                      WBC                      4.8                 11/20/2020                 HGB                      12.7                11/20/2020                HCT                      37.8                11/20/2020                MCV                      89.8                11/20/2020                PLT  241                 11/20/2020           Lab Results      Component                Value               Date                      NA                       140                 11/20/2020                K                        3.9                 11/20/2020                CO2                      27                  11/20/2020                GLUCOSE                  109 (H)             11/20/2020                BUN                      17                  11/20/2020                CREATININE               0.87                11/20/2020                CALCIUM                  9.1                 11/20/2020                GFRNONAA                 >60                 11/20/2020          )      Anesthesia Quick Evaluation

## 2020-11-25 ENCOUNTER — Other Ambulatory Visit: Payer: Self-pay | Admitting: Family

## 2020-11-25 DIAGNOSIS — Z86010 Personal history of colonic polyps: Secondary | ICD-10-CM

## 2020-11-25 MED ORDER — ALBUTEROL SULFATE HFA 108 (90 BASE) MCG/ACT IN AERS: 2.0000 | INHALATION_SPRAY | Freq: Four times a day (QID) | RESPIRATORY_TRACT | 0 refills | Status: DC | PRN

## 2020-11-26 ENCOUNTER — Ambulatory Visit (HOSPITAL_COMMUNITY)
Admission: RE | Admit: 2020-11-26 | Discharge: 2020-11-26 | Disposition: A | Discharge status: Home / Self Care | Payer: BC Managed Care – PPO | Source: Ambulatory Visit | Attending: Orthopaedic Surgery | Admitting: Orthopaedic Surgery

## 2020-11-26 ENCOUNTER — Encounter (HOSPITAL_COMMUNITY): Payer: Self-pay | Admitting: Orthopaedic Surgery

## 2020-11-26 ENCOUNTER — Ambulatory Visit (HOSPITAL_COMMUNITY): Payer: BC Managed Care – PPO | Admitting: Anesthesiology

## 2020-11-26 ENCOUNTER — Ambulatory Visit (HOSPITAL_COMMUNITY): Payer: BC Managed Care – PPO | Admitting: Physician Assistant

## 2020-11-26 ENCOUNTER — Encounter (HOSPITAL_COMMUNITY)
Admission: RE | Disposition: A | Discharge status: Home / Self Care | Payer: Self-pay | Source: Ambulatory Visit | Attending: Orthopaedic Surgery

## 2020-11-26 DIAGNOSIS — Z888 Allergy status to other drugs, medicaments and biological substances status: Secondary | ICD-10-CM | POA: Insufficient documentation

## 2020-11-26 DIAGNOSIS — S83242A Other tear of medial meniscus, current injury, left knee, initial encounter: Secondary | ICD-10-CM | POA: Diagnosis not present

## 2020-11-26 DIAGNOSIS — X58XXXA Exposure to other specified factors, initial encounter: Secondary | ICD-10-CM | POA: Insufficient documentation

## 2020-11-26 DIAGNOSIS — Z882 Allergy status to sulfonamides status: Secondary | ICD-10-CM | POA: Diagnosis not present

## 2020-11-26 DIAGNOSIS — M1712 Unilateral primary osteoarthritis, left knee: Secondary | ICD-10-CM | POA: Diagnosis not present

## 2020-11-26 HISTORY — PX: KNEE ARTHROSCOPY: SHX127

## 2020-11-26 SURGERY — ARTHROSCOPY, KNEE
Anesthesia: General | Site: Knee | Laterality: Left

## 2020-11-26 MED ORDER — PROPOFOL 10 MG/ML IV BOLUS
INTRAVENOUS | Status: AC
  Filled 2020-11-26: qty 20

## 2020-11-26 MED ORDER — CEFAZOLIN SODIUM-DEXTROSE 2-4 GM/100ML-% IV SOLN
2.0000 g | INTRAVENOUS | Status: AC
  Administered 2020-11-26: 2 g via INTRAVENOUS
  Filled 2020-11-26: qty 100

## 2020-11-26 MED ORDER — LIDOCAINE 2% (20 MG/ML) 5 ML SYRINGE
INTRAMUSCULAR | Status: DC | PRN
  Administered 2020-11-26: 50 mg via INTRAVENOUS

## 2020-11-26 MED ORDER — FENTANYL CITRATE PF 50 MCG/ML IJ SOSY
25.0000 ug | PREFILLED_SYRINGE | INTRAMUSCULAR | Status: DC | PRN
  Administered 2020-11-26: 25 ug via INTRAVENOUS

## 2020-11-26 MED ORDER — BUPIVACAINE-EPINEPHRINE 0.5% -1:200000 IJ SOLN
INTRAMUSCULAR | Status: DC | PRN
  Administered 2020-11-26: 20 mL

## 2020-11-26 MED ORDER — ACETAMINOPHEN 10 MG/ML IV SOLN
INTRAVENOUS | Status: AC
  Filled 2020-11-26: qty 100

## 2020-11-26 MED ORDER — ACETAMINOPHEN 10 MG/ML IV SOLN
1000.0000 mg | Freq: Once | INTRAVENOUS | Status: DC | PRN
  Administered 2020-11-26: 1000 mg via INTRAVENOUS

## 2020-11-26 MED ORDER — LACTATED RINGERS IV SOLN: INTRAVENOUS | Status: DC

## 2020-11-26 MED ORDER — FENTANYL CITRATE (PF) 100 MCG/2ML IJ SOLN
INTRAMUSCULAR | Status: DC | PRN
  Administered 2020-11-26 (×2): 25 ug via INTRAVENOUS
  Administered 2020-11-26: 50 ug via INTRAVENOUS

## 2020-11-26 MED ORDER — ORAL CARE MOUTH RINSE: 15.0000 mL | Freq: Once | OROMUCOSAL | Status: AC

## 2020-11-26 MED ORDER — ORAL CARE MOUTH RINSE: 15.0000 mL | Freq: Once | OROMUCOSAL | Status: DC

## 2020-11-26 MED ORDER — MIDAZOLAM HCL 2 MG/2ML IJ SOLN
INTRAMUSCULAR | Status: AC
  Filled 2020-11-26: qty 2

## 2020-11-26 MED ORDER — DEXAMETHASONE SODIUM PHOSPHATE 10 MG/ML IJ SOLN
INTRAMUSCULAR | Status: DC | PRN
  Administered 2020-11-26: 10 mg via INTRAVENOUS

## 2020-11-26 MED ORDER — AMISULPRIDE (ANTIEMETIC) 5 MG/2ML IV SOLN: 10.0000 mg | Freq: Once | INTRAVENOUS | Status: DC | PRN

## 2020-11-26 MED ORDER — SODIUM CHLORIDE 0.9 % IR SOLN
Status: DC | PRN
  Administered 2020-11-26: 3000 mL

## 2020-11-26 MED ORDER — FENTANYL CITRATE (PF) 100 MCG/2ML IJ SOLN
INTRAMUSCULAR | Status: AC
  Filled 2020-11-26: qty 2

## 2020-11-26 MED ORDER — CHLORHEXIDINE GLUCONATE 0.12 % MT SOLN
15.0000 mL | Freq: Once | OROMUCOSAL | Status: AC
  Administered 2020-11-26: 15 mL via OROMUCOSAL

## 2020-11-26 MED ORDER — PROPOFOL 500 MG/50ML IV EMUL
INTRAVENOUS | Status: DC | PRN
  Administered 2020-11-26: 150 mg via INTRAVENOUS

## 2020-11-26 MED ORDER — ONDANSETRON HCL 4 MG/2ML IJ SOLN
INTRAMUSCULAR | Status: DC | PRN
  Administered 2020-11-26: 4 mg via INTRAVENOUS

## 2020-11-26 MED ORDER — MIDAZOLAM HCL 2 MG/2ML IJ SOLN
INTRAMUSCULAR | Status: DC | PRN
  Administered 2020-11-26: 2 mg via INTRAVENOUS

## 2020-11-26 MED ORDER — BUPIVACAINE-EPINEPHRINE (PF) 0.5% -1:200000 IJ SOLN
INTRAMUSCULAR | Status: AC
  Filled 2020-11-26: qty 30

## 2020-11-26 MED ORDER — EPHEDRINE SULFATE-NACL 50-0.9 MG/10ML-% IV SOSY
PREFILLED_SYRINGE | INTRAVENOUS | Status: DC | PRN
  Administered 2020-11-26: 10 mg via INTRAVENOUS

## 2020-11-26 MED ORDER — EPHEDRINE 5 MG/ML INJ
INTRAVENOUS | Status: AC
  Filled 2020-11-26: qty 10

## 2020-11-26 MED ORDER — TRAMADOL HCL 50 MG PO TABS: 50.0000 mg | ORAL_TABLET | Freq: Four times a day (QID) | ORAL | 0 refills | Status: DC | PRN

## 2020-11-26 MED ORDER — SCOPOLAMINE 1 MG/3DAYS TD PT72
1.0000 | MEDICATED_PATCH | TRANSDERMAL | Status: DC
  Filled 2020-11-26: qty 1

## 2020-11-26 MED ORDER — FENTANYL CITRATE PF 50 MCG/ML IJ SOSY
PREFILLED_SYRINGE | INTRAMUSCULAR | Status: AC
  Administered 2020-11-26: 25 ug via INTRAVENOUS
  Filled 2020-11-26: qty 1

## 2020-11-26 MED ORDER — CHLORHEXIDINE GLUCONATE 0.12 % MT SOLN: 15.0000 mL | Freq: Once | OROMUCOSAL | Status: DC

## 2020-11-26 SURGICAL SUPPLY — 33 items
BAG COUNTER SPONGE SURGICOUNT (BAG) IMPLANT
BLADE EXCALIBUR 4.0X13 (MISCELLANEOUS) ×2 IMPLANT
BNDG ELASTIC 6X5.8 VLCR STR LF (GAUZE/BANDAGES/DRESSINGS) ×2 IMPLANT
BNDG GAUZE ELAST 4 BULKY (GAUZE/BANDAGES/DRESSINGS) ×2 IMPLANT
BURR OVAL 8 FLU 4.0X13 (MISCELLANEOUS) IMPLANT
COVER SURGICAL LIGHT HANDLE (MISCELLANEOUS) ×2 IMPLANT
DISSECTOR  3.8MM X 13CM (MISCELLANEOUS) ×2
DISSECTOR 3.5MM X 13CM (MISCELLANEOUS) IMPLANT
DISSECTOR 3.8MM X 13CM (MISCELLANEOUS) ×1 IMPLANT
DRAPE ARTHROSCOPY W/POUCH 114 (DRAPES) ×2 IMPLANT
DRAPE SHEET LG 3/4 BI-LAMINATE (DRAPES) ×2 IMPLANT
DRAPE U-SHAPE 47X51 STRL (DRAPES) ×2 IMPLANT
DRSG EMULSION OIL 3X3 NADH (GAUZE/BANDAGES/DRESSINGS) ×2 IMPLANT
DRSG PAD ABDOMINAL 8X10 ST (GAUZE/BANDAGES/DRESSINGS) ×4 IMPLANT
DURAPREP 26ML APPLICATOR (WOUND CARE) ×4 IMPLANT
GAUZE SPONGE 4X4 12PLY STRL (GAUZE/BANDAGES/DRESSINGS) ×2 IMPLANT
GLOVE SRG 8 PF TXTR STRL LF DI (GLOVE) ×2 IMPLANT
GLOVE SURG ENC MOIS LTX SZ8 (GLOVE) ×4 IMPLANT
GLOVE SURG UNDER POLY LF SZ8 (GLOVE) ×4
GOWN STRL REUS W/ TWL XL LVL3 (GOWN DISPOSABLE) ×2 IMPLANT
GOWN STRL REUS W/TWL XL LVL3 (GOWN DISPOSABLE) ×4
KIT BASIN OR (CUSTOM PROCEDURE TRAY) ×2 IMPLANT
MANIFOLD NEPTUNE II (INSTRUMENTS) ×2 IMPLANT
NEEDLE HYPO 22GX1.5 SAFETY (NEEDLE) ×2 IMPLANT
NEEDLE SPNL 18GX3.5 QUINCKE PK (NEEDLE) ×2 IMPLANT
PACK ARTHROSCOPY WL (CUSTOM PROCEDURE TRAY) ×2 IMPLANT
PAD ARMBOARD 7.5X6 YLW CONV (MISCELLANEOUS) IMPLANT
PENCIL SMOKE EVACUATOR (MISCELLANEOUS) IMPLANT
SYR CONTROL 10ML LL (SYRINGE) ×2 IMPLANT
TOWEL OR 17X26 10 PK STRL BLUE (TOWEL DISPOSABLE) ×2 IMPLANT
TUBING ARTHROSCOPY IRRIG 16FT (MISCELLANEOUS) ×2 IMPLANT
WATER STERILE IRR 1000ML POUR (IV SOLUTION) IMPLANT
WRAP KNEE MAXI GEL POST OP (GAUZE/BANDAGES/DRESSINGS) ×2 IMPLANT

## 2020-11-26 NOTE — Op Note (Signed)
LOIE JAHR [id number] 11/26/2020   PRE-OP DIAGNOSIS: left knee TMM  POST-OP DIAGNOSIS: same  PROCEDURE: left knee scope PMM  ANESTHESIA: general  Hessie Dibble   Dictation #:  [id number]

## 2020-11-26 NOTE — Interval H&P Note (Signed)
History and Physical Interval Note:  11/26/2020 1:08 PM  Phyllis Morgan  has presented today for surgery, with the diagnosis of LEFT KNEE MEDIAL MENISCUS [place].  The various methods of treatment have been discussed with the patient and family. After consideration of risks, benefits and other options for treatment, the patient has consented to  Procedure(s): LEFT KNEE ARTHROSCOPY (Left) as a surgical intervention.  The patient's history has been reviewed, patient examined, no change in status, stable for surgery.  I have reviewed the patient's chart and labs.  Questions were answered to the patient's satisfaction.     Hessie Dibble

## 2020-11-26 NOTE — Op Note (Signed)
NAME: Phyllis Morgan, Phyllis Morgan MEDICAL RECORD NO: [id number] ACCOUNT NO: [id number] DATE OF BIRTH: 05-12-1960 FACILITY: [facility] LOCATION: [facility] PHYSICIAN: Monico Blitz. Rhona Raider, MD  Operative Report   DATE OF PROCEDURE: 11/26/2020  PREOPERATIVE DIAGNOSIS:  Left knee torn medial meniscus.  POSTOPERATIVE DIAGNOSIS:  Left knee torn medial meniscus.  PROCEDURE:  Left knee partial medial meniscectomy.  ANESTHESIA:  General.  ATTENDING SURGEON:  Monico Blitz. Rhona Raider, MD  ASSISTANT:  Loni Dolly, PA.  INDICATIONS:  The patient is a 60 year old woman with many months of left knee pain and swelling.  This has persisted despite various conservative measures.  By MRI scan, she has a small medial meniscus tear.  She has pain, which limits her ability to do  things she would like to do and to rest and she is offered an arthroscopy.  Informed operative consent was obtained after discussion of the possible complications including, reaction to anesthesia and infection.  SUMMARY OF FINDINGS AND PROCEDURE:  Under general anesthesia, a left knee arthroscopy was performed.  Suprapatellar pouch was benign as was the patellofemoral joint.  Medial compartment exhibited a small posterior horn medial meniscus tear addressed with  about a 5% partial medial meniscectomy.  There were no degenerative changes in this compartment.  ACL looked normal and the lateral compartment was completely benign.  She was scheduled to go home same day.  DESCRIPTION OF PROCEDURE:  The patient was taken to the operating suite where general anesthetic was applied without difficulty.  She was positioned supine and prepped and draped in normal sterile fashion.  After the administration of preoperative IV  Kefzol and appropriate time-out an arthroscopy of the left knee was performed through a total of 2 portals.  Findings were as noted above and the procedure consisted of the partial medial meniscectomy done with a basket and shaver, removing no more  than  5% of the medial meniscus.  Otherwise, her knee looked great.  The knee was then thoroughly irrigated, followed by removal of arthroscopic equipment.  We placed Adaptic over the 2 portals followed by dry gauze and a loose Ace wrap.  Estimated blood loss  and intraoperative fluids can be obtained from the anesthesia records.  No tourniquet was used.  DISPOSITION:  The patient was extubated in the operating room and taken to recovery room in stable condition.  Plans were for her to go home the same day and to follow up in the office in less than 1 week.   PUS D: 11/26/2020 2:12:27 pm T: 11/26/2020 3:03:00 pm  JOB: [id number]/ [id number]

## 2020-11-26 NOTE — Transfer of Care (Signed)
Immediate Anesthesia Transfer of Care Note  Patient: Phyllis Morgan  Procedure(s) Performed: Procedure(s): LEFT KNEE ARTHROSCOPY, PARTIAL MEDIAL MENISECTOMY (Left)  Patient Location: PACU  Anesthesia Type:General  Level of Consciousness: Alert, Awake, Oriented  Airway & Oxygen Therapy: Patient Spontanous Breathing  Post-op Assessment: Report given to RN  Post vital signs: Reviewed and stable  Last Vitals:  Vitals:   11/26/20 1157  BP: (!) 133/58  Pulse: 73  Resp: 16  Temp: 36.5 C  SpO2: 43%    Complications: No apparent anesthesia complications

## 2020-11-26 NOTE — Anesthesia Procedure Notes (Signed)
Procedure Name: LMA Insertion Date/Time: 11/26/2020 1:38 PM Performed by: Gerald Leitz, CRNA Pre-anesthesia Checklist: Patient identified, Patient being monitored, Timeout performed, Emergency Drugs available and Suction available Patient Re-evaluated:Patient Re-evaluated prior to induction Oxygen Delivery Method: Circle system utilized Preoxygenation: Pre-oxygenation with 100% oxygen Induction Type: IV induction Ventilation: Mask ventilation without difficulty LMA: LMA inserted LMA Size: 4.0 Tube type: Oral Number of attempts: 1 Placement Confirmation: positive ETCO2 and breath sounds checked- equal and bilateral Tube secured with: Tape Dental Injury: Teeth and Oropharynx as per pre-operative assessment

## 2020-11-27 NOTE — Progress Notes (Signed)
Office Visit Note  Patient: Phyllis Morgan             Date of Birth: [birth year]           MRN: [id number]             PCP: Marrian Salvage, [place] Referring: Marrian Salvage,* Visit Date: 12/11/2020 Occupation: @GUAROCC @  Subjective:  Positive ANA and joint pain.   History of Present Illness: Phyllis Morgan is a 60 y.o. female seen in consultation per request of her PCP.  According to the patient and June 2022 she started having sharp right rib cage pain.  She was seen by her chiropractor and the symptoms eventually resolved.  She also was experiencing pain in her left thumb which continues.  She developed left index trigger finger which lasted for few weeks and then resolved.  At the same time she was having pain and discomfort in her bilateral knee joints.  She states she was favoring her left knee as it was swollen which caused pain and discomfort in her right knee.  She was seen by Dr. Rhona Raider he did MRI of her left knee joint which showed meniscal tear.  She was also experiencing some discomfort in the left shoulder joint which eventually resolved.  She underwent left knee joint arthroscopic surgery on November 26, 2020 and had good results from the surgery.  Prior to her surgery on November 02, 2020 she developed COVID-19 infection which did not require any treatment.  Although prior to her surgery she also had a chest x-ray which showed shadow in her left lung.  She states postoperatively she started experiencing chest pain on the left side.  She was seen by [facility] and had extensive work-up.  She also had bronchoscopy with lavage.  According the patient yesterday she was told that she had some malignant cells.  She has an appointment with the oncologist tomorrow to get CT scan of the chest.  She also gives history of tick bite and was treated with doxycycline for about 20 days but her test was negative.  During her initial work-up her ANA was positive at 1: 40 and she was referred to  me for further evaluation.  Activities of Daily Living:  Patient reports morning stiffness for 30 minutes.   Patient Reports nocturnal pain.  Difficulty dressing/grooming: Denies Difficulty climbing stairs: Denies Difficulty getting out of chair: Denies Difficulty using hands for taps, buttons, cutlery, and/or writing: Denies  Review of Systems  Constitutional:  Negative for fatigue.  HENT:  Negative for mouth sores, mouth dryness and nose dryness.   Eyes:  Negative for pain, itching and dryness.  Respiratory:  Positive for shortness of breath, wheezing and difficulty breathing.   Cardiovascular:  Negative for chest pain and palpitations.  Gastrointestinal:  Negative for blood in stool, constipation and diarrhea.  Endocrine: Negative for increased urination.  Genitourinary:  Negative for difficulty urinating.  Musculoskeletal:  Positive for myalgias, morning stiffness and myalgias. Negative for joint pain, joint pain, joint swelling and muscle tenderness.  Skin:  Negative for color change, rash, redness and sensitivity to sunlight.  Allergic/Immunologic: Negative for susceptible to infections.  Neurological:  Negative for dizziness, numbness, headaches, memory loss and weakness.  Hematological:  Positive for bruising/bleeding tendency. Negative for swollen glands.  Psychiatric/Behavioral:  Negative for depressed mood and sleep disturbance. The patient is not nervous/anxious.    PMFS History:  Patient Active Problem List   Diagnosis Date Noted   Pulmonary nodules/lesions,  multiple 12/09/2020   Pneumonia due to COVID-19 virus 12/02/2020   Pleuritic pain 12/02/2020   Preop pulmonary/respiratory exam 11/14/2020   Sinusitis, bacterial 11/04/2020   Mixed hyperlipidemia 10/04/2020   Family history of MI (myocardial infarction) 10/04/2020   Solitary pulmonary nodule 06/04/2020   PVC (premature ventricular contraction) 01/12/2020   Family history of abdominal aortic aneurysm 01/12/2020    Chronic bronchitis, unspecified chronic bronchitis type ([place]) 01/03/2020   Esophageal reflux 07/03/2015   Celiac sprue 07/03/2015   Seasonal allergies    Celiac disease 02/03/2011    Past Medical History:  Diagnosis Date   Asthma    exercise induced   Biliary dyskinesia    Celiac disease    Celiac disease 2013   Colon polyps    Dyspnea    Dysrhythmia    PVCs   Gastric polyps    GERD (gastroesophageal reflux disease)    Headache    History of kidney stones    IDA (iron deficiency anemia)    Osteopenia 11/2018   T score -1.3 FRAX 6.5% / 0.4% stable from prior DEXA   Pneumonia    as a child   PONV (postoperative nausea and vomiting)    Seasonal allergies     Family History  Problem Relation Age of Onset   Hypertension Mother    Heart disease Mother    Cancer Mother        SKIN CANCER   Celiac disease Mother    Hypertension Father    Cancer Father        COLON   Heart disease Maternal Grandmother    Cancer Paternal Grandmother        Female cancer  ?type   Healthy Daughter    Colon cancer Neg Hx    Esophageal cancer Neg Hx    Rectal cancer Neg Hx    Stomach cancer Neg Hx    Past Surgical History:  Procedure Laterality Date   BRONCHIAL BIOPSY  12/09/2020   Procedure: BRONCHIAL BIOPSIES;  Surgeon: Collene Gobble, MD;  Location: [facility];  Service: Pulmonary;;   BRONCHIAL BRUSHINGS  12/09/2020   Procedure: BRONCHIAL BRUSHINGS;  Surgeon: Collene Gobble, MD;  Location: [facility] ENDOSCOPY;  Service: Pulmonary;;   BRONCHIAL NEEDLE ASPIRATION BIOPSY  12/09/2020   Procedure: BRONCHIAL NEEDLE ASPIRATION BIOPSIES;  Surgeon: Collene Gobble, MD;  Location: [facility];  Service: Pulmonary;;   BRONCHIAL WASHINGS  12/09/2020   Procedure: BRONCHIAL WASHINGS;  Surgeon: Collene Gobble, MD;  Location: [facility];  Service: Pulmonary;;   CESAREAN SECTION     CHOLECYSTECTOMY  2007   ENDOMETRIAL ABLATION  12/21/2006   HER OPTION CRYOABLATION    HAND SURGERY      THUMB/RIGHT HAND   KNEE ARTHROSCOPY Left 11/26/2020   Procedure: LEFT KNEE ARTHROSCOPY, PARTIAL MEDIAL MENISECTOMY;  Surgeon: Melrose Nakayama, MD;  Location: [facility];  Service: Orthopedics;  Laterality: Left;   KNEE SURGERY Right 2019   Left eye retinal tear  12/18/2018   [facility]   OVARIAN CYST REMOVAL Right 1993   Rt Ovarian Cystectomy   PELVIC LAPAROSCOPY     OVARIAN CYSTECTOMY   TUBAL LIGATION  12/2002   FALLOPE RINGS   UMBILICAL HERNIA REPAIR     VIDEO BRONCHOSCOPY WITH ENDOBRONCHIAL NAVIGATION N/A 12/09/2020   Procedure: VIDEO BRONCHOSCOPY WITH ENDOBRONCHIAL NAVIGATION;  Surgeon: Collene Gobble, MD;  Location: [facility];  Service: Pulmonary;  Laterality: N/A;   Social History   Social History Narrative   Not  on file   Immunization History  Administered Date(s) Administered   Influenza-Unspecified 01/02/2017, 11/17/2019   Moderna Sars-Covid-2 Vaccination 04/05/2019, 05/03/2019, 12/07/2019   PFIZER(Purple Top)SARS-COV-2 Vaccination 12/07/2019, 07/07/2020   Tdap 07/04/2018     Objective: Vital Signs: BP 119/77 (BP Location: Right Arm, Patient Position: Sitting, Cuff Size: Normal)   Pulse 71   Ht 5' 4"  (1.626 m)   Wt 171 lb 6.4 oz (77.7 kg)   LMP 10/10/2011   BMI 29.42 kg/m    Physical Exam Vitals and nursing note reviewed.  Constitutional:      Appearance: She is well-developed.  HENT:     Head: Normocephalic and atraumatic.  Eyes:     Conjunctiva/sclera: Conjunctivae normal.  Cardiovascular:     Rate and Rhythm: Normal rate and regular rhythm.     Heart sounds: Normal heart sounds.  Pulmonary:     Effort: Pulmonary effort is normal.     Breath sounds: Normal breath sounds.  Abdominal:     General: Bowel sounds are normal.     Palpations: Abdomen is soft.  Musculoskeletal:     Cervical back: Normal range of motion.  Lymphadenopathy:     Cervical: No cervical adenopathy.  Skin:    General: Skin is warm and dry.     Capillary Refill:  Capillary refill takes less than 2 seconds.  Neurological:     Mental Status: She is alert and oriented to person, place, and time.  Psychiatric:        Behavior: Behavior normal.     Musculoskeletal Exam: C-spine, thoracic and lumbar spine with good range of motion.  Shoulder joints, elbow joints, wrist joints, MCPs PIPs and DIPs with good range of motion.  She has some discomfort with abduction of her right shoulder joint over the subacromial region.  Some tenderness was noted over left CMC joint.  No synovitis was noted.  She had subluxation of her right first PIP joint due to previous injury.  Hip joints with good range of motion.  Right knee joint had some warmth and swelling due to recent meniscal tear repair.  Right knee joint was in good range of motion without any warmth swelling or effusion.  There was no tenderness over ankles or MTPs.  CDAI Exam: CDAI Score: -- Patient Global: --; Provider Global: -- Swollen: --; Tender: -- Joint Exam 12/11/2020   No joint exam has been documented for this visit   There is currently no information documented on the homunculus. Go to the Rheumatology activity and complete the homunculus joint exam.  Investigation: No additional findings.  Imaging: DG Chest 2 View  Addendum Date: 12/02/2020   ADDENDUM REPORT: 12/02/2020 12:54 ADDENDUM: Additionally, there are separate discrete nodular foci seen in the LEFT lower lobe up to the 13 mm and in the RIGHT lower lobe of to 4 mm. These results will be called to the ordering clinician or representative by the Radiologist Assistant, and communication documented in the PACS or [employer]. Electronically Signed   By: Zetta Bills M.D.   On: 12/02/2020 12:54   Result Date: 12/02/2020 CLINICAL DATA:  A 60 year old female presents for pneumonia follow-up. EXAM: CHEST - 2 VIEW COMPARISON:  November 14, 2020. FINDINGS: Persistent opacities in the RIGHT mid chest in the RIGHT middle lobe and in the LEFT  lung base. The show more pronounced convex margins than on the previous study. At the LEFT lung base measuring up to 3.7 cm and in the RIGHT middle lobe up to 2.8  cm. Pulmonary neoplasm or metastatic disease would be difficult to exclude given this appearance. CT of the chest is suggested for further evaluation. On limited assessment there is no acute skeletal process. Cardiomediastinal contours and hilar structures are unchanged. No lobar consolidation or effusion. IMPRESSION: Ovoid nodular and masslike findings in the chest with more pronounced, defined appearance raising the question of pulmonary neoplasm. CT of the chest is suggested for further evaluation. Electronically Signed: By: Zetta Bills M.D. On: 12/02/2020 12:49   DG Chest 2 View  Result Date: 11/14/2020 CLINICAL DATA:  Preoperative clearance EXAM: CHEST - 2 VIEW COMPARISON:  2021 FINDINGS: There is a consolidative opacity within the right middle lobe. Additional rounded opacity at the left lung base. No pleural effusion. Normal heart size. No acute osseous abnormality. IMPRESSION: Right middle lobe consolidative opacity. Additional rounded opacity at the left lung base. May reflect pneumonia in the appropriate setting. Recommend follow-up imaging to ensure resolution. Electronically Signed   By: Macy Mis M.D.   On: 11/14/2020 11:19   CT Angio Chest Pulmonary Embolism (PE) W or WO Contrast  Result Date: 12/02/2020 CLINICAL DATA:  Elevated D-dimer EXAM: CT ANGIOGRAPHY CHEST WITH CONTRAST TECHNIQUE: Multidetector CT imaging of the chest was performed using the standard protocol during bolus administration of intravenous contrast. Multiplanar CT image reconstructions and MIPs were obtained to evaluate the vascular anatomy. CONTRAST:  152m OMNIPAQUE IOHEXOL 350 MG/ML SOLN COMPARISON:  04/02/2020 FINDINGS: Cardiovascular: No filling defects in the pulmonary arteries to suggest pulmonary emboli. Heart is normal size. Aorta is normal  caliber. Mediastinum/Nodes: No mediastinal, hilar, or axillary adenopathy. Trachea and esophagus are unremarkable. Thyroid unremarkable. Lungs/Pleura: Bilateral nodular densities seen. The largest on the left is in the lower lobe measuring 3.5 cm. The largest on the right is in the right middle lobe measuring 2.5 cm. Some areas contain air bronchograms or areas of cavitation. Others appear to have vessels running through them. No effusions. Upper Abdomen: Imaging into the upper abdomen demonstrates no acute findings. Musculoskeletal: Chest wall soft tissues are unremarkable. No acute bony abnormality. Review of the MIP images confirms the above findings. IMPRESSION: No evidence of pulmonary embolus. Bilateral nodular opacities in both lungs measuring up to 3.5 cm. Given their appearance, favor septic emboli or other inflammatory/infectious process. However, follow-up is recommended after treatment to confirm resolution and to exclude metastases. Electronically Signed   By: KRolm BaptiseM.D.   On: 12/02/2020 18:37   DG Chest Port 1 View  Result Date: 12/09/2020 CLINICAL DATA:  Status post bronchoscopy. EXAM: PORTABLE CHEST 1 VIEW COMPARISON:  12/02/2020 FINDINGS: No evidence for pneumothorax or pleural effusion. Nodular density seen at the lung bases previously, left greater than right, are similar in the interval with subtle nodular opacity in the suprahilar right lung. The cardiopericardial silhouette is within normal limits for size. The visualized bony structures of the thorax show no acute abnormality. Telemetry leads overlie the chest. IMPRESSION: 1. No pneumothorax or pleural effusion. 2. Stable bilateral pulmonary nodules. Electronically Signed   By: EMisty StanleyM.D.   On: 12/09/2020 14:09   DG C-Arm 1-60 Min-No Report  Result Date: 12/09/2020 Fluoroscopy was utilized by the requesting physician.  No radiographic interpretation.   ECHOCARDIOGRAM COMPLETE  Result Date: 12/03/2020     ECHOCARDIOGRAM REPORT   Patient Name:   Phyllis RUMBERGERDate of Exam: 12/03/2020 Medical Rec #:  [id number]      Height:       64.0 in Accession #:  [id number]      Weight:       172.2 lb Date of Birth:  August 23, 1960        BSA:          1.836 m Patient Age:    29 years        BP:           100/68 mmHg Patient Gender: F               HR:           78 bpm. Exam Location:  [facility] Procedure: 2D Echo, 3D Echo, Cardiac Doppler, Color Doppler and Strain Analysis Indications:    r06.00 Dyspnea  History:        Patient has no prior history of Echocardiogram examinations.                 Signs/Symptoms:Shortness of Breath. History of COVID 19.  Sonographer:    Lenard Galloway BA, RDCS Referring Phys: Fonnie Mu PARRETT IMPRESSIONS  1. Left ventricular ejection fraction, by estimation, is 60 to 65%. The left ventricle has normal function. The left ventricle has no regional wall motion abnormalities. Left ventricular diastolic parameters were normal. The average left ventricular global longitudinal strain is -25.7 %. The global longitudinal strain is normal.  2. Right ventricular systolic function is normal. The right ventricular size is normal. Tricuspid regurgitation signal is inadequate for assessing PA pressure.  3. The mitral valve is normal in structure. No evidence of mitral valve regurgitation. No evidence of mitral stenosis.  4. The aortic valve is tricuspid. Aortic valve regurgitation is not visualized. No aortic stenosis is present.  5. The inferior vena cava is normal in size with greater than 50% respiratory variability, suggesting right atrial pressure of 3 mmHg. FINDINGS  Left Ventricle: Left ventricular ejection fraction, by estimation, is 60 to 65%. The left ventricle has normal function. The left ventricle has no regional wall motion abnormalities. The average left ventricular global longitudinal strain is -25.7 %. The global longitudinal strain is normal. The left ventricular internal cavity size was normal  in size. There is no left ventricular hypertrophy. Left ventricular diastolic parameters were normal. Right Ventricle: The right ventricular size is normal. No increase in right ventricular wall thickness. Right ventricular systolic function is normal. Tricuspid regurgitation signal is inadequate for assessing PA pressure. Left Atrium: Left atrial size was normal in size. Right Atrium: Right atrial size was normal in size. Pericardium: There is no evidence of pericardial effusion. Mitral Valve: The mitral valve is normal in structure. No evidence of mitral valve regurgitation. No evidence of mitral valve stenosis. Tricuspid Valve: The tricuspid valve is normal in structure. Tricuspid valve regurgitation is trivial. Aortic Valve: The aortic valve is tricuspid. Aortic valve regurgitation is not visualized. No aortic stenosis is present. Pulmonic Valve: The pulmonic valve was normal in structure. Pulmonic valve regurgitation is not visualized. Aorta: The aortic root is normal in size and structure. Venous: The inferior vena cava is normal in size with greater than 50% respiratory variability, suggesting right atrial pressure of 3 mmHg. IAS/Shunts: No atrial level shunt detected by color flow Doppler.  LEFT VENTRICLE PLAX 2D LVIDd:         3.80 cm   Diastology LVIDs:         2.00 cm   LV e' medial:    9.46 cm/s LV PW:         0.70 cm   LV E/e' medial:  9.1  LV IVS:        0.60 cm   LV e' lateral:   15.40 cm/s LVOT diam:     1.90 cm   LV E/e' lateral: 5.6 LV SV:         73 LV SV Index:   40        2D Longitudinal Strain LVOT Area:     2.84 cm  2D Strain GLS (A2C):   -27.7 %                          2D Strain GLS (A3C):   -24.5 %                          2D Strain GLS (A4C):   -24.9 %                          2D Strain GLS Avg:     -25.7 %                           3D Volume EF:                          3D EF:        61 %                          LV EDV:       90 ml                          LV ESV:       35 ml                           LV SV:        55 ml RIGHT VENTRICLE             IVC RV Basal diam:  3.10 cm     IVC diam: 1.60 cm RV Mid diam:    2.30 cm RV S prime:     13.20 cm/s TAPSE (M-mode): 2.4 cm RVSP:           12.4 mmHg LEFT ATRIUM         Index       RIGHT ATRIUM           Index LA diam:    3.00 cm 1.63 cm/m  RA Pressure: 3.00 mmHg                                 RA Area:     8.13 cm                                 RA Volume:   13.90 ml  7.57 ml/m  AORTIC VALVE LVOT Vmax:   121.00 cm/s LVOT Vmean:  80.900 cm/s LVOT VTI:    0.258 m  AORTA Ao Root diam: 2.90 cm Ao Asc diam:  2.90 cm MITRAL VALVE               TRICUSPID VALVE MV Area (PHT):  TR Peak grad:   9.4 mmHg MV Decel Time: 195 msec    TR Vmax:        153.00 cm/s MV E velocity: 85.90 cm/s  Estimated RAP:  3.00 mmHg MV A velocity: 87.50 cm/s  RVSP:           12.4 mmHg MV E/A ratio:  0.98                            SHUNTS                            Systemic VTI:  0.26 m                            Systemic Diam: 1.90 cm Dalton McleanMD Electronically signed by Franki Monte Signature Date/Time: 12/03/2020/5:01:16 PM    Final    DG C-ARM BRONCHOSCOPY  Result Date: 12/09/2020 C-ARM BRONCHOSCOPY: Fluoroscopy was utilized by the requesting physician.  No radiographic interpretation.    Recent Labs: Lab Results  Component Value Date   WBC 4.8 11/20/2020   HGB 12.7 11/20/2020   PLT 241 11/20/2020   NA 140 11/20/2020   K 3.9 11/20/2020   CL 108 11/20/2020   CO2 27 11/20/2020   GLUCOSE 109 (H) 11/20/2020   BUN 17 11/20/2020   CREATININE 0.87 11/20/2020   BILITOT 0.5 07/30/2020   ALKPHOS 63 07/30/2020   AST 23 07/30/2020   ALT 15 07/30/2020   PROT 6.5 07/30/2020   ALBUMIN 4.0 07/30/2020   CALCIUM 9.1 11/20/2020      Speciality Comments: No specialty comments available.  Procedures:  No procedures performed Allergies: Avelox [moxifloxacin hcl in nacl], Other, and Sulfa antibiotics   Assessment / Plan:     Visit Diagnoses:  Polyarthralgia-patient started experiencing pain in multiple joints and June 2022.  She states she had right rib cage pain which resolved after going to the chiropractor.  She had left CMC discomfort which persists.  She also had left index trigger finger which eventually resolved.    Chronic left knee pain -she has been experiencing pain and discomfort in her knee joints.  She was under care of Dr. Rhona Raider.  She underwent left knee joint arthroscopic surgery on November 26, 2020.  She still has some warmth and swelling and discomfort in extension of her left knee joint.  She states that discomfort is gradually improving.  I reviewed most recent labs her sed rate was normal and anti-CCP was negative.  ENA panel was negative.  I advised her to contact me in case she has persistent swelling of her left knee joint or if she develops any other joint swelling.  Arthritis of the left CMC joint-she had tenderness on palpation over left CMC joint.  She states she has been having discomfort in the left [facility] joint since June 2022.  Joint protection was discussed.  I also discussed possible use of left CMC brace.  Chronic left shoulder pain-she had some tenderness over the subacromial region.  Shoulder joint exercises were advised.  Positive ANA (antinuclear antibody) - 07/30/20: ANA 1:40NS, TSH 1.69, B burgdoferi ab<0.9, vitamin B12 423, Uric acid 4.8, lyme ab negative.  She was found to have positive ANA in June.  She had recent work-up by pulmonologist and ENA panel was negative. December 03, 2020 CCP negative, sed rate 13, SSA antibody negative  SSB antibody negative, dsDNA negative, RNP negative, SCL 70 negative, ANCA negative, MPO negative, serine proteinase 3 negative She denies any history of oral ulcers, nasal ulcers, sicca symptoms, Raynaud's, malar rash, photosensitivity or lymphadenopathy.  ANA titer was low and not significant.  Advised her to contact me in case she develops any new symptoms.  Celiac  disease-patient states that her disease is very well controlled with gluten-free diet.  Pulmonary nodule-she was found to have pulmonary nodules on the chest x-ray prior to her knee joint surgery.  She was evaluated by a pulmonologist.  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  She has an appointment coming up with Dr. Earlie Server and a PET scan is scheduled.  History of chronic bronchitis-she gives history of chronic cough.  History of gastroesophageal reflux (GERD)  PVC (premature ventricular contraction)  Mixed hyperlipidemia  Family history of MI (myocardial infarction)  Family history of abdominal aortic aneurysm  COVID-19 virus infection-November 02, 2020.  Patient states the symptoms resolved without complications or treatment.  Orders: No orders of the defined types were placed in this encounter.  No orders of the defined types were placed in this encounter.    Follow-Up Instructions: Return if symptoms worsen or fail to improve, for Positive ANA.   Bo Merino, MD  Note - This record has been created using Editor, commissioning.  Chart creation errors have been sought, but may not always  have been located. Such creation errors do not reflect on  the standard of medical care.

## 2020-11-28 NOTE — Anesthesia Postprocedure Evaluation (Signed)
Anesthesia Post Note  Patient: Phyllis Morgan  Procedure(s) Performed: LEFT KNEE ARTHROSCOPY, PARTIAL MEDIAL MENISECTOMY (Left: Knee)     Patient location during evaluation: PACU Anesthesia Type: General Level of consciousness: awake and alert Pain management: pain level controlled Vital Signs Assessment: post-procedure vital signs reviewed and stable Respiratory status: spontaneous breathing, nonlabored ventilation, respiratory function stable and patient connected to nasal cannula oxygen Cardiovascular status: blood pressure returned to baseline and stable Postop Assessment: no apparent nausea or vomiting Anesthetic complications: no   No notable events documented.  Last Vitals:  Vitals:   11/26/20 1515 11/26/20 1533  BP: 121/71 (!) 102/52  Pulse: 84 77  Resp: 14 13  Temp:    SpO2: 97% 98%    Last Pain:  Vitals:   11/26/20 1533  TempSrc:   PainSc: 0-No pain                 Belenda Cruise P Dennys Guin

## 2020-11-29 ENCOUNTER — Encounter (HOSPITAL_COMMUNITY): Payer: Self-pay | Admitting: Orthopaedic Surgery

## 2020-12-02 ENCOUNTER — Encounter: Payer: Self-pay | Admitting: Adult Health

## 2020-12-02 ENCOUNTER — Ambulatory Visit (HOSPITAL_BASED_OUTPATIENT_CLINIC_OR_DEPARTMENT_OTHER)
Admission: RE | Admit: 2020-12-02 | Discharge: 2020-12-02 | Disposition: A | Discharge status: Home / Self Care | Payer: BC Managed Care – PPO | Source: Ambulatory Visit | Attending: Adult Health | Admitting: Adult Health

## 2020-12-02 ENCOUNTER — Other Ambulatory Visit: Payer: Self-pay | Admitting: *Deleted

## 2020-12-02 ENCOUNTER — Ambulatory Visit (INDEPENDENT_AMBULATORY_CARE_PROVIDER_SITE_OTHER): Payer: BC Managed Care – PPO

## 2020-12-02 ENCOUNTER — Ambulatory Visit: Payer: BC Managed Care – PPO | Admitting: Rheumatology

## 2020-12-02 ENCOUNTER — Other Ambulatory Visit: Payer: Self-pay

## 2020-12-02 ENCOUNTER — Ambulatory Visit (INDEPENDENT_AMBULATORY_CARE_PROVIDER_SITE_OTHER): Payer: BC Managed Care – PPO | Admitting: Adult Health

## 2020-12-02 ENCOUNTER — Telehealth: Payer: Self-pay | Admitting: Adult Health

## 2020-12-02 ENCOUNTER — Telehealth: Payer: Self-pay | Admitting: *Deleted

## 2020-12-02 VITALS — BP 100/68 | HR 83 | Temp 98.0°F | Ht 64.0 in | Wt 172.2 lb

## 2020-12-02 DIAGNOSIS — J1282 Pneumonia due to coronavirus disease 2019: Secondary | ICD-10-CM | POA: Diagnosis not present

## 2020-12-02 DIAGNOSIS — R7989 Other specified abnormal findings of blood chemistry: Secondary | ICD-10-CM | POA: Diagnosis present

## 2020-12-02 DIAGNOSIS — R0781 Pleurodynia: Secondary | ICD-10-CM | POA: Diagnosis not present

## 2020-12-02 DIAGNOSIS — R0602 Shortness of breath: Secondary | ICD-10-CM

## 2020-12-02 DIAGNOSIS — U071 COVID-19: Secondary | ICD-10-CM | POA: Diagnosis not present

## 2020-12-02 LAB — D-DIMER, QUANTITATIVE: D-Dimer, Quant: 1.55 mcg/mL FEU — ABNORMAL HIGH (ref <0.50)

## 2020-12-02 MED ORDER — IOHEXOL 350 MG/ML SOLN
100.0000 mL | Freq: Once | INTRAVENOUS | Status: AC | PRN
  Administered 2020-12-02: 100 mL via INTRAVENOUS

## 2020-12-02 NOTE — Progress Notes (Signed)
Rexene Edison NP spoke with patient, provided results/recommendations.  Patient scheduled for stat CTA and is on way to have scan completed.

## 2020-12-02 NOTE — Progress Notes (Signed)
@Patient  ID: Phyllis Morgan, female    DOB: Mar 19, 1960, 59 y.o.   MRN: [id number]  Chief Complaint  Patient presents with   Follow-up    Referring provider: Marrian Salvage,*  HPI: 60 year old female never smoker seen for pulmonary consult Jun 04, 2020 for pulmonary nodule and chronic bronchitis  TEST/EVENTS :  Cardiac CT chest April 02, 2020 4 mm subpleural nodule in the right upper lobe.  Likely a benign subpleural lymph node.  12/02/2020 Follow up ; Chronic Bronchitis and Pneumonia  Patient returns for 2-week follow-up.  Patient was seen last visit for a preop pulmonary risk assessment for upcoming knee surgery.  Patient had had COVID-19 infection 2 weeks prior to last office visit.  She had cold-like symptoms.  I completed a course of antibiotics for secondary sinus infection and bronchitis.  Preop chest x-ray showed right middle lobe consolidation and opacity in the left lower lung.  Patient had completed a 10-day course of antibiotics.  She was recommended to continue on mucociliary clearance regimen. Since last visit patient is feeling well with no cough or congestion. Does complain that 3 days ago developed left sided pleuritic pain on inspiration . No hemoptysis or fever. Pain is only in inspiration .  Patient did undergo her knee surgery on November 26, 2020.  Says she is doing well since surgery . No calf pain or swelling .  No history of VTE. Father had PE post op.    Allergies  Allergen Reactions   Avelox [Moxifloxacin Hcl In Nacl] Nausea Only   Other     PT has Celiac disease    Sulfa Antibiotics Other (See Comments)    Blurred vision    Immunization History  Administered Date(s) Administered   Influenza-Unspecified 01/02/2017, 11/17/2019   Moderna Sars-Covid-2 Vaccination 04/05/2019, 05/03/2019, 12/07/2019   PFIZER(Purple Top)SARS-COV-2 Vaccination 04/05/2019, 05/03/2019, 12/07/2019, 07/07/2020   Tdap 07/04/2018    Past Medical History:  Diagnosis Date    Asthma    exercise induced   Biliary dyskinesia    Celiac disease    Celiac disease 2013   Colon polyps    Gastric polyps    GERD (gastroesophageal reflux disease)    Headache    History of kidney stones    IDA (iron deficiency anemia)    Osteopenia 11/2018   T score -1.3 FRAX 6.5% / 0.4% stable from prior DEXA   Pneumonia    as a child   PONV (postoperative nausea and vomiting)    Seasonal allergies     Tobacco History: Social History   Tobacco Use  Smoking Status Never  Smokeless Tobacco Never   Counseling given: Not Answered   Outpatient Medications Prior to Visit  Medication Sig Dispense Refill   acetaminophen (TYLENOL) 500 MG tablet Take 1,000 mg by mouth every 6 (six) hours as needed (for pain/headaches.).     albuterol (VENTOLIN HFA) 108 (90 Base) MCG/ACT inhaler INHALE 2 PUFFS INTO THE LUNGS EVERY 6 HOURS AS NEEDED FOR WHEEZING OR SHORTNESS OF BREATH 54 g 0   cetirizine (ZYRTEC) 10 MG tablet Take 10 mg by mouth daily.     cholecalciferol (VITAMIN D) 25 MCG (1000 UNIT) tablet Take 1,000 Units by mouth daily.     dexlansoprazole (DEXILANT) 60 MG capsule TAKE 1 CAPSULE BY MOUTH EVERY MORNING 90 capsule 4   estradiol (VIVELLE-DOT) 0.1 MG/24HR patch Place one patch onto the skin twice weekly. Pls fill with Novan generic. (Patient taking differently: Place 1 patch onto the skin  2 (two) times a week. (Wednesdays & Saturdays) Place one patch onto the skin twice weekly. Pls fill with Novan generic.) 24 patch 3   fluticasone (FLONASE) 50 MCG/ACT nasal spray Place 2 sprays into the nose in the morning.     guaifenesin (HUMIBID E) 400 MG TABS tablet Take 400 mg by mouth daily.     Multiple Vitamin (MULTIVITAMIN WITH MINERALS) TABS tablet Take 1 tablet by mouth daily.     phenylephrine (SUDAFED PE) 10 MG TABS tablet Take 10 mg by mouth every 4 (four) hours as needed (sinus congestion.).     progesterone (PROMETRIUM) 200 MG capsule Take 1 capsule (200 mg total) by mouth at bedtime.  90 capsule 3   traMADol (ULTRAM) 50 MG tablet Take 1-2 tablets (50-100 mg total) by mouth every 6 (six) hours as needed for moderate pain or severe pain (post op pain). (Patient not taking: Reported on 12/02/2020) 10 tablet 0   No facility-administered medications prior to visit.     Review of Systems:   Constitutional:   No  weight loss, night sweats,  Fevers, chills, fatigue, or  lassitude.  HEENT:   No headaches,  Difficulty swallowing,  Tooth/dental problems, or  Sore throat,                No sneezing, itching, ear ache, nasal congestion, post nasal drip,   CV:  No chest pain,  Orthopnea, PND, swelling in lower extremities, anasarca, dizziness, palpitations, syncope.   GI  No heartburn, indigestion, abdominal pain, nausea, vomiting, diarrhea, change in bowel habits, loss of appetite, bloody stools.   Resp: No shortness of breath with exertion or at rest.  No excess mucus, no productive cough,  No non-productive cough,  No coughing up of blood.  No change in color of mucus.  No wheezing.  No chest wall deformity  Skin: no rash or lesions.  GU: no dysuria, change in color of urine, no urgency or frequency.  No flank pain, no hematuria   MS:  No joint pain or swelling.  No decreased range of motion.  No back pain.    Physical Exam  BP 100/68 (BP Location: Left Arm, Patient Position: Sitting, Cuff Size: Normal)   Pulse 83   Temp 98 F (36.7 C) (Oral)   Ht 5' 4"  (1.626 m)   Wt 172 lb 3.2 oz (78.1 kg)   LMP 10/10/2011   SpO2 99%   BMI 29.56 kg/m   GEN: A/Ox3; pleasant , NAD, well nourished    HEENT:  [place]/AT,  EACs-clear, TMs-wnl, NOSE-clear, THROAT-clear, no lesions, no postnasal drip or exudate noted.   NECK:  Supple w/ fair ROM; no JVD; normal carotid impulses w/o bruits; no thyromegaly or nodules palpated; no lymphadenopathy.    RESP  Clear  P & A; w/o, wheezes/ rales/ or rhonchi. no accessory muscle use, no dullness to percussion  CARD:  RRR, no m/r/g, no peripheral  edema, pulses intact, no cyanosis or clubbing.  GI:   Soft & nt; nml bowel sounds; no organomegaly or masses detected.   Musco: Warm bil, no deformities or joint swelling noted.   Neuro: alert, no focal deficits noted.    Skin: Warm, no lesions or rashes    Lab Results:  CBC    Component Value Date/Time   WBC 4.8 11/20/2020 1429   RBC 4.21 11/20/2020 1429   HGB 12.7 11/20/2020 1429   HCT 37.8 11/20/2020 1429   PLT 241 11/20/2020 1429   MCV 89.8  11/20/2020 1429   MCH 30.2 11/20/2020 1429   MCHC 33.6 11/20/2020 1429   RDW 12.5 11/20/2020 1429   LYMPHSABS 0.8 07/30/2020 1517   MONOABS 0.4 07/30/2020 1517   EOSABS 0.2 07/30/2020 1517   BASOSABS 0.1 07/30/2020 1517    BMET    Component Value Date/Time   NA 140 11/20/2020 1429   K 3.9 11/20/2020 1429   CL 108 11/20/2020 1429   CO2 27 11/20/2020 1429   GLUCOSE 109 (H) 11/20/2020 1429   BUN 17 11/20/2020 1429   CREATININE 0.87 11/20/2020 1429   CREATININE 0.94 08/15/2019 0940   CALCIUM 9.1 11/20/2020 1429   GFRNONAA >60 11/20/2020 1429    BNP No results found for: BNP  ProBNP No results found for: PROBNP  Imaging: DG Chest 2 View  Addendum Date: 12/02/2020   ADDENDUM REPORT: 12/02/2020 12:54 ADDENDUM: Additionally, there are separate discrete nodular foci seen in the LEFT lower lobe up to the 13 mm and in the RIGHT lower lobe of to 4 mm. These results will be called to the ordering clinician or representative by the Radiologist Assistant, and communication documented in the PACS or [employer]. Electronically Signed   By: Zetta Bills M.D.   On: 12/02/2020 12:54   Result Date: 12/02/2020 CLINICAL DATA:  A 60 year old female presents for pneumonia follow-up. EXAM: CHEST - 2 VIEW COMPARISON:  November 14, 2020. FINDINGS: Persistent opacities in the RIGHT mid chest in the RIGHT middle lobe and in the LEFT lung base. The show more pronounced convex margins than on the previous study. At the LEFT lung base  measuring up to 3.7 cm and in the RIGHT middle lobe up to 2.8 cm. Pulmonary neoplasm or metastatic disease would be difficult to exclude given this appearance. CT of the chest is suggested for further evaluation. On limited assessment there is no acute skeletal process. Cardiomediastinal contours and hilar structures are unchanged. No lobar consolidation or effusion. IMPRESSION: Ovoid nodular and masslike findings in the chest with more pronounced, defined appearance raising the question of pulmonary neoplasm. CT of the chest is suggested for further evaluation. Electronically Signed: By: Zetta Bills M.D. On: 12/02/2020 12:49   DG Chest 2 View  Result Date: 11/14/2020 CLINICAL DATA:  Preoperative clearance EXAM: CHEST - 2 VIEW COMPARISON:  2021 FINDINGS: There is a consolidative opacity within the right middle lobe. Additional rounded opacity at the left lung base. No pleural effusion. Normal heart size. No acute osseous abnormality. IMPRESSION: Right middle lobe consolidative opacity. Additional rounded opacity at the left lung base. May reflect pneumonia in the appropriate setting. Recommend follow-up imaging to ensure resolution. Electronically Signed   By: Macy Mis M.D.   On: 11/14/2020 11:19      No flowsheet data found.  No results found for: NITRICOXIDE      Assessment & Plan:   Pneumonia due to COVID-19 virus Bilateral Pneumonia with recent Covid 19 infection . Completed 10 day course of Augmentin  Clinically improved however with new onset pleuritic pain will need further evaluation  She is at risk for VTE with HRT, surgery and Cvoid -check D Dimer . CTa Chest if positive  Check chest xray today   Plan  Patient Instructions  Labs today .  Chest xray today  Warm heat to chest xray  Tylenol As needed   Albuterol Inhaler As needed   Follow up with Dr. Lamonte Sakai  as planned and As needed   Please contact office for sooner follow up if  symptoms do not improve or worsen or  seek emergency care       Pleuritic pain Left sided pleuritic pain x 3 days with inspiration only .  Check chest xray  Check D Dimer   Plan  Patient Instructions  Labs today .  Chest xray today  Warm heat to chest xray  Tylenol As needed   Albuterol Inhaler As needed   Follow up with Dr. Lamonte Sakai  as planned and As needed   Please contact office for sooner follow up if symptoms do not improve or worsen or seek emergency care         Rexene Edison, NP 12/02/2020

## 2020-12-02 NOTE — Assessment & Plan Note (Signed)
Bilateral Pneumonia with recent Covid 19 infection . Completed 10 day course of Augmentin  Clinically improved however with new onset pleuritic pain will need further evaluation  She is at risk for VTE with HRT, surgery and Cvoid -check D Dimer . CTa Chest if positive  Check chest xray today   Plan  Patient Instructions  Labs today .  Chest xray today  Warm heat to chest xray  Tylenol As needed   Albuterol Inhaler As needed   Follow up with Dr. Lamonte Sakai  as planned and As needed   Please contact office for sooner follow up if symptoms do not improve or worsen or seek emergency care

## 2020-12-02 NOTE — Patient Instructions (Signed)
Labs today .  Chest xray today  Warm heat to chest xray  Tylenol As needed   Albuterol Inhaler As needed   Follow up with Dr. Lamonte Sakai  as planned and As needed   Please contact office for sooner follow up if symptoms do not improve or worsen or seek emergency care

## 2020-12-02 NOTE — Telephone Encounter (Signed)
CT ordered. 

## 2020-12-02 NOTE — Telephone Encounter (Signed)
ATC patient x1, left VM to return call.  When she returns call,she needs to have a stat CTA due to elevated d-dimer to r/o blood clot.  The PCCs will call her to schedule.

## 2020-12-02 NOTE — Assessment & Plan Note (Signed)
Left sided pleuritic pain x 3 days with inspiration only .  Check chest xray  Check D Dimer   Plan  Patient Instructions  Labs today .  Chest xray today  Warm heat to chest xray  Tylenol As needed   Albuterol Inhaler As needed   Follow up with Dr. Lamonte Sakai  as planned and As needed   Please contact office for sooner follow up if symptoms do not improve or worsen or seek emergency care

## 2020-12-02 NOTE — Telephone Encounter (Signed)
I called [facility] radiology and let them know we had the report. Sending to the provider who ordered as an FYI.

## 2020-12-03 ENCOUNTER — Other Ambulatory Visit: Payer: Self-pay

## 2020-12-03 ENCOUNTER — Other Ambulatory Visit (INDEPENDENT_AMBULATORY_CARE_PROVIDER_SITE_OTHER): Payer: BC Managed Care – PPO

## 2020-12-03 ENCOUNTER — Ambulatory Visit (HOSPITAL_COMMUNITY): Payer: BC Managed Care – PPO | Attending: Cardiology

## 2020-12-03 ENCOUNTER — Other Ambulatory Visit: Payer: Self-pay | Admitting: Adult Health

## 2020-12-03 DIAGNOSIS — R918 Other nonspecific abnormal finding of lung field: Secondary | ICD-10-CM | POA: Diagnosis not present

## 2020-12-03 DIAGNOSIS — R0781 Pleurodynia: Secondary | ICD-10-CM

## 2020-12-03 DIAGNOSIS — R0602 Shortness of breath: Secondary | ICD-10-CM

## 2020-12-03 LAB — ECHOCARDIOGRAM COMPLETE
Area-P 1/2: 3.89 cm2
S' Lateral: 2 cm

## 2020-12-03 LAB — SEDIMENTATION RATE: Sed Rate: 13 mm/hr (ref 0–30)

## 2020-12-04 LAB — ANTI-DNA ANTIBODY, DOUBLE-STRANDED: ds DNA Ab: 1 IU/mL

## 2020-12-04 LAB — SJOGREN'S SYNDROME ANTIBODS(SSA + SSB)
SSA (Ro) (ENA) Antibody, IgG: <1 AI
SSB (La) (ENA) Antibody, IgG: <1 AI

## 2020-12-04 LAB — CYCLIC CITRUL PEPTIDE ANTIBODY, IGG: Cyclic Citrullin Peptide Ab: <16 UNITS

## 2020-12-04 LAB — ANCA SCREEN W REFLEX TITER: ANCA Screen: NEGATIVE

## 2020-12-04 LAB — ANTI-SCLERODERMA ANTIBODY: Scleroderma (Scl-70) (ENA) Antibody, IgG: <1 AI

## 2020-12-04 LAB — MPO/PR-3 (ANCA) ANTIBODIES
Myeloperoxidase Abs: <1 AI
Serine Protease 3: <1 AI

## 2020-12-05 ENCOUNTER — Telehealth: Payer: Self-pay | Admitting: Adult Health

## 2020-12-05 NOTE — Telephone Encounter (Signed)
Called and spoke with patient. Let them know their Bronch is scheduled for 12/09/20 with Dr. Lamonte Sakai at 11:30.  Patient was instructed to arrive at hospital at 9:30. They were instructed to bring someone with them as they will not be able to drive home from procedure. Patient instructed not to have anything to eat or drink after midnight.   Patient is not getting Covid test done because she tested positive on October 1. Dr. Lamonte Sakai is aware  Patient voiced understanding, nothing further needed  Routing to Dr. Lamonte Sakai as Juluis Rainier

## 2020-12-05 NOTE — Telephone Encounter (Signed)
Please schedule the following:  Provider performing procedure:Byrum  Diagnosis: Lung mass  Which side if for nodule / mass? >2 cm  Procedure: Navigational Bronchoscopy   Has patient been spoken to by Provider and given informed consent? Yes  Anesthesia: yes  Do you need Fluro?  Duration of procedure:  Date: 12/09/20  Alternate Date:   Time: AM/ PM Location:  Does patient have OSA? No  DM? NO  Or Latex allergy? No  Medication Restriction:  Anticoagulate/Antiplatelet: No  Pre-op Labs Ordered:determined by Anesthesia Imaging request:   (If, SuperDimension CT Chest, please have STAT courier sent to ENDO)  Please coordinate Pre-op COVID Testing

## 2020-12-05 NOTE — Progress Notes (Signed)
Lab work completed.  Called [facility] to have CTA converted to a super D.  Advised they would have it converted. Provider advised that the bronch would need to be sent to the procedure pool with the dot phrase with the specific procedure to be done and then it will be scheduled.  Nothing further needed.  Tammy, Please use dot phrase to send to procedure pool and put in specific procedure and date to be done and then it will be scheduled.

## 2020-12-06 ENCOUNTER — Encounter (HOSPITAL_COMMUNITY): Payer: Self-pay | Admitting: Emergency Medicine

## 2020-12-06 ENCOUNTER — Other Ambulatory Visit: Payer: Self-pay

## 2020-12-06 LAB — HYPERSENSITIVITY PNEUMONITIS
A. Pullulans Abs: NEGATIVE
A.Fumigatus #1 Abs: NEGATIVE
Micropolyspora faeni, IgG: NEGATIVE
Pigeon Serum Abs: NEGATIVE
Thermoact. Saccharii: NEGATIVE
Thermoactinomyces vulgaris, IgG: NEGATIVE

## 2020-12-06 LAB — RNP ANTIBODIES: ENA RNP Ab: 0.2 AI (ref 0.0–0.9)

## 2020-12-06 NOTE — Progress Notes (Addendum)
PCP - Jodi Mourning Cardiologist - Rudean Haskell  PPM/ICD - denies   Chest x-ray - 12/02/20 EKG - 01/12/20 Stress Test - denies ECHO - 12/03/20 Cardiac Cath - denies  CPAP - denies  No diabetes    ERAS Protcol - no  COVID TEST- tested positive for COVID 11/02/20? Not needed per dr byrum (see note from 12/05/20)  Anesthesia review: no  Patient verbally denies any shortness of breath, fever, cough and chest pain during phone call   -------------  SDW INSTRUCTIONS given:  Your procedure is scheduled on 12/09/20.  Report to [facility] Main Entrance "A" at 9:00 A.M., and check in at the Admitting office.  Call this number if you have problems the morning of surgery:  [phone]   Remember:  Do not eat or drink after midnight the night before your surgery      Take these medicines the morning of surgery with A SIP OF WATER  acetaminophen (TYLENOL) if needed albuterol (VENTOLIN HFA) if needed cetirizine (ZYRTEC)  dexlansoprazole (DEXILANT)  fluticasone (FLONASE) guaifenesin (HUMIBID E)  As of today, STOP taking any Aspirin (unless otherwise instructed by your surgeon) Aleve, Naproxen, Ibuprofen, Motrin, Advil, Goody's, BC's, all herbal medications, fish oil, and all vitamins.                      Do not wear jewelry, make up, or nail polish            Do not wear lotions, powders, perfumes/colognes, or deodorant.            Do not shave 48 hours prior to surgery.  Men may shave face and neck.            Do not bring valuables to the hospital.            [facility] is not responsible for any belongings or valuables.  Do NOT Smoke (Tobacco/Vaping) or drink Alcohol 24 hours prior to your procedure If you use a CPAP at night, you may bring all equipment for your overnight stay.   Contacts, glasses, dentures or bridgework may not be worn into surgery.      For patients admitted to the hospital, discharge time will be determined by your treatment team.    Patients discharged the day of surgery will not be allowed to drive home, and someone needs to stay with them for 24 hours.    Special instructions:   [place]- Preparing For Surgery  Before surgery, you can play an important role. Because skin is not sterile, your skin needs to be as free of germs as possible. You can reduce the number of germs on your skin by washing with CHG (chlorahexidine gluconate) Soap before surgery.  CHG is an antiseptic cleaner which kills germs and bonds with the skin to continue killing germs even after washing.    Oral Hygiene is also important to reduce your risk of infection.  Remember - BRUSH YOUR TEETH THE MORNING OF SURGERY WITH YOUR REGULAR TOOTHPASTE  Please do not use if you have an allergy to CHG or antibacterial soaps. If your skin becomes reddened/irritated stop using the CHG.  Do not shave (including legs and underarms) for at least 48 hours prior to first CHG shower. It is OK to shave your face.  Please follow these instructions carefully.   Shower the NIGHT BEFORE SURGERY and the MORNING OF SURGERY with DIAL Soap.   Pat yourself dry with a CLEAN TOWEL.  Wear CLEAN PAJAMAS to bed the night before surgery  Place CLEAN SHEETS on your bed the night of your first shower and DO NOT SLEEP WITH PETS.   Day of Surgery: Please shower morning of surgery  Wear Clean/Comfortable clothing the morning of surgery Do not apply any deodorants/lotions.   Remember to brush your teeth WITH YOUR REGULAR TOOTHPASTE.   Questions were answered. Patient verbalized understanding of instructions.

## 2020-12-09 ENCOUNTER — Encounter (HOSPITAL_COMMUNITY): Payer: Self-pay | Admitting: Emergency Medicine

## 2020-12-09 ENCOUNTER — Ambulatory Visit (HOSPITAL_COMMUNITY): Payer: BC Managed Care – PPO | Admitting: Anesthesiology

## 2020-12-09 ENCOUNTER — Ambulatory Visit (HOSPITAL_COMMUNITY)
Admission: RE | Admit: 2020-12-09 | Discharge: 2020-12-09 | Disposition: A | Discharge status: Home / Self Care | Payer: BC Managed Care – PPO | Source: Ambulatory Visit | Attending: Emergency Medicine | Admitting: Emergency Medicine

## 2020-12-09 ENCOUNTER — Ambulatory Visit (HOSPITAL_COMMUNITY): Payer: BC Managed Care – PPO

## 2020-12-09 ENCOUNTER — Other Ambulatory Visit: Payer: Self-pay

## 2020-12-09 ENCOUNTER — Encounter (HOSPITAL_COMMUNITY)
Admission: RE | Disposition: A | Discharge status: Home / Self Care | Payer: Self-pay | Source: Ambulatory Visit | Attending: Emergency Medicine

## 2020-12-09 DIAGNOSIS — C342 Malignant neoplasm of middle lobe, bronchus or lung: Secondary | ICD-10-CM | POA: Diagnosis not present

## 2020-12-09 DIAGNOSIS — J42 Unspecified chronic bronchitis: Secondary | ICD-10-CM | POA: Diagnosis not present

## 2020-12-09 DIAGNOSIS — R918 Other nonspecific abnormal finding of lung field: Secondary | ICD-10-CM | POA: Diagnosis present

## 2020-12-09 DIAGNOSIS — K219 Gastro-esophageal reflux disease without esophagitis: Secondary | ICD-10-CM | POA: Diagnosis not present

## 2020-12-09 DIAGNOSIS — J45909 Unspecified asthma, uncomplicated: Secondary | ICD-10-CM | POA: Diagnosis not present

## 2020-12-09 DIAGNOSIS — Z419 Encounter for procedure for purposes other than remedying health state, unspecified: Secondary | ICD-10-CM

## 2020-12-09 DIAGNOSIS — R911 Solitary pulmonary nodule: Secondary | ICD-10-CM | POA: Insufficient documentation

## 2020-12-09 DIAGNOSIS — Z8616 Personal history of COVID-19: Secondary | ICD-10-CM | POA: Insufficient documentation

## 2020-12-09 DIAGNOSIS — Z79890 Hormone replacement therapy (postmenopausal): Secondary | ICD-10-CM

## 2020-12-09 DIAGNOSIS — Z9889 Other specified postprocedural states: Secondary | ICD-10-CM

## 2020-12-09 HISTORY — DX: Dyspnea, unspecified: R06.00

## 2020-12-09 HISTORY — PX: VIDEO BRONCHOSCOPY WITH ENDOBRONCHIAL NAVIGATION: SHX6175

## 2020-12-09 HISTORY — PX: BRONCHIAL BRUSHINGS: SHX5108

## 2020-12-09 HISTORY — PX: BRONCHIAL BIOPSY: SHX5109

## 2020-12-09 HISTORY — PX: BRONCHIAL WASHINGS: SHX5105

## 2020-12-09 HISTORY — PX: BRONCHIAL NEEDLE ASPIRATION BIOPSY: SHX5106

## 2020-12-09 HISTORY — DX: Cardiac arrhythmia, unspecified: I49.9

## 2020-12-09 LAB — CULTURE, BLOOD (SINGLE)
MICRO NUMBER:: 12577891
MICRO NUMBER:: 12577894
Result:: NO GROWTH
SPECIMEN QUALITY:: ADEQUATE

## 2020-12-09 SURGERY — VIDEO BRONCHOSCOPY WITH ENDOBRONCHIAL NAVIGATION
Anesthesia: General

## 2020-12-09 MED ORDER — SUGAMMADEX SODIUM 200 MG/2ML IV SOLN
INTRAVENOUS | Status: DC | PRN
  Administered 2020-12-09: 200 mg via INTRAVENOUS

## 2020-12-09 MED ORDER — LIDOCAINE 2% (20 MG/ML) 5 ML SYRINGE
INTRAMUSCULAR | Status: DC | PRN
  Administered 2020-12-09: 60 mg via INTRAVENOUS

## 2020-12-09 MED ORDER — MIDAZOLAM HCL 2 MG/2ML IJ SOLN
INTRAMUSCULAR | Status: DC | PRN
  Administered 2020-12-09: 2 mg via INTRAVENOUS

## 2020-12-09 MED ORDER — ESTRADIOL 0.1 MG/24HR TD PTTW: 1.0000 | MEDICATED_PATCH | TRANSDERMAL | Status: DC

## 2020-12-09 MED ORDER — ONDANSETRON HCL 4 MG/2ML IJ SOLN
INTRAMUSCULAR | Status: DC | PRN
  Administered 2020-12-09: 4 mg via INTRAVENOUS

## 2020-12-09 MED ORDER — FENTANYL CITRATE (PF) 250 MCG/5ML IJ SOLN
INTRAMUSCULAR | Status: DC | PRN
  Administered 2020-12-09 (×2): 50 ug via INTRAVENOUS

## 2020-12-09 MED ORDER — PROPOFOL 10 MG/ML IV BOLUS
INTRAVENOUS | Status: DC | PRN
  Administered 2020-12-09: 120 mg via INTRAVENOUS

## 2020-12-09 MED ORDER — SCOPOLAMINE 1 MG/3DAYS TD PT72
1.0000 | MEDICATED_PATCH | TRANSDERMAL | Status: DC
  Administered 2020-12-09: 1.5 mg via TRANSDERMAL
  Filled 2020-12-09: qty 1

## 2020-12-09 MED ORDER — ROCURONIUM BROMIDE 10 MG/ML (PF) SYRINGE
PREFILLED_SYRINGE | INTRAVENOUS | Status: DC | PRN
  Administered 2020-12-09: 60 mg via INTRAVENOUS
  Administered 2020-12-09: 20 mg via INTRAVENOUS

## 2020-12-09 MED ORDER — CHLORHEXIDINE GLUCONATE 0.12 % MT SOLN
15.0000 mL | Freq: Once | OROMUCOSAL | Status: AC
  Filled 2020-12-09: qty 15

## 2020-12-09 MED ORDER — CHLORHEXIDINE GLUCONATE 0.12 % MT SOLN
OROMUCOSAL | Status: AC
  Administered 2020-12-09: 15 mL via OROMUCOSAL
  Filled 2020-12-09: qty 15

## 2020-12-09 MED ORDER — PHENYLEPHRINE 40 MCG/ML (10ML) SYRINGE FOR IV PUSH (FOR BLOOD PRESSURE SUPPORT)
PREFILLED_SYRINGE | INTRAVENOUS | Status: DC | PRN
  Administered 2020-12-09: 80 ug via INTRAVENOUS
  Administered 2020-12-09 (×2): 160 ug via INTRAVENOUS
  Administered 2020-12-09 (×2): 80 ug via INTRAVENOUS
  Administered 2020-12-09: 160 ug via INTRAVENOUS

## 2020-12-09 MED ORDER — ACETAMINOPHEN 500 MG PO TABS
1000.0000 mg | ORAL_TABLET | Freq: Once | ORAL | Status: AC
  Administered 2020-12-09: 1000 mg via ORAL
  Filled 2020-12-09: qty 2

## 2020-12-09 MED ORDER — DEXAMETHASONE SODIUM PHOSPHATE 10 MG/ML IJ SOLN
INTRAMUSCULAR | Status: DC | PRN
  Administered 2020-12-09: 10 mg via INTRAVENOUS

## 2020-12-09 MED ORDER — LACTATED RINGERS IV SOLN: INTRAVENOUS | Status: DC

## 2020-12-09 MED ORDER — EPHEDRINE SULFATE-NACL 50-0.9 MG/10ML-% IV SOSY
PREFILLED_SYRINGE | INTRAVENOUS | Status: DC | PRN
  Administered 2020-12-09 (×2): 5 mg via INTRAVENOUS

## 2020-12-09 NOTE — Op Note (Signed)
Video Bronchoscopy with Robotic Assisted Bronchoscopic Navigation   Date of Operation: 12/09/2020   Pre-op Diagnosis: Multiple bilateral pulmonary nodules  Post-op Diagnosis: Same  Surgeon: Baltazar Apo  Assistants: None  Anesthesia: General endotracheal anesthesia  Operation: Flexible video fiberoptic bronchoscopy with robotic assistance and biopsies.  Estimated Blood Loss: Minimal  Complications: None  Indications and History: Phyllis Morgan is a 60 y.o. female with history of chronic bronchitis.  She is a never smoker.  She is being evaluated for multiple scattered rounded pulmonary nodules of unclear etiology.  Recommendation was made to achieve a tissue diagnosis and culture data via robotic assisted navigational bronchoscopy.  The risks, benefits, complications, treatment options and expected outcomes were discussed with the patient.  The possibilities of pneumothorax, pneumonia, reaction to medication, pulmonary aspiration, perforation of a viscus, bleeding, failure to diagnose a condition and creating a complication requiring transfusion or operation were discussed with the patient who freely signed the consent.    Description of Procedure: The patient was seen in the Preoperative Area, was examined and was deemed appropriate to proceed.  The patient was taken to [facility] endoscopy room 3, identified as [name] and the procedure verified as Flexible Video Fiberoptic Bronchoscopy.  A Time Out was held and the above information confirmed.   Prior to the date of the procedure a high-resolution CT scan of the chest was performed. Utilizing ION software program a virtual tracheobronchial tree was generated to allow the creation of distinct navigation pathways to the patient's parenchymal abnormalities. After being taken to the operating room general anesthesia was initiated and the patient  was orally intubated. The video fiberoptic bronchoscope was introduced via the endotracheal tube  and a general inspection was performed which showed normal right and left lung anatomy, aspiration of the bilateral mainstems was completed to remove any remaining secretions. Robotic catheter inserted into patient's endotracheal tube.   Target #1 right middle lobe pulmonary nodule: The distinct navigation pathways prepared prior to this procedure were then utilized to navigate to patient's lesion identified on CT scan. The robotic catheter was secured into place and the vision probe was withdrawn.  Lesion location was approximated using fluoroscopy and radial endobronchial ultrasound for peripheral targeting.  Local registration and targeting was then performed using Cios three-dimensional imaging.  Under fluoroscopic guidance transbronchial needle brushings, transbronchial needle biopsies, and transbronchial forceps biopsies were performed to be sent for cytology and pathology.  Sterile brushing was performed at the nodule to be sent for microbiology.  A bronchioalveolar lavage was performed in the right middle lobe and sent for microbiology.  Target #2 right upper lobe pulmonary nodule: The distinct navigation pathways prepared prior to this procedure were then utilized to navigate to patient's lesion identified on CT scan. The robotic catheter was secured into place and the vision probe was withdrawn.  Lesion location was approximated using fluoroscopy and radial endobronchial ultrasound for peripheral targeting. Local registration and targeting was then performed using Cios three-dimensional imaging. Under fluoroscopic guidance transbronchial needle brushings, transbronchial needle biopsies, and transbronchial forceps biopsies were performed to be sent for cytology and pathology.  Transbronchial brushing was performed to be sent for microbiology.  A bronchioalveolar lavage was performed in the right upper lobe and sent for microbiology.  At the end of the procedure a general airway inspection was  performed and there was no evidence of active bleeding. The bronchoscope was removed.  The patient tolerated the procedure well. There was no significant blood loss and there were  no obvious complications. A post-procedural chest x-ray is pending.  Samples Target #1: 1. Transbronchial needle brushings from right middle lobe nodule 2. Transbronchial Wang needle biopsies from right middle lobe nodule 3. Transbronchial forceps biopsies from right middle lobe nodule 4. Bronchoalveolar lavage from right middle lobe  Samples Target #2: 1. Transbronchial needle brushings from right upper lobe nodule 2. Transbronchial Wang needle biopsies from right upper lobe nodule 3. Transbronchial forceps biopsies from right upper lobe nodule 4. Bronchoalveolar lavage from right upper lobe   Plans:  The patient will be discharged from the PACU to home when recovered from anesthesia and after chest x-ray is reviewed. We will review the cytology, pathology and microbiology results with the patient when they become available. Outpatient followup will be with Dr Lamonte Sakai.   Baltazar Apo, MD, PhD 12/09/2020, 1:34 PM [facility] [phone] or if no answer before 7:00PM call [phone] For any issues after 7:00PM please call eLink [phone]

## 2020-12-09 NOTE — Telephone Encounter (Signed)
TP - please advise. Thanks. 

## 2020-12-09 NOTE — Anesthesia Procedure Notes (Signed)
Procedure Name: Intubation Date/Time: 12/09/2020 12:05 PM Performed by: Thelma Comp, CRNA Pre-anesthesia Checklist: Patient identified, Emergency Drugs available, Suction available and Patient being monitored Patient Re-evaluated:Patient Re-evaluated prior to induction Oxygen Delivery Method: Circle System Utilized Preoxygenation: Pre-oxygenation with 100% oxygen Induction Type: IV induction Ventilation: Mask ventilation without difficulty Laryngoscope Size: Mac and 3 Grade View: Grade I Tube type: Oral Tube size: 8.5 mm Number of attempts: 1 Airway Equipment and Method: Stylet Placement Confirmation: ETT inserted through vocal cords under direct vision, positive ETCO2 and breath sounds checked- equal and bilateral Secured at: 21 cm Tube secured with: Tape Dental Injury: Teeth and Oropharynx as per pre-operative assessment

## 2020-12-09 NOTE — Interval H&P Note (Signed)
History and Physical Interval Note:  12/09/2020 11:33 AM  Phyllis Morgan  has presented today for surgery, with the diagnosis of lung mass.  The various methods of treatment have been discussed with the patient and family. After consideration of risks, benefits and other options for treatment, the patient has consented to  Procedure(s): VIDEO BRONCHOSCOPY WITH ENDOBRONCHIAL ULTRASOUND (Left) VIDEO BRONCHOSCOPY WITH ENDOBRONCHIAL NAVIGATION (N/A) as a surgical intervention.  The patient's history has been reviewed, patient examined, no change in status, stable for surgery.  I have reviewed the patient's chart and labs.  Questions were answered to the patient's satisfaction.     Collene Gobble

## 2020-12-09 NOTE — Transfer of Care (Signed)
Immediate Anesthesia Transfer of Care Note  Patient: Phyllis Morgan  Procedure(s) Performed: VIDEO BRONCHOSCOPY WITH ENDOBRONCHIAL NAVIGATION BRONCHIAL BIOPSIES BRONCHIAL BRUSHINGS BRONCHIAL NEEDLE ASPIRATION BIOPSIES BRONCHIAL WASHINGS  Patient Location: Endoscopy Unit  Anesthesia Type:General  Level of Consciousness: drowsy and patient cooperative  Airway & Oxygen Therapy: Patient Spontanous Breathing and Patient connected to face mask oxygen  Post-op Assessment: Report given to RN and Post -op Vital signs reviewed and stable  Post vital signs: Reviewed and stable  Last Vitals:  Vitals Value Taken Time  BP 113/37 12/09/20 1346  Temp 36.1 C 12/09/20 1346  Pulse 89 12/09/20 1351  Resp 13 12/09/20 1351  SpO2 98 % 12/09/20 1351  Vitals shown include unvalidated device data.  Last Pain:  Vitals:   12/09/20 1346  TempSrc: Temporal  PainSc: Asleep         Complications: No notable events documented.

## 2020-12-09 NOTE — Discharge Instructions (Signed)
Flexible Bronchoscopy, Care After This sheet gives you information about how to care for yourself after your test. Your doctor may also give you more specific instructions. If you have problems or questions, contact your doctor. Follow these instructions at home: Eating and drinking Do not eat or drink anything (not even water) for 2 hours after your test, or until your numbing medicine (local anesthetic) wears off. When your numbness is gone and your cough and gag reflexes have come back, you may: Eat only soft foods. Slowly drink liquids. The day after the test, go back to your normal diet. Driving Do not drive for 24 hours if you were given a medicine to help you relax (sedative). Do not drive or use heavy machinery while taking prescription pain medicine. General instructions  Take over-the-counter and prescription medicines only as told by your doctor. Return to your normal activities as told. Ask what activities are safe for you. Do not use any products that have nicotine or tobacco in them. This includes cigarettes and e-cigarettes. If you need help quitting, ask your doctor. Keep all follow-up visits as told by your doctor. This is important. It is very important if you had a tissue sample (biopsy) taken. Get help right away if: You have shortness of breath that gets worse. You get light-headed. You feel like you are going to pass out (faint). You have chest pain. You cough up: More than a little blood. More blood than before. Summary Do not eat or drink anything (not even water) for 2 hours after your test, or until your numbing medicine wears off. Do not use cigarettes. Do not use e-cigarettes. Get help right away if you have chest pain.  Please call our office for any questions or concerns.  [phone].  This information is not intended to replace advice given to you by your health care provider. Make sure you discuss any questions you have with your health care  provider. Document Released: 11/16/2008 Document Revised: 01/01/2017 Document Reviewed: 02/07/2016 Elsevier Patient Education  2020 [employer].

## 2020-12-09 NOTE — Anesthesia Preprocedure Evaluation (Addendum)
Anesthesia Evaluation  Patient identified by MRN, date of birth, ID band Patient awake    Reviewed: Allergy & Precautions, NPO status , Patient's Chart, lab work & pertinent test results  History of Anesthesia Complications (+) PONV  Airway Mallampati: III  TM Distance: >3 FB Neck ROM: Full    Dental no notable dental hx. (+) Teeth Intact, Dental Advisory Given   Pulmonary shortness of breath, asthma ,    Pulmonary exam normal breath sounds clear to auscultation       Cardiovascular negative cardio ROS Normal cardiovascular exam Rhythm:Regular Rate:Normal  TTE 2022 1. Left ventricular ejection fraction, by estimation, is 60 to 65%. The  left ventricle has normal function. The left ventricle has no regional  wall motion abnormalities. Left ventricular diastolic parameters were  normal. The average left ventricular  global longitudinal strain is -25.7 %. The global longitudinal strain is  normal.  2. Right ventricular systolic function is normal. The right ventricular  size is normal. Tricuspid regurgitation signal is inadequate for assessing  PA pressure.  3. The mitral valve is normal in structure. No evidence of mitral valve  regurgitation. No evidence of mitral stenosis.  4. The aortic valve is tricuspid. Aortic valve regurgitation is not  visualized. No aortic stenosis is present.  5. The inferior vena cava is normal in size with greater than 50%  respiratory variability, suggesting right atrial pressure of 3 mmHg.    Neuro/Psych  Headaches, negative psych ROS   GI/Hepatic Neg liver ROS, GERD  ,  Endo/Other  negative endocrine ROS  Renal/GU negative Renal ROS  negative genitourinary   Musculoskeletal negative musculoskeletal ROS (+)   Abdominal   Peds  Hematology negative hematology ROS (+)   Anesthesia Other Findings   Reproductive/Obstetrics                             Anesthesia Physical Anesthesia Plan  ASA: 2  Anesthesia Plan: General   Post-op Pain Management:    Induction: Intravenous  PONV Risk Score and Plan: 4 or greater and Midazolam, Dexamethasone, Ondansetron and Scopolamine patch - Pre-op  Airway Management Planned: Oral ETT  Additional Equipment:   Intra-op Plan:   Post-operative Plan: Extubation in OR  Informed Consent: I have reviewed the patients History and Physical, chart, labs and discussed the procedure including the risks, benefits and alternatives for the proposed anesthesia with the patient or authorized representative who has indicated his/her understanding and acceptance.     Dental advisory given  Plan Discussed with: CRNA  Anesthesia Plan Comments:        Anesthesia Quick Evaluation

## 2020-12-09 NOTE — Telephone Encounter (Signed)
Spoke with patient last week.

## 2020-12-10 ENCOUNTER — Telehealth: Payer: Self-pay | Admitting: Adult Health

## 2020-12-10 DIAGNOSIS — R911 Solitary pulmonary nodule: Secondary | ICD-10-CM

## 2020-12-10 LAB — CYTOLOGY - NON PAP

## 2020-12-10 NOTE — Telephone Encounter (Signed)
Ambulatory referral to Oncology and STAT PET orders placed. Nothing further needed at this time.

## 2020-12-10 NOTE — Telephone Encounter (Signed)
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 Lamonte Sakai    Set up PET scan  Refer to Oncology -Dr. Lorenza Chick and notified patient of results and upcoming test needed.

## 2020-12-10 NOTE — Anesthesia Postprocedure Evaluation (Signed)
Anesthesia Post Note  Patient: Phyllis Morgan  Procedure(s) Performed: VIDEO BRONCHOSCOPY WITH ENDOBRONCHIAL NAVIGATION BRONCHIAL BIOPSIES BRONCHIAL BRUSHINGS BRONCHIAL NEEDLE ASPIRATION BIOPSIES BRONCHIAL WASHINGS     Patient location during evaluation: PACU Anesthesia Type: General Level of consciousness: awake and alert Pain management: pain level controlled Vital Signs Assessment: post-procedure vital signs reviewed and stable Respiratory status: spontaneous breathing, nonlabored ventilation, respiratory function stable and patient connected to nasal cannula oxygen Cardiovascular status: blood pressure returned to baseline and stable Postop Assessment: no apparent nausea or vomiting Anesthetic complications: no   No notable events documented.  Last Vitals:  Vitals:   12/09/20 1407 12/09/20 1417  BP: (!) 119/42 (!) 121/41  Pulse: 92 91  Resp: 12 16  Temp:    SpO2: 96% 94%    Last Pain:  Vitals:   12/09/20 1417  TempSrc:   PainSc: 0-No pain                 Shaiden Aldous S

## 2020-12-11 ENCOUNTER — Encounter (HOSPITAL_COMMUNITY): Payer: Self-pay | Admitting: Emergency Medicine

## 2020-12-11 ENCOUNTER — Other Ambulatory Visit: Payer: Self-pay

## 2020-12-11 ENCOUNTER — Telehealth: Payer: Self-pay | Admitting: *Deleted

## 2020-12-11 ENCOUNTER — Ambulatory Visit (INDEPENDENT_AMBULATORY_CARE_PROVIDER_SITE_OTHER): Payer: BC Managed Care – PPO | Admitting: Rheumatology

## 2020-12-11 VITALS — BP 119/77 | HR 71 | Ht 64.0 in | Wt 171.4 lb

## 2020-12-11 DIAGNOSIS — Z8719 Personal history of other diseases of the digestive system: Secondary | ICD-10-CM

## 2020-12-11 DIAGNOSIS — M2550 Pain in unspecified joint: Secondary | ICD-10-CM

## 2020-12-11 DIAGNOSIS — M1810 Unilateral primary osteoarthritis of first carpometacarpal joint, unspecified hand: Secondary | ICD-10-CM

## 2020-12-11 DIAGNOSIS — K900 Celiac disease: Secondary | ICD-10-CM | POA: Diagnosis not present

## 2020-12-11 DIAGNOSIS — R768 Other specified abnormal immunological findings in serum: Secondary | ICD-10-CM

## 2020-12-11 DIAGNOSIS — E782 Mixed hyperlipidemia: Secondary | ICD-10-CM

## 2020-12-11 DIAGNOSIS — M25562 Pain in left knee: Secondary | ICD-10-CM

## 2020-12-11 DIAGNOSIS — R911 Solitary pulmonary nodule: Secondary | ICD-10-CM

## 2020-12-11 DIAGNOSIS — G8929 Other chronic pain: Secondary | ICD-10-CM

## 2020-12-11 DIAGNOSIS — Z8249 Family history of ischemic heart disease and other diseases of the circulatory system: Secondary | ICD-10-CM

## 2020-12-11 DIAGNOSIS — U071 COVID-19: Secondary | ICD-10-CM

## 2020-12-11 DIAGNOSIS — Z8709 Personal history of other diseases of the respiratory system: Secondary | ICD-10-CM

## 2020-12-11 DIAGNOSIS — I493 Ventricular premature depolarization: Secondary | ICD-10-CM

## 2020-12-11 DIAGNOSIS — M25512 Pain in left shoulder: Secondary | ICD-10-CM

## 2020-12-11 DIAGNOSIS — R918 Other nonspecific abnormal finding of lung field: Secondary | ICD-10-CM

## 2020-12-11 LAB — ACID FAST SMEAR (AFB, MYCOBACTERIA)
Acid Fast Smear: NEGATIVE
Acid Fast Smear: NEGATIVE
Acid Fast Smear: NEGATIVE
Acid Fast Smear: NEGATIVE

## 2020-12-11 LAB — CULTURE, BAL-QUANTITATIVE W GRAM STAIN
Culture: NO GROWTH
Culture: NO GROWTH
Gram Stain: NONE SEEN

## 2020-12-11 LAB — CULTURE, RESPIRATORY W GRAM STAIN
Culture: NO GROWTH
Culture: NO GROWTH

## 2020-12-11 NOTE — Telephone Encounter (Signed)
TP please advise on the mychart message.  thanks

## 2020-12-11 NOTE — Telephone Encounter (Signed)
I received referral on Phyllis Morgan today. I called and scheduled her to be seen this week with Dr. Julien Nordmann. She verbalized understanding of appt time and place

## 2020-12-11 NOTE — Telephone Encounter (Signed)
Spoke with patient and discussed results

## 2020-12-12 ENCOUNTER — Inpatient Hospital Stay: Payer: BC Managed Care – PPO

## 2020-12-12 ENCOUNTER — Encounter: Payer: Self-pay | Admitting: Internal Medicine

## 2020-12-12 ENCOUNTER — Inpatient Hospital Stay: Payer: BC Managed Care – PPO | Attending: Internal Medicine | Admitting: Internal Medicine

## 2020-12-12 ENCOUNTER — Ambulatory Visit: Payer: BC Managed Care – PPO | Admitting: Adult Health

## 2020-12-12 ENCOUNTER — Ambulatory Visit (HOSPITAL_COMMUNITY)
Admission: RE | Admit: 2020-12-12 | Discharge: 2020-12-12 | Disposition: A | Discharge status: Home / Self Care | Payer: BC Managed Care – PPO | Source: Ambulatory Visit | Attending: Adult Health | Admitting: Adult Health

## 2020-12-12 VITALS — BP 128/61 | HR 97 | Temp 98.5°F | Resp 18 | Ht 64.0 in | Wt 167.1 lb

## 2020-12-12 DIAGNOSIS — F419 Anxiety disorder, unspecified: Secondary | ICD-10-CM | POA: Diagnosis not present

## 2020-12-12 DIAGNOSIS — R0609 Other forms of dyspnea: Secondary | ICD-10-CM

## 2020-12-12 DIAGNOSIS — R911 Solitary pulmonary nodule: Secondary | ICD-10-CM | POA: Diagnosis not present

## 2020-12-12 DIAGNOSIS — C3491 Malignant neoplasm of unspecified part of right bronchus or lung: Secondary | ICD-10-CM | POA: Insufficient documentation

## 2020-12-12 DIAGNOSIS — R059 Cough, unspecified: Secondary | ICD-10-CM

## 2020-12-12 DIAGNOSIS — C3490 Malignant neoplasm of unspecified part of unspecified bronchus or lung: Secondary | ICD-10-CM

## 2020-12-12 DIAGNOSIS — C342 Malignant neoplasm of middle lobe, bronchus or lung: Secondary | ICD-10-CM | POA: Diagnosis not present

## 2020-12-12 DIAGNOSIS — C3432 Malignant neoplasm of lower lobe, left bronchus or lung: Secondary | ICD-10-CM

## 2020-12-12 DIAGNOSIS — Z8379 Family history of other diseases of the digestive system: Secondary | ICD-10-CM

## 2020-12-12 DIAGNOSIS — Z800 Family history of malignant neoplasm of digestive organs: Secondary | ICD-10-CM

## 2020-12-12 DIAGNOSIS — R918 Other nonspecific abnormal finding of lung field: Secondary | ICD-10-CM

## 2020-12-12 DIAGNOSIS — Z808 Family history of malignant neoplasm of other organs or systems: Secondary | ICD-10-CM

## 2020-12-12 DIAGNOSIS — Z8249 Family history of ischemic heart disease and other diseases of the circulatory system: Secondary | ICD-10-CM

## 2020-12-12 LAB — CBC WITH DIFFERENTIAL (CANCER CENTER ONLY)
Abs Immature Granulocytes: 0.01 10*3/uL (ref 0.00–0.07)
Basophils Absolute: 0 10*3/uL (ref 0.0–0.1)
Basophils Relative: 1 %
Eosinophils Absolute: 0.1 10*3/uL (ref 0.0–0.5)
Eosinophils Relative: 1 %
HCT: 35.7 % — ABNORMAL LOW (ref 36.0–46.0)
Hemoglobin: 12.3 g/dL (ref 12.0–15.0)
Immature Granulocytes: 0 %
Lymphocytes Relative: 14 %
Lymphs Abs: 0.7 10*3/uL (ref 0.7–4.0)
MCH: 30.6 pg (ref 26.0–34.0)
MCHC: 34.5 g/dL (ref 30.0–36.0)
MCV: 88.8 fL (ref 80.0–100.0)
Monocytes Absolute: 0.5 10*3/uL (ref 0.1–1.0)
Monocytes Relative: 9 %
Neutro Abs: 3.7 10*3/uL (ref 1.7–7.7)
Neutrophils Relative %: 75 %
Platelet Count: 249 10*3/uL (ref 150–400)
RBC: 4.02 MIL/uL (ref 3.87–5.11)
RDW: 12.6 % (ref 11.5–15.5)
WBC Count: 5 10*3/uL (ref 4.0–10.5)
nRBC: 0 % (ref 0.0–0.2)

## 2020-12-12 LAB — CMP (CANCER CENTER ONLY)
ALT: 12 U/L (ref 0–44)
AST: 16 U/L (ref 15–41)
Albumin: 4 g/dL (ref 3.5–5.0)
Alkaline Phosphatase: 68 U/L (ref 38–126)
Anion gap: 8 (ref 5–15)
BUN: 14 mg/dL (ref 6–20)
CO2: 23 mmol/L (ref 22–32)
Calcium: 8.7 mg/dL — ABNORMAL LOW (ref 8.9–10.3)
Chloride: 109 mmol/L (ref 98–111)
Creatinine: 0.87 mg/dL (ref 0.44–1.00)
GFR, Estimated: >60 mL/min (ref >60)
Glucose, Bld: 109 mg/dL — ABNORMAL HIGH (ref 70–99)
Potassium: 3.4 mmol/L — ABNORMAL LOW (ref 3.5–5.1)
Sodium: 140 mmol/L (ref 135–145)
Total Bilirubin: 0.5 mg/dL (ref 0.3–1.2)
Total Protein: 6.9 g/dL (ref 6.5–8.1)

## 2020-12-12 LAB — GLUCOSE, CAPILLARY: Glucose-Capillary: 92 mg/dL (ref 70–99)

## 2020-12-12 MED ORDER — FLUDEOXYGLUCOSE F - 18 (FDG) INJECTION
8.8000 | Freq: Once | INTRAVENOUS | Status: AC
  Administered 2020-12-12: 8.38 via INTRAVENOUS

## 2020-12-12 NOTE — Progress Notes (Signed)
[place] [phone]   [phone] Multidisciplinary thoracic oncology clinic CONSULT NOTE  REFERRING PHYSICIAN: Dr. Baltazar Apo  REASON FOR CONSULTATION:  60 years old white female recently diagnosed with lung cancer.  HPI Phyllis Morgan is a 60 y.o. female with past medical history significant for asthma celiac disease, colon polyps, GERD, dysrhythmia, kidney stone as well as dyslipidemia and gastric polyps.  The patient mentioned that on April 02, 2020 she had CT of the coronaries for evaluation of her heart the scan showed incidental finding of 4 mm subpleural nodule in the medial aspect of the right upper lobe.  This was felt to be nonspecific and likely benign subpleural lymph node.  CT scan of the chest was recommended in 12 months.  The patient was rescheduled for knee surgery and preoperative evaluation including chest x-ray on November 14, 2020 showed right middle lobe consolidative opacity with additional rounded opacity at the left lung base suspicious for pneumonia.  There was several discrete nodular foci in the left lower lobe up to 1.3 cm and right lower lobe measuring 0.4 cm.  CT angiogram of the chest was performed on December 02, 2020 and that showed bilateral nodular densities the largest on the left lower lobe measuring 3.5 cm.  The largest on the right is in the right middle lobe measuring 2.5 cm.  The patient was referred to Dr. Lamonte Sakai and on December 09, 2020 she underwent flexible video fiberoptic bronchoscopy with robotic assistant and biopsies. The final pathology ([id number]) showed malignant cells consistent with squamous cell carcinoma from biopsy of the right middle lobe brushing and fine-needle aspiration.  There was atypical cells present from the fine-needle aspiration of the right upper lobe lesion.  There was insufficient material for ancillary studies.  The patient had a PET scan performed earlier today and it showed bilateral  FDG avid pulmonary nodules identified compatible with metastatic disease.  The dominant nodule is in the anterior left base measuring 3.3 cm.  There was no FDG avid mediastinal or hilar lymph nodes.  There was no signs of nodal or solid organ metastasis within the abdomen or pelvis.  There was diffusely increased heterogeneous radiotracer uptake throughout the bone marrow but no corresponding lytic or sclerotic bone lesions identified. Dr. Lamonte Sakai kindly referred the patient to me today for evaluation and recommendation regarding treatment of her condition. Seen today she continues to complain of coldness in her feet bronchoscopy.  He also has laboratory type and very anxious about her recent diagnosis.  She denied having any chest pain but has shortness of breath with exertion with mild cough and no hemoptysis.  She continues to have mucus in her throat.  She lost few pounds.  The patient denied having any headache or visual changes. Family history significant for mother with hypertension and skin cancer and father had colon and prostate cancer. The patient is married and has 1 daughter.  She was accompanied by her husband Phyllis Morgan and her daughter Phyllis Morgan was available by phone during the visit.  The patient works part-time for a [employer].  She has no history for smoking but she has long history of secondhand smoking with her parents.  She drinks alcohol rarely and no history of drug abuse. HPI  Past Medical History:  Diagnosis Date   Asthma    exercise induced   Biliary dyskinesia    Celiac disease    Celiac disease 2013   Colon polyps  Dyspnea    Dysrhythmia    PVCs   Gastric polyps    GERD (gastroesophageal reflux disease)    Headache    History of kidney stones    IDA (iron deficiency anemia)    Osteopenia 11/2018   T score -1.3 FRAX 6.5% / 0.4% stable from prior DEXA   Pneumonia    as a child   PONV (postoperative nausea and vomiting)    Seasonal allergies      Past Surgical History:  Procedure Laterality Date   BRONCHIAL BIOPSY  12/09/2020   Procedure: BRONCHIAL BIOPSIES;  Surgeon: Collene Gobble, MD;  Location: [facility];  Service: Pulmonary;;   BRONCHIAL BRUSHINGS  12/09/2020   Procedure: BRONCHIAL BRUSHINGS;  Surgeon: Collene Gobble, MD;  Location: [facility];  Service: Pulmonary;;   BRONCHIAL NEEDLE ASPIRATION BIOPSY  12/09/2020   Procedure: BRONCHIAL NEEDLE ASPIRATION BIOPSIES;  Surgeon: Collene Gobble, MD;  Location: [facility];  Service: Pulmonary;;   BRONCHIAL WASHINGS  12/09/2020   Procedure: BRONCHIAL WASHINGS;  Surgeon: Collene Gobble, MD;  Location: [facility];  Service: Pulmonary;;   CESAREAN SECTION     CHOLECYSTECTOMY  2007   ENDOMETRIAL ABLATION  12/21/2006   HER OPTION CRYOABLATION    HAND SURGERY     THUMB/RIGHT HAND   KNEE ARTHROSCOPY Left 11/26/2020   Procedure: LEFT KNEE ARTHROSCOPY, PARTIAL MEDIAL MENISECTOMY;  Surgeon: Melrose Nakayama, MD;  Location: [facility];  Service: Orthopedics;  Laterality: Left;   KNEE SURGERY Right 2019   Left eye retinal tear  12/18/2018   [facility]   OVARIAN CYST REMOVAL Right 1993   Rt Ovarian Cystectomy   PELVIC LAPAROSCOPY     OVARIAN CYSTECTOMY   TUBAL LIGATION  12/2002   FALLOPE RINGS   UMBILICAL HERNIA REPAIR     VIDEO BRONCHOSCOPY WITH ENDOBRONCHIAL NAVIGATION N/A 12/09/2020   Procedure: VIDEO BRONCHOSCOPY WITH ENDOBRONCHIAL NAVIGATION;  Surgeon: Collene Gobble, MD;  Location: [facility];  Service: Pulmonary;  Laterality: N/A;    Family History  Problem Relation Age of Onset   Hypertension Mother    Heart disease Mother    Cancer Mother        SKIN CANCER   Celiac disease Mother    Hypertension Father    Cancer Father        COLON   Heart disease Maternal Grandmother    Cancer Paternal Grandmother        Female cancer  ?type   Healthy Daughter    Colon cancer Neg Hx    Esophageal cancer Neg Hx    Rectal cancer Neg Hx    Stomach  cancer Neg Hx     Social History Social History   Tobacco Use   Smoking status: Never   Smokeless tobacco: Never  Vaping Use   Vaping Use: Never used  Substance Use Topics   Alcohol use: Yes    Alcohol/week: 0.0 standard drinks    Comment: Rare   Drug use: No    Allergies  Allergen Reactions   Avelox [Moxifloxacin Hcl In Nacl] Nausea Only   Other     PT has Celiac disease    Sulfa Antibiotics Other (See Comments)    Blurred vision    Current Outpatient Medications  Medication Sig Dispense Refill   acetaminophen (TYLENOL) 500 MG tablet Take 1,000 mg by mouth every 6 (six) hours as needed (for pain/headaches.).     albuterol (VENTOLIN HFA) 108 (90 Base) MCG/ACT inhaler INHALE 2 PUFFS  INTO THE LUNGS EVERY 6 HOURS AS NEEDED FOR WHEEZING OR SHORTNESS OF BREATH 54 g 0   cetirizine (ZYRTEC) 10 MG tablet Take 10 mg by mouth daily.     cholecalciferol (VITAMIN D) 25 MCG (1000 UNIT) tablet Take 1,000 Units by mouth daily.     dexlansoprazole (DEXILANT) 60 MG capsule TAKE 1 CAPSULE BY MOUTH EVERY MORNING 90 capsule 4   diphenhydrAMINE (BENADRYL) 25 MG tablet Take 25 mg by mouth every 6 (six) hours as needed.     estradiol (VIVELLE-DOT) 0.1 MG/24HR patch Place 1 patch (0.1 mg total) onto the skin 2 (two) times a week. (Wednesdays & Saturdays) Place one patch onto the skin twice weekly. Pls fill with Novan generic.     fluticasone (FLONASE) 50 MCG/ACT nasal spray Place 2 sprays into the nose in the morning.     guaifenesin (HUMIBID E) 400 MG TABS tablet Take 400 mg by mouth daily.     phenylephrine (SUDAFED PE) 10 MG TABS tablet Take 10 mg by mouth every 4 (four) hours as needed (sinus congestion.).     progesterone (PROMETRIUM) 200 MG capsule Take 1 capsule (200 mg total) by mouth at bedtime. 90 capsule 3   Multiple Vitamin (MULTIVITAMIN WITH MINERALS) TABS tablet Take 1 tablet by mouth daily. (Patient not taking: Reported on 12/12/2020)     No current facility-administered medications  for this visit.    Review of Systems  Constitutional: positive for fatigue and weight loss Eyes: negative Ears, nose, mouth, throat, and face: negative Respiratory: positive for cough, dyspnea on exertion, and sputum Cardiovascular: negative Gastrointestinal: negative Genitourinary:negative Integument/breast: negative Hematologic/lymphatic: negative Musculoskeletal:negative Neurological: negative Behavioral/Psych: positive for anxiety Endocrine: negative Allergic/Immunologic: negative  Physical Exam  BBC:WUGQB, healthy, no distress, well nourished, well developed, and anxious SKIN: skin color, texture, turgor are normal, no rashes or significant lesions HEAD: Normocephalic, No masses, lesions, tenderness or abnormalities EYES: normal, PERRLA, Conjunctiva are pink and non-injected EARS: External ears normal, Canals clear OROPHARYNX:no exudate, no erythema, and lips, buccal mucosa, and tongue normal  NECK: supple, no adenopathy, no JVD LYMPH:  no palpable lymphadenopathy, no hepatosplenomegaly BREAST:not examined LUNGS: clear to auscultation , and palpation HEART: regular rate & rhythm, no murmurs, and no gallops ABDOMEN:abdomen soft, non-tender, normal bowel sounds, and no masses or organomegaly BACK: No CVA tenderness, Range of motion is normal EXTREMITIES:no joint deformities, effusion, or inflammation, no edema  NEURO: alert & oriented x 3 with fluent speech, no focal motor/sensory deficits  PERFORMANCE STATUS: ECOG 1  LABORATORY DATA: Lab Results  Component Value Date   WBC 5.0 12/12/2020   HGB 12.3 12/12/2020   HCT 35.7 (L) 12/12/2020   MCV 88.8 12/12/2020   PLT 249 12/12/2020      Chemistry      Component Value Date/Time   NA 140 11/20/2020 1429   K 3.9 11/20/2020 1429   CL 108 11/20/2020 1429   CO2 27 11/20/2020 1429   BUN 17 11/20/2020 1429   CREATININE 0.87 11/20/2020 1429   CREATININE 0.94 08/15/2019 0940      Component Value Date/Time   CALCIUM  9.1 11/20/2020 1429   ALKPHOS 63 07/30/2020 1517   AST 23 07/30/2020 1517   ALT 15 07/30/2020 1517   BILITOT 0.5 07/30/2020 1517       RADIOGRAPHIC STUDIES: DG Chest 2 View  Addendum Date: 12/02/2020   ADDENDUM REPORT: 12/02/2020 12:54 ADDENDUM: Additionally, there are separate discrete nodular foci seen in the LEFT lower lobe up to the 13  mm and in the RIGHT lower lobe of to 4 mm. These results will be called to the ordering clinician or representative by the Radiologist Assistant, and communication documented in the PACS or [employer]. Electronically Signed   By: Zetta Bills M.D.   On: 12/02/2020 12:54   Result Date: 12/02/2020 CLINICAL DATA:  A 60 year old female presents for pneumonia follow-up. EXAM: CHEST - 2 VIEW COMPARISON:  November 14, 2020. FINDINGS: Persistent opacities in the RIGHT mid chest in the RIGHT middle lobe and in the LEFT lung base. The show more pronounced convex margins than on the previous study. At the LEFT lung base measuring up to 3.7 cm and in the RIGHT middle lobe up to 2.8 cm. Pulmonary neoplasm or metastatic disease would be difficult to exclude given this appearance. CT of the chest is suggested for further evaluation. On limited assessment there is no acute skeletal process. Cardiomediastinal contours and hilar structures are unchanged. No lobar consolidation or effusion. IMPRESSION: Ovoid nodular and masslike findings in the chest with more pronounced, defined appearance raising the question of pulmonary neoplasm. CT of the chest is suggested for further evaluation. Electronically Signed: By: Zetta Bills M.D. On: 12/02/2020 12:49   DG Chest 2 View  Result Date: 11/14/2020 CLINICAL DATA:  Preoperative clearance EXAM: CHEST - 2 VIEW COMPARISON:  2021 FINDINGS: There is a consolidative opacity within the right middle lobe. Additional rounded opacity at the left lung base. No pleural effusion. Normal heart size. No acute osseous abnormality. IMPRESSION:  Right middle lobe consolidative opacity. Additional rounded opacity at the left lung base. May reflect pneumonia in the appropriate setting. Recommend follow-up imaging to ensure resolution. Electronically Signed   By: Macy Mis M.D.   On: 11/14/2020 11:19   CT Angio Chest Pulmonary Embolism (PE) W or WO Contrast  Result Date: 12/02/2020 CLINICAL DATA:  Elevated D-dimer EXAM: CT ANGIOGRAPHY CHEST WITH CONTRAST TECHNIQUE: Multidetector CT imaging of the chest was performed using the standard protocol during bolus administration of intravenous contrast. Multiplanar CT image reconstructions and MIPs were obtained to evaluate the vascular anatomy. CONTRAST:  152m OMNIPAQUE IOHEXOL 350 MG/ML SOLN COMPARISON:  04/02/2020 FINDINGS: Cardiovascular: No filling defects in the pulmonary arteries to suggest pulmonary emboli. Heart is normal size. Aorta is normal caliber. Mediastinum/Nodes: No mediastinal, hilar, or axillary adenopathy. Trachea and esophagus are unremarkable. Thyroid unremarkable. Lungs/Pleura: Bilateral nodular densities seen. The largest on the left is in the lower lobe measuring 3.5 cm. The largest on the right is in the right middle lobe measuring 2.5 cm. Some areas contain air bronchograms or areas of cavitation. Others appear to have vessels running through them. No effusions. Upper Abdomen: Imaging into the upper abdomen demonstrates no acute findings. Musculoskeletal: Chest wall soft tissues are unremarkable. No acute bony abnormality. Review of the MIP images confirms the above findings. IMPRESSION: No evidence of pulmonary embolus. Bilateral nodular opacities in both lungs measuring up to 3.5 cm. Given their appearance, favor septic emboli or other inflammatory/infectious process. However, follow-up is recommended after treatment to confirm resolution and to exclude metastases. Electronically Signed   By: KRolm BaptiseM.D.   On: 12/02/2020 18:37   NM PET Image Initial (PI) Skull Base To  Thigh  Result Date: 12/12/2020 CLINICAL DATA:  Initial treatment strategy for lung cancer. EXAM: NUCLEAR MEDICINE PET SKULL BASE TO THIGH TECHNIQUE: 8.38 mCi F-18 FDG was injected intravenously. Full-ring PET imaging was performed from the skull base to thigh after the radiotracer. CT data was obtained  and used for attenuation correction and anatomic localization. Fasting blood glucose: 92 mg/dl COMPARISON:  CT angio chest 12/02/20 FINDINGS: Mediastinal blood pool activity: SUV max 2.15 Liver activity: SUV max NA NECK: There is nonspecific asymmetric increased uptake within the right tonsillar region with an SUV max of 5.75. No FDG avid lymph nodes within the soft tissues of the neck. Incidental CT findings: none CHEST: No FDG avid axillary, supraclavicular, mediastinal, or hilar lymph nodes. Bilateral FDG avid pulmonary nodules are identified. Index nodules include: Right upper lobe lung nodule measures 2.2 cm within SUV max of 10.11, image 20/8. Lingular nodule measures 1.4 cm with SUV max of 7.62, image 36/8. Within the anterior left lung base nodule measures 3.3 cm with an SUV max of 7.73, image 43/8. Nodule within the medial right lung base measures 1.9 cm with SUV max of 4.08 Right middle lobe lung non measures 2.3 cm with SUV max of 9.68, image 46/8 Incidental CT findings: none ABDOMEN/PELVIS: No abnormal hypermetabolic activity within the liver, pancreas, adrenal glands, or spleen. No hypermetabolic lymph nodes in the abdomen or pelvis. Incidental CT findings: Cholecystectomy. Posterior left kidney cyst measures 2.6 cm. The spleen measures 7.4 x 5.2 by 13.6 cm (volume = 270 cm^3) SKELETON: There is diffusely increased, heterogeneous radiotracer uptake is identified throughout the bone marrow. No dominant focus of increased radiotracer uptake identified highly specific for skeletal metastasis. On the corresponding CT images no suspicious bone lesions identified. Incidental CT findings: none IMPRESSION: 1.  Bilateral FDG avid pulmonary nodules are identified compatible with metastatic disease. The dominant nodule is in the anterior left base measuring 3.3 cm. No FDG avid mediastinal or hilar lymph nodes. 2. No signs of nodal or solid organ metastasis within the abdomen or pelvis. 3. Diffusely increased heterogeneous radiotracer uptake is noted throughout the bone marrow. No corresponding lytic or sclerotic bone lesions identified on the CT images. Findings may be seen in the setting of anemia, bone marrow stimulating therapy, as well as diffuse bone marrow involvement by tumor Electronically Signed   By: Kerby Moors M.D.   On: 12/12/2020 09:46   DG Chest Port 1 View  Result Date: 12/09/2020 CLINICAL DATA:  Status post bronchoscopy. EXAM: PORTABLE CHEST 1 VIEW COMPARISON:  12/02/2020 FINDINGS: No evidence for pneumothorax or pleural effusion. Nodular density seen at the lung bases previously, left greater than right, are similar in the interval with subtle nodular opacity in the suprahilar right lung. The cardiopericardial silhouette is within normal limits for size. The visualized bony structures of the thorax show no acute abnormality. Telemetry leads overlie the chest. IMPRESSION: 1. No pneumothorax or pleural effusion. 2. Stable bilateral pulmonary nodules. Electronically Signed   By: Misty Stanley M.D.   On: 12/09/2020 14:09   DG C-Arm 1-60 Min-No Report  Result Date: 12/09/2020 Fluoroscopy was utilized by the requesting physician.  No radiographic interpretation.   ECHOCARDIOGRAM COMPLETE  Result Date: 12/03/2020    ECHOCARDIOGRAM REPORT   Patient Name:   FREEDA SPIVEY Date of Exam: 12/03/2020 Medical Rec #:  [id number]       Height:       64.0 in Accession #:    [id number]      Weight:       172.2 lb Date of Birth:  May 16, 1960        BSA:          1.836 m Patient Age:    36 years        BP:  100/68 mmHg Patient Gender: F               HR:           78 bpm. Exam Location:  [address]  Procedure: 2D Echo, 3D Echo, Cardiac Doppler, Color Doppler and Strain Analysis Indications:    r06.00 Dyspnea  History:        Patient has no prior history of Echocardiogram examinations.                 Signs/Symptoms:Shortness of Breath. History of COVID 19.  Sonographer:    Lenard Galloway BA, RDCS Referring Phys: Fonnie Mu PARRETT IMPRESSIONS  1. Left ventricular ejection fraction, by estimation, is 60 to 65%. The left ventricle has normal function. The left ventricle has no regional wall motion abnormalities. Left ventricular diastolic parameters were normal. The average left ventricular global longitudinal strain is -25.7 %. The global longitudinal strain is normal.  2. Right ventricular systolic function is normal. The right ventricular size is normal. Tricuspid regurgitation signal is inadequate for assessing PA pressure.  3. The mitral valve is normal in structure. No evidence of mitral valve regurgitation. No evidence of mitral stenosis.  4. The aortic valve is tricuspid. Aortic valve regurgitation is not visualized. No aortic stenosis is present.  5. The inferior vena cava is normal in size with greater than 50% respiratory variability, suggesting right atrial pressure of 3 mmHg. FINDINGS  Left Ventricle: Left ventricular ejection fraction, by estimation, is 60 to 65%. The left ventricle has normal function. The left ventricle has no regional wall motion abnormalities. The average left ventricular global longitudinal strain is -25.7 %. The global longitudinal strain is normal. The left ventricular internal cavity size was normal in size. There is no left ventricular hypertrophy. Left ventricular diastolic parameters were normal. Right Ventricle: The right ventricular size is normal. No increase in right ventricular wall thickness. Right ventricular systolic function is normal. Tricuspid regurgitation signal is inadequate for assessing PA pressure. Left Atrium: Left atrial size was normal in size. Right  Atrium: Right atrial size was normal in size. Pericardium: There is no evidence of pericardial effusion. Mitral Valve: The mitral valve is normal in structure. No evidence of mitral valve regurgitation. No evidence of mitral valve stenosis. Tricuspid Valve: The tricuspid valve is normal in structure. Tricuspid valve regurgitation is trivial. Aortic Valve: The aortic valve is tricuspid. Aortic valve regurgitation is not visualized. No aortic stenosis is present. Pulmonic Valve: The pulmonic valve was normal in structure. Pulmonic valve regurgitation is not visualized. Aorta: The aortic root is normal in size and structure. Venous: The inferior vena cava is normal in size with greater than 50% respiratory variability, suggesting right atrial pressure of 3 mmHg. IAS/Shunts: No atrial level shunt detected by color flow Doppler.  LEFT VENTRICLE PLAX 2D LVIDd:         3.80 cm   Diastology LVIDs:         2.00 cm   LV e' medial:    9.46 cm/s LV PW:         0.70 cm   LV E/e' medial:  9.1 LV IVS:        0.60 cm   LV e' lateral:   15.40 cm/s LVOT diam:     1.90 cm   LV E/e' lateral: 5.6 LV SV:         73 LV SV Index:   40        2D Longitudinal Strain LVOT Area:  2.84 cm  2D Strain GLS (A2C):   -27.7 %                          2D Strain GLS (A3C):   -24.5 %                          2D Strain GLS (A4C):   -24.9 %                          2D Strain GLS Avg:     -25.7 %                           3D Volume EF:                          3D EF:        61 %                          LV EDV:       90 ml                          LV ESV:       35 ml                          LV SV:        55 ml RIGHT VENTRICLE             IVC RV Basal diam:  3.10 cm     IVC diam: 1.60 cm RV Mid diam:    2.30 cm RV S prime:     13.20 cm/s TAPSE (M-mode): 2.4 cm RVSP:           12.4 mmHg LEFT ATRIUM         Index       RIGHT ATRIUM           Index LA diam:    3.00 cm 1.63 cm/m  RA Pressure: 3.00 mmHg                                 RA Area:     8.13 cm                                  RA Volume:   13.90 ml  7.57 ml/m  AORTIC VALVE LVOT Vmax:   121.00 cm/s LVOT Vmean:  80.900 cm/s LVOT VTI:    0.258 m  AORTA Ao Root diam: 2.90 cm Ao Asc diam:  2.90 cm MITRAL VALVE               TRICUSPID VALVE MV Area (PHT):             TR Peak grad:   9.4 mmHg MV Decel Time: 195 msec    TR Vmax:        153.00 cm/s MV E velocity: 85.90 cm/s  Estimated RAP:  3.00 mmHg MV A velocity: 87.50 cm/s  RVSP:           12.4 mmHg MV E/A ratio:  0.98  SHUNTS                            Systemic VTI:  0.26 m                            Systemic Diam: 1.90 cm Dalton McleanMD Electronically signed by Franki Monte Signature Date/Time: 12/03/2020/5:01:16 PM    Final    DG C-ARM BRONCHOSCOPY  Result Date: 12/09/2020 C-ARM BRONCHOSCOPY: Fluoroscopy was utilized by the requesting physician.  No radiographic interpretation.    ASSESSMENT: This is a very pleasant 60 years old white female recently diagnosed with a stage IV (T2 a, N0, M1a) non-small cell lung cancer, squamous cell carcinoma in a never smoker patient presented with bilateral pulmonary nodules and no evidence of mediastinal, supraclavicular, axillary lymphadenopathy or distant metastatic disease diagnosed in November 2022.  Her diagnosis was squamous cell carcinoma in the patient with no smoking history is unusual but not completely excluded.  This could be partially due to heterogeneity of the tumor or because of the secondhand smoking exposure in the past.   PLAN: I had a lengthy discussion with the patient and her family today about her current disease stage, prognosis and treatment options. I personally and independently reviewed the scan images including the PET scan and discussed the result and showed the images to the patient and her family today. I recommended for the patient to complete the staging work-up by ordering MRI of the brain to rule out brain metastasis. I also discussed with the  patient consideration of repeat biopsy of one of the other pulmonary nodules for confirmation of the tissue diagnosis and to rule out any heterogeneity in the tumor.  She is very interested in this option and we will arrange for the patient to have CT-guided core biopsy of the left lower lobe basilar nodule to have enough material for molecular studies and also to confirm her diagnosis. In the meantime I will send a blood test to [facility] for molecular studies because of the insufficient material and the concerning delay in receiving molecular studies from the tissue biopsy. I will arrange for the patient to come back for follow-up visit in 2-3 weeks for more detailed discussion of her treatment options based on the molecular studies. If she has no actionable mutations and the final pathology is still consistent with squamous cell carcinoma of the lung, will discuss with her the option of palliative care versus palliative systemic chemotherapy with carboplatin, paclitaxel and Keytruda or single agent immunotherapy if the patient has PD-L1 expression of 50% or higher. The patient and her family had a lot of questions and I answered them completely to their satisfaction. She was advised to call immediately if she has any other concerning symptoms in the interval.  The patient voices understanding of current disease status and treatment options and is in agreement with the current care plan.  All questions were answered. The patient knows to call the clinic with any problems, questions or concerns. We can certainly see the patient much sooner if necessary.  Thank you so much for allowing me to participate in the care of MAURYA NETHERY. I will continue to follow up the patient with you and assist in her care.  The total time spent in the appointment was 90 minutes.  Disclaimer: This note was dictated with voice recognition software. Similar sounding words can inadvertently be transcribed  and may not  be corrected upon review.   Eilleen Kempf December 12, 2020, 3:09 PM

## 2020-12-13 ENCOUNTER — Encounter (HOSPITAL_COMMUNITY): Payer: Self-pay | Admitting: Radiology

## 2020-12-13 ENCOUNTER — Other Ambulatory Visit: Payer: Self-pay

## 2020-12-13 DIAGNOSIS — C3491 Malignant neoplasm of unspecified part of right bronchus or lung: Secondary | ICD-10-CM

## 2020-12-13 NOTE — Progress Notes (Signed)
Patient Name  Phyllis Morgan, Phyllis Morgan Legal Sex  Female DOB  November 10, 1960 SSN  [id number] Address  [address]  [place] [address] Phone  [phone] [facility])  [phone] (Mobile) *Preferred*    RE: CT Biopsy Received: Today Suttle, Rosanne Ashing, MD  Garth Bigness D Approved for CT guided lung mass biopsy.   Dylan        Previous Messages   ----- Message -----  From: Garth Bigness D  Sent: 12/12/2020   5:10 PM EST  To: [facility] Procedure Requests  Subject: CT Biopsy                                       Procedure:  CT Biopsy   Reason:  Malignant neoplasm of unspecified part of unspecified bronchus or lung, CT-guided core biopsy of the left lung base pulmonary nodule for molecular studies   History:  NM PET in computer   Provider:  Curt Bears   Provider Contact:  [phone]

## 2020-12-16 ENCOUNTER — Telehealth: Payer: Self-pay | Admitting: *Deleted

## 2020-12-16 ENCOUNTER — Inpatient Hospital Stay: Payer: BC Managed Care – PPO

## 2020-12-16 ENCOUNTER — Encounter: Payer: Self-pay | Admitting: Internal Medicine

## 2020-12-16 ENCOUNTER — Other Ambulatory Visit: Payer: Self-pay | Admitting: Internal Medicine

## 2020-12-16 ENCOUNTER — Other Ambulatory Visit: Payer: Self-pay

## 2020-12-16 ENCOUNTER — Other Ambulatory Visit: Payer: Self-pay | Admitting: Medical Oncology

## 2020-12-16 DIAGNOSIS — C3491 Malignant neoplasm of unspecified part of right bronchus or lung: Secondary | ICD-10-CM

## 2020-12-16 DIAGNOSIS — C3490 Malignant neoplasm of unspecified part of unspecified bronchus or lung: Secondary | ICD-10-CM

## 2020-12-16 LAB — CBC WITH DIFFERENTIAL (CANCER CENTER ONLY)
Abs Immature Granulocytes: 0.02 10*3/uL (ref 0.00–0.07)
Basophils Absolute: 0 10*3/uL (ref 0.0–0.1)
Basophils Relative: 1 %
Eosinophils Absolute: 0.1 10*3/uL (ref 0.0–0.5)
Eosinophils Relative: 2 %
HCT: 36.9 % (ref 36.0–46.0)
Hemoglobin: 12.8 g/dL (ref 12.0–15.0)
Immature Granulocytes: 1 %
Lymphocytes Relative: 16 %
Lymphs Abs: 0.7 10*3/uL (ref 0.7–4.0)
MCH: 30.4 pg (ref 26.0–34.0)
MCHC: 34.7 g/dL (ref 30.0–36.0)
MCV: 87.6 fL (ref 80.0–100.0)
Monocytes Absolute: 0.4 10*3/uL (ref 0.1–1.0)
Monocytes Relative: 10 %
Neutro Abs: 3.2 10*3/uL (ref 1.7–7.7)
Neutrophils Relative %: 70 %
Platelet Count: 247 10*3/uL (ref 150–400)
RBC: 4.21 MIL/uL (ref 3.87–5.11)
RDW: 12.8 % (ref 11.5–15.5)
WBC Count: 4.4 10*3/uL (ref 4.0–10.5)
nRBC: 0 % (ref 0.0–0.2)

## 2020-12-16 LAB — CMP (CANCER CENTER ONLY)
ALT: 11 U/L (ref 0–44)
AST: 17 U/L (ref 15–41)
Albumin: 4 g/dL (ref 3.5–5.0)
Alkaline Phosphatase: 58 U/L (ref 38–126)
Anion gap: 8 (ref 5–15)
BUN: 14 mg/dL (ref 6–20)
CO2: 22 mmol/L (ref 22–32)
Calcium: 9 mg/dL (ref 8.9–10.3)
Chloride: 109 mmol/L (ref 98–111)
Creatinine: 0.84 mg/dL (ref 0.44–1.00)
GFR, Estimated: >60 mL/min (ref >60)
Glucose, Bld: 100 mg/dL — ABNORMAL HIGH (ref 70–99)
Potassium: 3.9 mmol/L (ref 3.5–5.1)
Sodium: 139 mmol/L (ref 135–145)
Total Bilirubin: 0.7 mg/dL (ref 0.3–1.2)
Total Protein: 7 g/dL (ref 6.5–8.1)

## 2020-12-16 MED ORDER — LORAZEPAM 0.5 MG PO TABS: ORAL_TABLET | ORAL | 0 refills | Status: DC

## 2020-12-16 NOTE — Telephone Encounter (Signed)
I called to check on Phyllis Morgan.  Her treatment plan is scheduled and wanted to see if she had any questions.  She did not have questions regarding treatment plan but would like nutrition and CSW referrals.  I checked and these referrals have been place.  Patient would also like something to help her relax during mri brain. I will reach out to Dr. Julien Nordmann for an update and I communicated that with the patient.

## 2020-12-16 NOTE — Telephone Encounter (Signed)
I received a message back from Dr. Julien Nordmann stating he will send prescription to pharmacy. I called patient to update but could not reach.  I explained dr Julien Nordmann has ordered her medication before her mri brain and not to take if driving. I also asked that if she has any questions to call the cancer center.

## 2020-12-17 ENCOUNTER — Telehealth: Payer: Self-pay | Admitting: Medical Oncology

## 2020-12-17 NOTE — Telephone Encounter (Signed)
Faxed records per Jennifer's request.

## 2020-12-18 ENCOUNTER — Encounter: Payer: Self-pay | Admitting: General Practice

## 2020-12-18 LAB — ANAEROBIC CULTURE W GRAM STAIN
Gram Stain: NONE SEEN
Gram Stain: NONE SEEN

## 2020-12-18 NOTE — Progress Notes (Signed)
[facility] CSW Progress Notes  Referral received from medical oncology, request to help patient w coping with recent diagnosis of lung cancer.  Spoke w patient, this was a completely unexpected diagnosis and she is understandably dealing with multiple symptoms of trauma including difficulty with concentration, weakness, shakiness, worry, undercertainty.  She is trying to return to work this week in order to have something else to focus her mind on.  She is awaiting test results and thinks her anxiety will decrease once she has more clarity on her treatment plan.  She will call CSW to schedule time to process her feelings and concerns once she has more information about her situation.  She does like information - sent her links to several national organizations for patients diagnosed with lung cancer and also my contact information so she can reach out for support as needed/desired.  Edwyna Shell, LCSW Clinical Social Worker Phone:  [phone]

## 2020-12-19 ENCOUNTER — Ambulatory Visit (HOSPITAL_COMMUNITY)
Admission: RE | Admit: 2020-12-19 | Discharge: 2020-12-19 | Disposition: A | Discharge status: Home / Self Care | Payer: BC Managed Care – PPO | Source: Ambulatory Visit | Attending: Internal Medicine | Admitting: Internal Medicine

## 2020-12-19 ENCOUNTER — Encounter: Payer: Self-pay | Admitting: *Deleted

## 2020-12-19 DIAGNOSIS — C3490 Malignant neoplasm of unspecified part of unspecified bronchus or lung: Secondary | ICD-10-CM | POA: Insufficient documentation

## 2020-12-19 MED ORDER — GADOBUTROL 1 MMOL/ML IV SOLN
8.5000 mL | Freq: Once | INTRAVENOUS | Status: AC | PRN
  Administered 2020-12-19: 21:00:00 8.5 mL via INTRAVENOUS

## 2020-12-19 NOTE — Progress Notes (Signed)
Oncology Nurse Navigator Documentation  Oncology Nurse Navigator Flowsheets 12/19/2020  Abnormal Finding Date 12/02/2020  Confirmed Diagnosis Date 12/09/2020  Diagnosis Status Pending Molecular Studies  Navigator Follow Up Date: 12/24/2020  Navigator Follow Up Reason: Radiology  Navigator Location [facility]  Navigator Encounter Type Other:  Patient Visit Type Other  Treatment Phase Other  Interventions None Required  Acuity Level 1-No Barriers  Time Spent with Patient 15

## 2020-12-20 ENCOUNTER — Other Ambulatory Visit (HOSPITAL_COMMUNITY): Payer: BC Managed Care – PPO

## 2020-12-20 ENCOUNTER — Other Ambulatory Visit (HOSPITAL_COMMUNITY): Payer: Self-pay | Admitting: Physician Assistant

## 2020-12-23 ENCOUNTER — Encounter (HOSPITAL_COMMUNITY): Payer: Self-pay

## 2020-12-23 ENCOUNTER — Ambulatory Visit (HOSPITAL_COMMUNITY)
Admission: RE | Admit: 2020-12-23 | Discharge: 2020-12-23 | Disposition: A | Discharge status: Home / Self Care | Payer: BC Managed Care – PPO | Source: Ambulatory Visit | Attending: Internal Medicine | Admitting: Internal Medicine

## 2020-12-23 ENCOUNTER — Ambulatory Visit (HOSPITAL_COMMUNITY)
Admission: RE | Admit: 2020-12-23 | Discharge: 2020-12-23 | Disposition: A | Discharge status: Home / Self Care | Payer: BC Managed Care – PPO | Source: Ambulatory Visit | Attending: Interventional Radiology | Admitting: Interventional Radiology

## 2020-12-23 ENCOUNTER — Other Ambulatory Visit: Payer: Self-pay

## 2020-12-23 DIAGNOSIS — C3490 Malignant neoplasm of unspecified part of unspecified bronchus or lung: Secondary | ICD-10-CM | POA: Insufficient documentation

## 2020-12-23 DIAGNOSIS — Z9889 Other specified postprocedural states: Secondary | ICD-10-CM | POA: Insufficient documentation

## 2020-12-23 DIAGNOSIS — K900 Celiac disease: Secondary | ICD-10-CM | POA: Diagnosis not present

## 2020-12-23 DIAGNOSIS — J45909 Unspecified asthma, uncomplicated: Secondary | ICD-10-CM | POA: Diagnosis not present

## 2020-12-23 DIAGNOSIS — E785 Hyperlipidemia, unspecified: Secondary | ICD-10-CM | POA: Insufficient documentation

## 2020-12-23 LAB — CBC
HCT: 40.7 % (ref 36.0–46.0)
Hemoglobin: 13.5 g/dL (ref 12.0–15.0)
MCH: 30.5 pg (ref 26.0–34.0)
MCHC: 33.2 g/dL (ref 30.0–36.0)
MCV: 91.9 fL (ref 80.0–100.0)
Platelets: 239 10*3/uL (ref 150–400)
RBC: 4.43 MIL/uL (ref 3.87–5.11)
RDW: 12.9 % (ref 11.5–15.5)
WBC: 5.6 10*3/uL (ref 4.0–10.5)
nRBC: 0 % (ref 0.0–0.2)

## 2020-12-23 LAB — PROTIME-INR
INR: 1 (ref 0.8–1.2)
Prothrombin Time: 13.4 seconds (ref 11.4–15.2)

## 2020-12-23 LAB — GLUCOSE, CAPILLARY: Glucose-Capillary: 90 mg/dL (ref 70–99)

## 2020-12-23 MED ORDER — FENTANYL CITRATE (PF) 100 MCG/2ML IJ SOLN
INTRAMUSCULAR | Status: AC | PRN
  Administered 2020-12-23: 50 ug via INTRAVENOUS

## 2020-12-23 MED ORDER — MIDAZOLAM HCL 2 MG/2ML IJ SOLN
INTRAMUSCULAR | Status: AC
  Filled 2020-12-23: qty 4

## 2020-12-23 MED ORDER — LIDOCAINE HCL 1 % IJ SOLN
INTRAMUSCULAR | Status: AC
  Filled 2020-12-23: qty 10

## 2020-12-23 MED ORDER — MIDAZOLAM HCL 2 MG/2ML IJ SOLN
INTRAMUSCULAR | Status: AC | PRN
  Administered 2020-12-23 (×2): 1 mg via INTRAVENOUS

## 2020-12-23 MED ORDER — HYDROCODONE-ACETAMINOPHEN 5-325 MG PO TABS: 1.0000 | ORAL_TABLET | ORAL | Status: DC | PRN

## 2020-12-23 MED ORDER — SODIUM CHLORIDE 0.9 % IV SOLN: INTRAVENOUS | Status: DC

## 2020-12-23 MED ORDER — FENTANYL CITRATE (PF) 100 MCG/2ML IJ SOLN
INTRAMUSCULAR | Status: AC
  Filled 2020-12-23: qty 4

## 2020-12-23 NOTE — H&P (Signed)
Chief Complaint: Patient was seen in consultation today for left lung nodule biopsy at the request of [facility]  Referring Physician(s): Mohamed,Mohamed  Supervising Physician: Mir, Biochemist, clinical  Patient Status: [facility] - Out-pt  History of Present Illness: Phyllis Morgan is a 60 y.o. female   Asthma; celiac dz; renal stone; HLD Incidentally found RML lung lesion ---- bronchoscopy 12/04/20: sq cell carcinoma  11/10 PET:  IMPRESSION: 1. Bilateral FDG avid pulmonary nodules are identified compatible with metastatic disease. The dominant nodule is in the anterior left base measuring 3.3 cm. No FDG avid mediastinal or hilar lymph nodes. 2. No signs of nodal or solid organ metastasis within the abdomen or pelvis. 3. Diffusely increased heterogeneous radiotracer uptake is noted throughout the bone marrow. No corresponding lytic or sclerotic bone lesions identified on the CT images. Findings may be seen in the setting of anemia, bone marrow stimulating therapy, as well as diffuse bone marrow involvement by tumor   11/17 MRI:  IMPRESSION: 1. Small right frontal and left parieto-occipital enhancing lesions. While not particularly masslike in appearance, these are still most concerning for metastatic disease given the presence of mild edema and known lung metastases. A nonneoplastic inflammatory process is possible but considered less likely. 2. Mild enlargement of the pituitary gland, favored to be chronic and benign such as from an underlying microadenoma, cyst, or hyperplasia with a metastasis considered much less likely. Attention on follow-up.  Dr Julien Nordmann note 12/12/20: ASSESSMENT: This is a very pleasant 60 years old white female recently diagnosed with a stage IV (T2 a, N0, M1a) non-small cell lung cancer, squamous cell carcinoma in a never smoker patient presented with bilateral pulmonary nodules and no evidence of mediastinal, supraclavicular, axillary lymphadenopathy or  distant metastatic disease diagnosed in November 2022.  Her diagnosis was squamous cell carcinoma in the patient with no smoking history is unusual but not completely excluded.  This could be partially due to heterogeneity of the tumor or because of the secondhand smoking exposure in the past. I also discussed with the patient consideration of repeat biopsy of one of the other pulmonary nodules for confirmation of the tissue diagnosis and to rule out any heterogeneity in the tumor.  She is very interested in this option and we will arrange for the patient to have CT-guided core biopsy of the left lower lobe basilar nodule to have enough material for molecular studies and also to confirm her diagnosis. If she has no actionable mutations and the final pathology is still consistent with squamous cell carcinoma of the lung, will discuss with her the option of palliative care versus palliative systemic chemotherapy with carboplatin, paclitaxel and Keytruda or single agent immunotherapy if the patient has PD-L1 expression of 50% or higher.   She is scheduled for left lung nodule biopsy/molecular studies in IR today  Past Surgical History:  Procedure Laterality Date   BRONCHIAL BIOPSY  12/09/2020   Procedure: BRONCHIAL BIOPSIES;  Surgeon: Collene Gobble, MD;  Location: [facility] ENDOSCOPY;  Service: Pulmonary;;   BRONCHIAL BRUSHINGS  12/09/2020   Procedure: BRONCHIAL BRUSHINGS;  Surgeon: Collene Gobble, MD;  Location: [facility] ENDOSCOPY;  Service: Pulmonary;;   BRONCHIAL NEEDLE ASPIRATION BIOPSY  12/09/2020   Procedure: BRONCHIAL NEEDLE ASPIRATION BIOPSIES;  Surgeon: Collene Gobble, MD;  Location: [place];  Service: Pulmonary;;   BRONCHIAL WASHINGS  12/09/2020   Procedure: BRONCHIAL WASHINGS;  Surgeon: Collene Gobble, MD;  Location: [facility] ENDOSCOPY;  Service: Pulmonary;;   CESAREAN SECTION     CHOLECYSTECTOMY  2007   ENDOMETRIAL ABLATION  12/21/2006   HER OPTION CRYOABLATION    HAND SURGERY     THUMB/RIGHT  HAND   KNEE ARTHROSCOPY Left 11/26/2020   Procedure: LEFT KNEE ARTHROSCOPY, PARTIAL MEDIAL MENISECTOMY;  Surgeon: Melrose Nakayama, MD;  Location: [facility] ORS;  Service: Orthopedics;  Laterality: Left;   KNEE SURGERY Right 2019   Left eye retinal tear  12/18/2018   [facility]   OVARIAN CYST REMOVAL Right 1993   Rt Ovarian Cystectomy   PELVIC LAPAROSCOPY     OVARIAN CYSTECTOMY   TUBAL LIGATION  12/2002   FALLOPE RINGS   UMBILICAL HERNIA REPAIR     VIDEO BRONCHOSCOPY WITH ENDOBRONCHIAL NAVIGATION N/A 12/09/2020   Procedure: VIDEO BRONCHOSCOPY WITH ENDOBRONCHIAL NAVIGATION;  Surgeon: Collene Gobble, MD;  Location: [place] ENDOSCOPY;  Service: Pulmonary;  Laterality: N/A;    Allergies: Avelox [moxifloxacin hcl in nacl], Other, and Sulfa antibiotics  Medications: Prior to Admission medications   Medication Sig Start Date End Date Taking? Authorizing Provider  acetaminophen (TYLENOL) 500 MG tablet Take 1,000 mg by mouth every 6 (six) hours as needed (for pain/headaches.).   Yes [provider]  cetirizine (ZYRTEC) 10 MG tablet Take 10 mg by mouth daily.   Yes [provider]  cholecalciferol (VITAMIN D) 25 MCG (1000 UNIT) tablet Take 1,000 Units by mouth daily.   Yes [provider]  dexlansoprazole (DEXILANT) 60 MG capsule TAKE 1 CAPSULE BY MOUTH EVERY MORNING 03/07/20  Yes Esterwood, Amy S, PA-C  diphenhydrAMINE (BENADRYL) 25 MG tablet Take 25 mg by mouth every 6 (six) hours as needed.   Yes [provider]  estradiol (VIVELLE-DOT) 0.1 MG/24HR patch Place 1 patch (0.1 mg total) onto the skin 2 (two) times a week. (Wednesdays & Saturdays) Place one patch onto the skin twice weekly. Pls fill with Novan generic. 12/09/20  Yes Collene Gobble, MD  fluticasone (FLONASE) 50 MCG/ACT nasal spray Place 2 sprays into the nose in the morning.   Yes [provider]  guaifenesin (HUMIBID E) 400 MG TABS tablet Take 400 mg by mouth daily.   Yes [provider]  Multiple Vitamin (MULTIVITAMIN WITH MINERALS) TABS tablet Take 1 tablet by mouth daily.   Yes [provider]  phenylephrine (SUDAFED PE) 10 MG TABS tablet Take 10 mg by mouth every 4 (four) hours as needed (sinus congestion.).   Yes [provider]  progesterone (PROMETRIUM) 200 MG capsule Take 1 capsule (200 mg total) by mouth at bedtime. 10/14/20  Yes Amundson Raliegh Ip, MD  albuterol (VENTOLIN HFA) 108 (90 Base) MCG/ACT inhaler INHALE 2 PUFFS INTO THE LUNGS EVERY 6 HOURS AS NEEDED FOR WHEEZING OR SHORTNESS OF BREATH 11/25/20   Marrian Salvage, FNP  LORazepam (ATIVAN) 0.5 MG tablet Recommend1 tablet p.o. 30 minutes before the MRI.  May repeat once if needed 12/16/20   Curt Bears, MD     Family History  Problem Relation Age of Onset   Hypertension Mother    Heart disease Mother    Cancer Mother        SKIN CANCER   Celiac disease Mother    Hypertension Father    Cancer Father        COLON   Heart disease Maternal Grandmother    Cancer Paternal Grandmother        Female cancer  ?type   Healthy Daughter    Colon cancer Neg Hx    Esophageal cancer Neg Hx  Rectal cancer Neg Hx    Stomach cancer Neg Hx     Social History   Socioeconomic History   Marital status: Married    Spouse name: Not on file   Number of children: 1   Years of education: Not on file   Highest education level: Not on file  Occupational History   Occupation: Freight forwarder  Tobacco Use   Smoking status: Never   Smokeless tobacco: Never  Vaping Use   Vaping Use: Never used  Substance and Sexual Activity   Alcohol use: Yes    Alcohol/week: 0.0 standard drinks    Comment: Rare   Drug use: No   Sexual activity: Yes    Birth control/protection: Surgical    Comment: Tubal lig-1st intercourse 60 yo-Fewer than 5 partners  Other Topics Concern   Not on file  Social History Narrative   Not on file   Social Determinants of Health   Financial Resource  Strain: Not on file  Food Insecurity: Not on file  Transportation Needs: Not on file  Physical Activity: Not on file  Stress: Not on file  Social Connections: Not on file    Review of Systems: A 12 point ROS discussed and pertinent positives are indicated in the HPI above.  All other systems are negative.  Review of Systems  Constitutional:  Negative for activity change and fever.  Respiratory:  Negative for cough and shortness of breath.   Cardiovascular:  Negative for chest pain.  Gastrointestinal:  Negative for abdominal pain.  Psychiatric/Behavioral:  Negative for behavioral problems and confusion.    Vital Signs: BP 126/67 (BP Location: Left Arm)   Pulse 84   Temp 97.9 F (36.6 C) (Oral)   Ht 5' 4"  (1.626 m)   Wt 161 lb (73 kg)   LMP 10/10/2011   SpO2 100%   BMI 27.64 kg/m   Physical Exam Vitals reviewed.  HENT:     Mouth/Throat:     Mouth: Mucous membranes are moist.  Cardiovascular:     Rate and Rhythm: Normal rate and regular rhythm.     Heart sounds: Normal heart sounds.  Pulmonary:     Effort: Pulmonary effort is normal.     Breath sounds: Normal breath sounds.  Abdominal:     Palpations: Abdomen is soft.  Musculoskeletal:        General: Normal range of motion.  Skin:    General: Skin is warm.  Neurological:     Mental Status: She is alert and oriented to person, place, and time.  Psychiatric:        Behavior: Behavior normal.    Imaging: DG Chest 2 View  Addendum Date: 12/02/2020   ADDENDUM REPORT: 12/02/2020 12:54 ADDENDUM: Additionally, there are separate discrete nodular foci seen in the LEFT lower lobe up to the 13 mm and in the RIGHT lower lobe of to 4 mm. These results will be called to the ordering clinician or representative by the Radiologist Assistant, and communication documented in the PACS or [employer]. Electronically Signed   By: Zetta Bills M.D.   On: 12/02/2020 12:54   Result Date: 12/02/2020 CLINICAL DATA:  A  60 year old female presents for pneumonia follow-up. EXAM: CHEST - 2 VIEW COMPARISON:  November 14, 2020. FINDINGS: Persistent opacities in the RIGHT mid chest in the RIGHT middle lobe and in the LEFT lung base. The show more pronounced convex margins than on the previous study. At the LEFT lung base measuring up to 3.7 cm and  in the RIGHT middle lobe up to 2.8 cm. Pulmonary neoplasm or metastatic disease would be difficult to exclude given this appearance. CT of the chest is suggested for further evaluation. On limited assessment there is no acute skeletal process. Cardiomediastinal contours and hilar structures are unchanged. No lobar consolidation or effusion. IMPRESSION: Ovoid nodular and masslike findings in the chest with more pronounced, defined appearance raising the question of pulmonary neoplasm. CT of the chest is suggested for further evaluation. Electronically Signed: By: Zetta Bills M.D. On: 12/02/2020 12:49   CT Angio Chest Pulmonary Embolism (PE) W or WO Contrast  Result Date: 12/02/2020 CLINICAL DATA:  Elevated D-dimer EXAM: CT ANGIOGRAPHY CHEST WITH CONTRAST TECHNIQUE: Multidetector CT imaging of the chest was performed using the standard protocol during bolus administration of intravenous contrast. Multiplanar CT image reconstructions and MIPs were obtained to evaluate the vascular anatomy. CONTRAST:  17m OMNIPAQUE IOHEXOL 350 MG/ML SOLN COMPARISON:  04/02/2020 FINDINGS: Cardiovascular: No filling defects in the pulmonary arteries to suggest pulmonary emboli. Heart is normal size. Aorta is normal caliber. Mediastinum/Nodes: No mediastinal, hilar, or axillary adenopathy. Trachea and esophagus are unremarkable. Thyroid unremarkable. Lungs/Pleura: Bilateral nodular densities seen. The largest on the left is in the lower lobe measuring 3.5 cm. The largest on the right is in the right middle lobe measuring 2.5 cm. Some areas contain air bronchograms or areas of cavitation. Others appear to  have vessels running through them. No effusions. Upper Abdomen: Imaging into the upper abdomen demonstrates no acute findings. Musculoskeletal: Chest wall soft tissues are unremarkable. No acute bony abnormality. Review of the MIP images confirms the above findings. IMPRESSION: No evidence of pulmonary embolus. Bilateral nodular opacities in both lungs measuring up to 3.5 cm. Given their appearance, favor septic emboli or other inflammatory/infectious process. However, follow-up is recommended after treatment to confirm resolution and to exclude metastases. Electronically Signed   By: KRolm BaptiseM.D.   On: 12/02/2020 18:37   MR BRAIN W WO CONTRAST  Result Date: 12/20/2020 CLINICAL DATA:  Non-small cell lung cancer, staging. EXAM: MRI HEAD WITHOUT AND WITH CONTRAST TECHNIQUE: Multiplanar, multiecho pulse sequences of the brain and surrounding structures were obtained without and with intravenous contrast. CONTRAST:  8.575mGADAVIST GADOBUTROL 1 MMOL/ML IV SOLN COMPARISON:  Head CT 03/05/2008.  Cervical spine MRI 07/08/2004. FINDINGS: Brain: There is no evidence of an acute infarct, intracranial hemorrhage, midline shift, or extra-axial fluid collection. A few punctate foci of T2 hyperintensity in the cerebral white matter bilaterally are nonspecific and not considered abnormal for age. The ventricles and sulci are normal. There is a 1 cm enhancing subcortical lesion in the left parieto-occipital region with mild surrounding edema (series 16, image 71). There is also a 4 mm enhancing lesion medially in the anterior right frontal lobe with mild surrounding edema (series 16, image 92). The pituitary gland is mildly enlarged for age, measuring 8 mm in height with a convex superior margin and 4 mm hypoenhancing region in the posterior aspect of the gland. The gland was similar in size on the 2006 cervical spine MRI. Vascular: Major intracranial vascular flow voids are preserved. Skull and upper cervical spine: No  suspicious marrow lesion. Sinuses/Orbits: Unremarkable orbits. Minimal mucosal thickening in the right sphenoid sinus. Clear mastoid air cells. Other: None. IMPRESSION: 1. Small right frontal and left parieto-occipital enhancing lesions. While not particularly masslike in appearance, these are still most concerning for metastatic disease given the presence of mild edema and known lung metastases. A nonneoplastic inflammatory process  is possible but considered less likely. 2. Mild enlargement of the pituitary gland, favored to be chronic and benign such as from an underlying microadenoma, cyst, or hyperplasia with a metastasis considered much less likely. Attention on follow-up. Electronically Signed   By: Logan Bores M.D.   On: 12/20/2020 10:45   NM PET Image Initial (PI) Skull Base To Thigh  Result Date: 12/12/2020 CLINICAL DATA:  Initial treatment strategy for lung cancer. EXAM: NUCLEAR MEDICINE PET SKULL BASE TO THIGH TECHNIQUE: 8.38 mCi F-18 FDG was injected intravenously. Full-ring PET imaging was performed from the skull base to thigh after the radiotracer. CT data was obtained and used for attenuation correction and anatomic localization. Fasting blood glucose: 92 mg/dl COMPARISON:  CT angio chest 12/02/20 FINDINGS: Mediastinal blood pool activity: SUV max 2.15 Liver activity: SUV max NA NECK: There is nonspecific asymmetric increased uptake within the right tonsillar region with an SUV max of 5.75. No FDG avid lymph nodes within the soft tissues of the neck. Incidental CT findings: none CHEST: No FDG avid axillary, supraclavicular, mediastinal, or hilar lymph nodes. Bilateral FDG avid pulmonary nodules are identified. Index nodules include: Right upper lobe lung nodule measures 2.2 cm within SUV max of 10.11, image 20/8. Lingular nodule measures 1.4 cm with SUV max of 7.62, image 36/8. Within the anterior left lung base nodule measures 3.3 cm with an SUV max of 7.73, image 43/8. Nodule within the  medial right lung base measures 1.9 cm with SUV max of 4.08 Right middle lobe lung non measures 2.3 cm with SUV max of 9.68, image 46/8 Incidental CT findings: none ABDOMEN/PELVIS: No abnormal hypermetabolic activity within the liver, pancreas, adrenal glands, or spleen. No hypermetabolic lymph nodes in the abdomen or pelvis. Incidental CT findings: Cholecystectomy. Posterior left kidney cyst measures 2.6 cm. The spleen measures 7.4 x 5.2 by 13.6 cm (volume = 270 cm^3) SKELETON: There is diffusely increased, heterogeneous radiotracer uptake is identified throughout the bone marrow. No dominant focus of increased radiotracer uptake identified highly specific for skeletal metastasis. On the corresponding CT images no suspicious bone lesions identified. Incidental CT findings: none IMPRESSION: 1. Bilateral FDG avid pulmonary nodules are identified compatible with metastatic disease. The dominant nodule is in the anterior left base measuring 3.3 cm. No FDG avid mediastinal or hilar lymph nodes. 2. No signs of nodal or solid organ metastasis within the abdomen or pelvis. 3. Diffusely increased heterogeneous radiotracer uptake is noted throughout the bone marrow. No corresponding lytic or sclerotic bone lesions identified on the CT images. Findings may be seen in the setting of anemia, bone marrow stimulating therapy, as well as diffuse bone marrow involvement by tumor Electronically Signed   By: Kerby Moors M.D.   On: 12/12/2020 09:46   DG Chest Port 1 View  Result Date: 12/09/2020 CLINICAL DATA:  Status post bronchoscopy. EXAM: PORTABLE CHEST 1 VIEW COMPARISON:  12/02/2020 FINDINGS: No evidence for pneumothorax or pleural effusion. Nodular density seen at the lung bases previously, left greater than right, are similar in the interval with subtle nodular opacity in the suprahilar right lung. The cardiopericardial silhouette is within normal limits for size. The visualized bony structures of the thorax show no  acute abnormality. Telemetry leads overlie the chest. IMPRESSION: 1. No pneumothorax or pleural effusion. 2. Stable bilateral pulmonary nodules. Electronically Signed   By: Misty Stanley M.D.   On: 12/09/2020 14:09   DG C-Arm 1-60 Min-No Report  Result Date: 12/09/2020 Fluoroscopy was utilized by the requesting physician.  No radiographic interpretation.   ECHOCARDIOGRAM COMPLETE  Result Date: 12/03/2020    ECHOCARDIOGRAM REPORT   Patient Name:   SPRING SAN Date of Exam: 12/03/2020 Medical Rec #:  [id number]       Height:       64.0 in Accession #:    [id number]      Weight:       172.2 lb Date of Birth:  [birth year]        BSA:          1.836 m Patient Age:    51 years        BP:           100/68 mmHg Patient Gender: F               HR:           78 bpm. Exam Location:  [address] Procedure: 2D Echo, 3D Echo, Cardiac Doppler, Color Doppler and Strain Analysis Indications:    r06.00 Dyspnea  History:        Patient has no prior history of Echocardiogram examinations.                 Signs/Symptoms:Shortness of Breath. History of COVID 19.  Sonographer:    Lenard Galloway BA, RDCS Referring Phys: Fonnie Mu PARRETT IMPRESSIONS  1. Left ventricular ejection fraction, by estimation, is 60 to 65%. The left ventricle has normal function. The left ventricle has no regional wall motion abnormalities. Left ventricular diastolic parameters were normal. The average left ventricular global longitudinal strain is -25.7 %. The global longitudinal strain is normal.  2. Right ventricular systolic function is normal. The right ventricular size is normal. Tricuspid regurgitation signal is inadequate for assessing PA pressure.  3. The mitral valve is normal in structure. No evidence of mitral valve regurgitation. No evidence of mitral stenosis.  4. The aortic valve is tricuspid. Aortic valve regurgitation is not visualized. No aortic stenosis is present.  5. The inferior vena cava is normal in size with greater than 50%  respiratory variability, suggesting right atrial pressure of 3 mmHg. FINDINGS  Left Ventricle: Left ventricular ejection fraction, by estimation, is 60 to 65%. The left ventricle has normal function. The left ventricle has no regional wall motion abnormalities. The average left ventricular global longitudinal strain is -25.7 %. The global longitudinal strain is normal. The left ventricular internal cavity size was normal in size. There is no left ventricular hypertrophy. Left ventricular diastolic parameters were normal. Right Ventricle: The right ventricular size is normal. No increase in right ventricular wall thickness. Right ventricular systolic function is normal. Tricuspid regurgitation signal is inadequate for assessing PA pressure. Left Atrium: Left atrial size was normal in size. Right Atrium: Right atrial size was normal in size. Pericardium: There is no evidence of pericardial effusion. Mitral Valve: The mitral valve is normal in structure. No evidence of mitral valve regurgitation. No evidence of mitral valve stenosis. Tricuspid Valve: The tricuspid valve is normal in structure. Tricuspid valve regurgitation is trivial. Aortic Valve: The aortic valve is tricuspid. Aortic valve regurgitation is not visualized. No aortic stenosis is present. Pulmonic Valve: The pulmonic valve was normal in structure. Pulmonic valve regurgitation is not visualized. Aorta: The aortic root is normal in size and structure. Venous: The inferior vena cava is normal in size with greater than 50% respiratory variability, suggesting right atrial pressure of 3 mmHg. IAS/Shunts: No atrial level shunt detected by color flow Doppler.  LEFT VENTRICLE PLAX 2D  LVIDd:         3.80 cm   Diastology LVIDs:         2.00 cm   LV e' medial:    9.46 cm/s LV PW:         0.70 cm   LV E/e' medial:  9.1 LV IVS:        0.60 cm   LV e' lateral:   15.40 cm/s LVOT diam:     1.90 cm   LV E/e' lateral: 5.6 LV SV:         73 LV SV Index:   40        2D  Longitudinal Strain LVOT Area:     2.84 cm  2D Strain GLS (A2C):   -27.7 %                          2D Strain GLS (A3C):   -24.5 %                          2D Strain GLS (A4C):   -24.9 %                          2D Strain GLS Avg:     -25.7 %                           3D Volume EF:                          3D EF:        61 %                          LV EDV:       90 ml                          LV ESV:       35 ml                          LV SV:        55 ml RIGHT VENTRICLE             IVC RV Basal diam:  3.10 cm     IVC diam: 1.60 cm RV Mid diam:    2.30 cm RV S prime:     13.20 cm/s TAPSE (M-mode): 2.4 cm RVSP:           12.4 mmHg LEFT ATRIUM         Index       RIGHT ATRIUM           Index LA diam:    3.00 cm 1.63 cm/m  RA Pressure: 3.00 mmHg                                 RA Area:     8.13 cm                                 RA Volume:   13.90 ml  7.57 ml/m  AORTIC VALVE LVOT Vmax:  121.00 cm/s LVOT Vmean:  80.900 cm/s LVOT VTI:    0.258 m  AORTA Ao Root diam: 2.90 cm Ao Asc diam:  2.90 cm MITRAL VALVE               TRICUSPID VALVE MV Area (PHT):             TR Peak grad:   9.4 mmHg MV Decel Time: 195 msec    TR Vmax:        153.00 cm/s MV E velocity: 85.90 cm/s  Estimated RAP:  3.00 mmHg MV A velocity: 87.50 cm/s  RVSP:           12.4 mmHg MV E/A ratio:  0.98                            SHUNTS                            Systemic VTI:  0.26 m                            Systemic Diam: 1.90 cm Dalton McleanMD Electronically signed by Franki Monte Signature Date/Time: 12/03/2020/5:01:16 PM    Final    DG C-ARM BRONCHOSCOPY  Result Date: 12/09/2020 C-ARM BRONCHOSCOPY: Fluoroscopy was utilized by the requesting physician.  No radiographic interpretation.    Labs:  CBC: Recent Labs    11/20/20 1429 12/12/20 1446 12/16/20 0849 12/23/20 0930  WBC 4.8 5.0 4.4 5.6  HGB 12.7 12.3 12.8 13.5  HCT 37.8 35.7* 36.9 40.7  PLT 241 249 247 239    COAGS: Recent Labs    12/23/20 0930  INR 1.0     BMP: Recent Labs    07/30/20 1517 11/20/20 1429 12/12/20 1446 12/16/20 0849  NA 139 140 140 139  K 4.0 3.9 3.4* 3.9  CL 105 108 109 109  CO2 26 27 23 22   GLUCOSE 91 109* 109* 100*  BUN 20 17 14 14   CALCIUM 8.8 9.1 8.7* 9.0  CREATININE 1.08 0.87 0.87 0.84  GFRNONAA  --  >60 >60 >60    LIVER FUNCTION TESTS: Recent Labs    07/30/20 1517 12/12/20 1446 12/16/20 0849  BILITOT 0.5 0.5 0.7  AST 23 16 17   ALT 15 12 11   ALKPHOS 63 68 58  PROT 6.5 6.9 7.0  ALBUMIN 4.0 4.0 4.0    TUMOR MARKERS: No results for input(s): AFPTM, CEA, CA199, CHROMGRNA in the last 8760 hours.  Assessment and Plan:  Newly dx sq cell carcinoma right lung Referred to Dr Julien Nordmann Asking for additional biopsy of another lesion-- (repeat biopsy of one of the other pulmonary nodules for confirmation of the tissue diagnosis and to rule out any heterogeneity in the tumor.)Left lung nodule for molecular studies Risks and benefits of CT guided lung nodule biopsy was discussed with the patient including, but not limited to bleeding, hemoptysis, respiratory failure requiring intubation, infection, pneumothorax requiring chest tube placement, stroke from air embolism or even death.  All of the patient's questions were answered and the patient is agreeable to proceed. Consent signed and in chart.    Thank you for this interesting consult.  I greatly enjoyed meeting JAYLINN HELLENBRAND and look forward to participating in their care.  A copy of this report was sent to the requesting provider on this date.  Electronically Signed: Olin Hauser  A Teresea Donley, PA-C 12/23/2020, 9:56 AM   I spent a total of    new OP - 30 minutes in face to face in clinical consultation, greater than 50% of which was counseling/coordinating care for left lung mass biopsy

## 2020-12-23 NOTE — Procedures (Signed)
  Procedure: CT guided LLL lung biopsy 18g x3 EBL:   minimal Complications:  none immediate  See full dictation in [facility].  Dillard Cannon MD Main # [phone] Pager  [phone] Mobile [phone]

## 2020-12-24 ENCOUNTER — Encounter: Payer: Self-pay | Admitting: *Deleted

## 2020-12-24 ENCOUNTER — Encounter: Payer: BC Managed Care – PPO | Admitting: Nutrition

## 2020-12-24 NOTE — Progress Notes (Signed)
Oncology Nurse Navigator Documentation  Oncology Nurse Navigator Flowsheets 12/24/2020 12/19/2020  Abnormal Finding Date - 12/02/2020  Confirmed Diagnosis Date 12/23/2020 12/09/2020  Diagnosis Status Pathology Pending Pending Molecular Studies  Navigator Follow Up Date: 12/27/2020 12/24/2020  Navigator Follow Up Reason: Pathology Radiology  Navigator Location [facility]  Navigator Encounter Type Pathology Review/I followed up on Phyllis Morgan's pathology report.  Report is pending at this time, will follow up in a few days.  Dr. Julien Nordmann is aware of her bx completed yesterday.   Other:  Patient Visit Type Other Other  Treatment Phase Other Other  Barriers/Navigation Needs Coordination of Care -  Interventions Coordination of Care None Required  Acuity Level 2-Minimal Needs (1-2 Barriers Identified) Level 1-No Barriers  Time Spent with Patient 15 15

## 2020-12-24 NOTE — Telephone Encounter (Signed)
Pt was made aware of cxr results from 10/13. Closing encounter.

## 2020-12-25 LAB — SURGICAL PATHOLOGY

## 2020-12-27 ENCOUNTER — Encounter: Payer: Self-pay | Admitting: *Deleted

## 2020-12-27 NOTE — Progress Notes (Signed)
Oncology Nurse Navigator Documentation  Oncology Nurse Navigator Flowsheets 12/27/2020 12/27/2020 12/24/2020 12/19/2020  Abnormal Finding Date - - - 12/02/2020  Confirmed Diagnosis Date - - 12/23/2020 12/09/2020  Diagnosis Status - Confirmed Diagnosis Complete Pathology Pending Pending Molecular Studies  Navigator Follow Up Date: - 12/31/2020 12/27/2020 12/24/2020  Navigator Follow Up Reason: - Follow-up Appointment Pathology Radiology  Navigation Complete Date: 12/27/2020 - - -  Post Navigation: Continue to Follow Patient? No - - -  Reason Not Navigating Patient: Seeking Care elsewhere - - -  Navigator Location [place]  Navigator Encounter Type Telephone Pathology Review Pathology Review Other:  Telephone Incoming Call;Outgoing Call - - -  Patient Visit Type - Other Other Other  Treatment Phase - Other Other Other  Barriers/Navigation Needs Coordination of Care/patient called and she is going to receive tx at [facility].  No need to navigate. I updated Dr. Julien Nordmann. I will reach out to [facility] thoracic navigator to see if they need any information from [place].  Coordination of Care Coordination of Care -  Interventions Coordination of Care Coordination of Care Coordination of Care None Required  Acuity Level 2-Minimal Needs (1-2 Barriers Identified) Level 2-Minimal Needs (1-2 Barriers Identified) Level 2-Minimal Needs (1-2 Barriers Identified) Level 1-No Barriers  Time Spent with Patient 30 30 15  15

## 2020-12-27 NOTE — Progress Notes (Signed)
Oncology Nurse Navigator Documentation  Oncology Nurse Navigator Flowsheets 12/27/2020 12/24/2020 12/19/2020  Abnormal Finding Date - - 12/02/2020  Confirmed Diagnosis Date - 12/23/2020 12/09/2020  Diagnosis Status Confirmed Diagnosis Complete Pathology Pending Pending Molecular Studies  Navigator Follow Up Date: 12/31/2020 12/27/2020 12/24/2020  Navigator Follow Up Reason: Follow-up Appointment Pathology Radiology  Navigator Location [place] [facility]  Navigator Encounter Type Pathology Review Pathology Review Other:  Patient Visit Type Other Other Other  Treatment Phase Other Other Other  Barriers/Navigation Needs Coordination of Care/I followed up with Dr. Julien Nordmann regarding Ms. Milian's pathology.  He states she is coming in early next week to go over.  No orders received.  Coordination of Care -  Interventions Coordination of Care Coordination of Care None Required  Acuity Level 2-Minimal Needs (1-2 Barriers Identified) Level 2-Minimal Needs (1-2 Barriers Identified) Level 1-No Barriers  Time Spent with Patient 30 15 15

## 2020-12-30 ENCOUNTER — Encounter: Payer: Self-pay | Admitting: *Deleted

## 2020-12-30 LAB — CULTURE, FUNGUS WITHOUT SMEAR

## 2020-12-30 NOTE — Progress Notes (Signed)
Oncology Nurse Navigator Documentation  Oncology Nurse Navigator Flowsheets 12/30/2020 12/27/2020 12/27/2020 12/24/2020 12/19/2020  Abnormal Finding Date - - - - 12/02/2020  Confirmed Diagnosis Date - - - 12/23/2020 12/09/2020  Diagnosis Status - - Confirmed Diagnosis Complete Pathology Pending Pending Molecular Studies  Navigator Follow Up Date: - - 12/31/2020 12/27/2020 12/24/2020  Navigator Follow Up Reason: - - Follow-up Appointment Pathology Radiology  Navigation Complete Date: - 12/27/2020 - - -  Post Navigation: Continue to Follow Patient? - No - - -  Reason Not Navigating Patient: - Seeking Care elsewhere - - -  Navigator Location [place]  Navigator Encounter Type Other: Telephone Pathology Review Pathology Review Other:  Telephone - Incoming Call;Outgoing Call - - -  Patient Visit Type - - Other Other Other  Treatment Phase Pre-Tx/Tx Discussion - Other Other Other  Barriers/Navigation Needs Coordination of Care Coordination of Care Coordination of Care Coordination of Care -  Interventions Coordination of Care Coordination of Care Coordination of Care Coordination of Care None Required  Acuity Level 2-Minimal Needs (1-2 Barriers Identified) Level 2-Minimal Needs (1-2 Barriers Identified) Level 2-Minimal Needs (1-2 Barriers Identified) Level 2-Minimal Needs (1-2 Barriers Identified) Level 1-No Barriers  Coordination of Care Other/I contacted [facility] on 11/25 to see what information they needed for Ms. Phyllis Morgan's referral.  Barnett Abu updated me she needs molecular results. I faxed and received an email from [place] that she has received them.   - - - -  Time Spent with Patient 60 30 30 15  15

## 2020-12-31 ENCOUNTER — Inpatient Hospital Stay: Payer: BC Managed Care – PPO | Admitting: Nutrition

## 2020-12-31 ENCOUNTER — Inpatient Hospital Stay: Payer: BC Managed Care – PPO | Admitting: Internal Medicine

## 2020-12-31 ENCOUNTER — Inpatient Hospital Stay: Payer: BC Managed Care – PPO

## 2021-01-08 ENCOUNTER — Encounter: Payer: Self-pay | Admitting: Obstetrics and Gynecology

## 2021-01-08 ENCOUNTER — Ambulatory Visit: Payer: BC Managed Care – PPO | Admitting: Rheumatology

## 2021-01-09 LAB — GUARDANT 360

## 2021-01-09 LAB — FUNGUS CULTURE WITH STAIN

## 2021-01-09 LAB — FUNGUS CULTURE RESULT

## 2021-01-09 LAB — FUNGAL ORGANISM REFLEX

## 2021-01-10 ENCOUNTER — Encounter: Payer: Self-pay | Admitting: *Deleted

## 2021-01-10 ENCOUNTER — Other Ambulatory Visit: Payer: Self-pay | Admitting: Family

## 2021-01-10 NOTE — Progress Notes (Signed)
Oncology Nurse Navigator Documentation  Oncology Nurse Navigator Flowsheets 01/10/2021 12/30/2020 12/27/2020 12/27/2020 12/24/2020 12/19/2020  Abnormal Finding Date - - - - - 12/02/2020  Confirmed Diagnosis Date - - - - 12/23/2020 12/09/2020  Diagnosis Status - - - Confirmed Diagnosis Complete Pathology Pending Pending Molecular Studies  Navigator Follow Up Date: - - - 12/31/2020 12/27/2020 12/24/2020  Navigator Follow Up Reason: - - - Follow-up Appointment Pathology Radiology  Navigation Complete Date: - - 12/27/2020 - - -  Post Navigation: Continue to Follow Patient? - - No - - -  Reason Not Navigating Patient: - - Seeking Care elsewhere - - -  Navigator Location [facility]  Navigator Encounter Type Telephone Other: Telephone Pathology Review Pathology Review Other:  Telephone Outgoing Call - Incoming Call;Outgoing Call - - -  Patient Visit Type - - - Other Other Other  Treatment Phase - Pre-Tx/Tx Discussion - Other Other Other  Barriers/Navigation Needs Education/I received a message from Ms. 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 I contacted [employer] regarding issue. She explained patient will be covered but to call their customer service. I called Ms. Willhelm and left her a vm message with an update and phone number to call.  Coordination of Care Coordination of Care Coordination of Care Coordination of Care -  Education Other - - - - -  Interventions Education Coordination of Care Coordination of Care Coordination of Care Coordination of Care None Required  Acuity Level 2-Minimal Needs (1-2 Barriers Identified) Level 2-Minimal Needs (1-2 Barriers Identified) Level 2-Minimal Needs (1-2 Barriers Identified) Level 2-Minimal Needs (1-2 Barriers Identified) Level 2-Minimal Needs (1-2 Barriers Identified) Level 1-No Barriers  Coordination of Care - Other - - - -  Time Spent with  Patient 15 60 30 30 15  15

## 2021-01-15 LAB — FUNGUS CULTURE WITH STAIN

## 2021-01-15 LAB — FUNGAL ORGANISM REFLEX

## 2021-01-15 LAB — FUNGUS CULTURE RESULT

## 2021-01-17 NOTE — Telephone Encounter (Signed)
Message routed to [place], NP and Dr. Lamonte Sakai as Juluis Rainier-  I thought you and Dr. Lamonte Sakai would be interested to learn I dont have cancer! After nearly starting chemo and almost having a gamma knife procedure, Dr. Mindi Junker and others at [facility] pumped the brakes twice for more testing. Since the original biopsies Dr. Lamonte Sakai initiated, Phyllis Morgan had a needle biopsy of one place in the left lung, another bronchoscopy during which all three places in the right lung were biopsied (including the one that originally was labeled as cancer), and a biopsy of a place on my brain. All of these came back as not cancer, but inflammation. Yall were right from the beginning!

## 2021-01-20 ENCOUNTER — Other Ambulatory Visit: Payer: BC Managed Care – PPO

## 2021-01-22 LAB — ACID FAST CULTURE WITH REFLEXED SENSITIVITIES (MYCOBACTERIA)
Acid Fast Culture: NEGATIVE
Acid Fast Culture: NEGATIVE
Acid Fast Culture: NEGATIVE
Acid Fast Culture: NEGATIVE

## 2021-02-07 ENCOUNTER — Encounter: Payer: Self-pay | Admitting: Family

## 2021-02-07 ENCOUNTER — Other Ambulatory Visit: Payer: Self-pay | Admitting: Family

## 2021-02-07 DIAGNOSIS — Z1211 Encounter for screening for malignant neoplasm of colon: Secondary | ICD-10-CM

## 2021-02-07 DIAGNOSIS — K219 Gastro-esophageal reflux disease without esophagitis: Secondary | ICD-10-CM

## 2021-02-07 DIAGNOSIS — Z86010 Personal history of colonic polyps: Secondary | ICD-10-CM

## 2021-02-09 ENCOUNTER — Encounter: Payer: Self-pay | Admitting: Gastroenterology

## 2021-02-11 ENCOUNTER — Other Ambulatory Visit: Payer: Self-pay | Admitting: Family

## 2021-02-11 ENCOUNTER — Encounter: Payer: Self-pay | Admitting: Family

## 2021-02-11 DIAGNOSIS — F419 Anxiety disorder, unspecified: Secondary | ICD-10-CM

## 2021-02-13 ENCOUNTER — Encounter (HOSPITAL_COMMUNITY): Payer: Self-pay

## 2021-02-19 ENCOUNTER — Ambulatory Visit (INDEPENDENT_AMBULATORY_CARE_PROVIDER_SITE_OTHER): Payer: BC Managed Care – PPO | Admitting: Professional

## 2021-02-19 ENCOUNTER — Encounter: Payer: Self-pay | Admitting: Professional

## 2021-02-19 DIAGNOSIS — F411 Generalized anxiety disorder: Secondary | ICD-10-CM | POA: Diagnosis not present

## 2021-02-19 NOTE — Progress Notes (Signed)
° ° ° ° ° ° ° ° ° ° ° ° ° ° °  Arpan Eskelson, LCMHC °

## 2021-02-19 NOTE — Progress Notes (Signed)
Counselor Initial Adult Exam  Name: Phyllis Morgan Date: 02/19/2021 MRN: [id number] DOB: [birth year] PCP: Marrian Salvage, FNP  Time spent: 53 minutes 1-153 pm  Guardian/Payee:  self   Paperwork requested: Yes , Myra Gianotti  Reason for Visit /Presenting Problem: anxiety, emotions around health issues  Mental Status Exam: Appearance:   Casual     Behavior:  Appropriate and Sharing  Motor:  Normal  Speech/Language:   Clear and Coherent and Normal Rate  Affect:  Full Range  Mood:  normal  Thought process:  goal directed  Thought content:    WNL  Sensory/Perceptual disturbances:    WNL  Orientation:  oriented to person, place, time/date, and situation  Attention:  Good  Concentration:  Good  Memory:  WNL  Fund of knowledge:   Good  Insight:    Good  Judgment:   Good  Impulse Control:  Good   Risk Assessment: Danger to Self:  No Self-injurious Behavior: No Danger to Others: No Duty to Warn:no Physical Aggression / Violence:No  Access to Firearms a concern: Yes  but has no idea how to use it "I would never touch it" Gang Involvement:No  Patient / guardian was educated about steps to take if suicide or homicide risk level increases between visits: n/a While future psychiatric events cannot be accurately predicted, the patient does not currently require acute inpatient psychiatric care and does not currently meet [facility] involuntary commitment criteria.  Substance Abuse History: Current substance abuse:  will have a fruity drink about one time per year. She has never consumed more than one time per year.     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 Victim of  Neglect:No. Perpetrator of  n/a   Witness / Exposure to Domestic Violence: No   Protective Services Involvement: No  Witness to [employer] Violence:  No   Family History:  Family History  Problem Relation Age of Onset   Hypertension Mother    Heart disease Mother    Cancer Mother        SKIN CANCER   Celiac disease Mother    Hypertension Father    Cancer Father        COLON   Heart disease Maternal Grandmother    Cancer Paternal Grandmother        Female cancer  ?type   Healthy Daughter    Colon cancer Neg Hx    Esophageal cancer Neg Hx    Rectal cancer Neg Hx    Stomach cancer Neg Hx     Living situation: the patient lives with their spouse  Sexual Orientation: Straight  Relationship Status: married  Name of spouse / other:Jerry married for 39 years, 46 in June 2023 If a parent, number of children / ages: one daughter Jerene Pitch ~7 and has been visiting for nine weeks from her home in [place] 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 In October, the surgeon had her get clearance from a pulmonologist for clearance. She had a chest x-ray from left over pneumonia. Her x-ray worsened several weeks later and ultimately had to have a biopsy. She did not have cancer but did have inflammation despite having five spots in lungs. She was diagnosed with stage 4 lung cancer and on PET scan had spot on brain.  Oncologist put the breaks on chemotherapy after recent studies did not show cancer. She ended up having a brain cancer. They now are questioning th potential for an autoimmune disease. Has MRI and CT scan to see if they are getting better. She will also be seeing a rheumatologist.  Patient is appropriately concerned though admits she does carry ome guilt that other people have not been so lucky.  Strengths: Supportive Relationships, Family, Friends, Spirituality, Hopefulness, and Able to Communicate Effectively  Barriers:  still does not have answer for what is happening medically   Legal History: Pending legal issue / charges: The patient has no significant history of legal issues. History of legal issue / charges:  n/a  Medical History/Surgical History: reviewed Past Medical History:  Diagnosis Date   Asthma    exercise induced   Biliary dyskinesia    Celiac disease    Celiac disease 2013   Colon polyps    Dyspnea    Dysrhythmia    PVCs   Gastric polyps    GERD (gastroesophageal reflux disease)    Headache    History of kidney stones    IDA (iron deficiency anemia)    Osteopenia 11/2018   T score -1.3 FRAX 6.5% / 0.4% stable from prior DEXA   Pneumonia    as a child   PONV (postoperative nausea and vomiting)    Seasonal allergies     Past Surgical History:  Procedure Laterality Date   BRONCHIAL BIOPSY  12/09/2020   Procedure: BRONCHIAL BIOPSIES;  Surgeon: Collene Gobble, MD;  Location: [facility] ENDOSCOPY;  Service: Pulmonary;;   BRONCHIAL BRUSHINGS  12/09/2020   Procedure:  BRONCHIAL BRUSHINGS;  Surgeon: Collene Gobble, MD;  Location: [facility] ENDOSCOPY;  Service: Pulmonary;;   BRONCHIAL NEEDLE ASPIRATION BIOPSY  12/09/2020   Procedure: BRONCHIAL NEEDLE ASPIRATION BIOPSIES;  Surgeon: Collene Gobble, MD;  Location: [facility] ENDOSCOPY;  Service: Pulmonary;;   BRONCHIAL WASHINGS  12/09/2020   Procedure: BRONCHIAL WASHINGS;  Surgeon: Collene Gobble, MD;  Location: [facility] ENDOSCOPY;  Service: Pulmonary;;   CESAREAN SECTION     CHOLECYSTECTOMY  2007   ENDOMETRIAL ABLATION  12/21/2006   HER OPTION CRYOABLATION    HAND SURGERY     THUMB/RIGHT HAND   KNEE ARTHROSCOPY Left 11/26/2020   Procedure: LEFT KNEE ARTHROSCOPY, PARTIAL MEDIAL MENISECTOMY;  Surgeon: Melrose Nakayama, MD;  Location: [facility] ORS;  Service: Orthopedics;  Laterality: Left;   KNEE SURGERY Right 2019   Left eye retinal tear  12/18/2018   [facility]  OVARIAN CYST REMOVAL Right 1993   Rt Ovarian Cystectomy   PELVIC LAPAROSCOPY     OVARIAN CYSTECTOMY   TUBAL LIGATION  12/2002   FALLOPE RINGS   UMBILICAL HERNIA REPAIR     VIDEO BRONCHOSCOPY WITH ENDOBRONCHIAL NAVIGATION N/A 12/09/2020   Procedure: VIDEO BRONCHOSCOPY WITH ENDOBRONCHIAL NAVIGATION;  Surgeon: Collene Gobble, MD;  Location: [place] ENDOSCOPY;  Service: Pulmonary;  Laterality: N/A;    Medications: Current Outpatient Medications  Medication Sig Dispense Refill   acetaminophen (TYLENOL) 500 MG tablet Take 1,000 mg by mouth every 6 (six) hours as needed (for pain/headaches.).     albuterol (VENTOLIN HFA) 108 (90 Base) MCG/ACT inhaler INHALE 2 PUFFS INTO THE LUNGS EVERY 6 HOURS AS NEEDED FOR WHEEZING OR SHORTNESS OF BREATH 54 g 0   cetirizine (ZYRTEC) 10 MG tablet Take 10 mg by mouth daily.     cholecalciferol (VITAMIN D) 25 MCG (1000 UNIT) tablet Take 1,000 Units by mouth daily.     dexlansoprazole (DEXILANT) 60 MG capsule TAKE 1 CAPSULE BY MOUTH EVERY MORNING 90 capsule 4   diphenhydrAMINE (BENADRYL) 25 MG tablet Take 25 mg by mouth every 6  (six) hours as needed.     estradiol (VIVELLE-DOT) 0.1 MG/24HR patch Place 1 patch (0.1 mg total) onto the skin 2 (two) times a week. (Wednesdays & Saturdays) Place one patch onto the skin twice weekly. Pls fill with Novan generic.     fluticasone (FLONASE) 50 MCG/ACT nasal spray Place 2 sprays into the nose in the morning.     guaifenesin (HUMIBID E) 400 MG TABS tablet Take 400 mg by mouth daily.     LORazepam (ATIVAN) 0.5 MG tablet Recommend1 tablet p.o. 30 minutes before the MRI.  May repeat once if needed 2 tablet 0   Multiple Vitamin (MULTIVITAMIN WITH MINERALS) TABS tablet Take 1 tablet by mouth daily.     phenylephrine (SUDAFED PE) 10 MG TABS tablet Take 10 mg by mouth every 4 (four) hours as needed (sinus congestion.).     progesterone (PROMETRIUM) 200 MG capsule Take 1 capsule (200 mg total) by mouth at bedtime. 90 capsule 3   No current facility-administered medications for this visit.    Allergies  Allergen Reactions   Avelox [Moxifloxacin Hcl In Nacl] Nausea Only   Other     PT has Celiac disease    Sulfa Antibiotics Other (See Comments)    Blurred vision    Diagnoses:  Generalized anxiety disorder  Plan of Care:  -struggles with anxiety related to germs -feels overwhelmed with unknowns related to health issues -"I wish that I could move more past that (Covid)" -meet again on Tuesday, March 04, 2021 at [place]

## 2021-03-03 ENCOUNTER — Telehealth: Payer: Self-pay

## 2021-03-03 NOTE — Telephone Encounter (Signed)
Prior Authorization has been started for Dexilant through Cover My Meds.

## 2021-03-04 ENCOUNTER — Encounter: Payer: Self-pay | Admitting: Professional

## 2021-03-04 ENCOUNTER — Ambulatory Visit (INDEPENDENT_AMBULATORY_CARE_PROVIDER_SITE_OTHER): Payer: BC Managed Care – PPO | Admitting: Professional

## 2021-03-04 DIAGNOSIS — F411 Generalized anxiety disorder: Secondary | ICD-10-CM | POA: Diagnosis not present

## 2021-03-04 NOTE — Progress Notes (Signed)
[facility] Counselor/Therapist Progress Note  Patient ID: Phyllis Morgan, MRN: [id number],    Date: 03/04/2021  Time Spent: 41 minutes 1-142 pm   Treatment Type: Individual Therapy  Reported Symptoms: anxious, unsettled, engaged  Mental Status Exam: Appearance:  Neat     Behavior: Appropriate and Sharing  Motor: Normal  Speech/Language:  Clear and Coherent and Normal Rate  Affect: Full Range  Mood: anxious  Thought process: goal directed  Thought content:   WNL  Sensory/Perceptual disturbances:   WNL  Orientation: oriented to person, place, time/date, and situation  Attention: Good  Concentration: Good  Memory: WNL  Fund of knowledge:  Good  Insight:   Good  Judgment:  Good  Impulse Control: Good   Risk Assessment: Danger to Self:  No Self-injurious Behavior: No Danger to Others: No Duty to Warn:no Physical Aggression / Violence:No  Access to Firearms a concern: No  Gang Involvement:No   Subjective: This session was held via video teletherapy due to the coronavirus risk at this time. The patient consented to video teletherapy and was located at her home during this session. She is aware it is the responsibility of the patient to secure confidentiality on her end of the session. The provider was in a private home office for the duration of this session.   The patient arrived on time for her webex appointment appearing nicely groomed and easily engaged  Issues addressed: 1-     -how to manage anxiety   -she prays about it   -she goes out and about regardless   -has began playing handbells at church   -getting together with friends some -medically   -feels a little less vulnerable since the MD discontinued -pt does question if she is doing a good job at coping -"I have always been good at compartmentalizing" -taking control of the things you have the ability to control   -strength training and cardio   -change diet   Homework -focus on food  and exercise piece  Treatment Plan Client Abilities/Strengths: Pt is bright, engaging and motivated for therapy.  Client Treatment Preferences: bi-weekly Individual therapy.  Client Statement of Needs: -work through the feelings related to all the health issues      -stressed about what lies ahead and is there still some cancer there      -I need a way to cope with all of that      -Covid        -still worries about getting Covid because that is where the issues began Symptoms  Autonomic hyperactivity (e.g., palpitations, shortness of breath, dry mouth, trouble swallowing, nausea, diarrhea). Excessive and/or unrealistic worry that is difficult to control occurring more days than not for at least 6 months about a number of events or activities. Hypervigilance (e.g., feeling constantly on edge, experiencing concentration difficulties, having trouble falling or staying asleep, exhibiting a general state of irritability). Motor tension (e.g., restlessness, tiredness, shakiness, muscle tension). Problems Addressed: Anxiety Goals 1. Enhance ability to effectively cope with the full variety of life's worries and anxieties. 2. Learn and implement coping skills that result in a reduction of anxiety and worry, and improved daily functioning.  Objective Learn to accept limitations in life and commit to tolerating, rather than avoiding, unpleasant emotions while accomplishing meaningful goals. Target Date: 2022-03-03  Frequency: Biweekly Progress: 0 Modality: individual  Related Interventions 1.Use techniques from Acceptance and Commitment Therapy to help client accept uncomfortable realities such as lack of complete control, imperfections, and  uncertainty and tolerate unpleasant emotions and thoughts in order to accomplish value-consistent goals.  Objective Learn and implement problem-solving strategies for realistically addressing worries. Target Date: 2022-03-03  Frequency: Biweekly Progress:  0 Modality: individual  Related Interventions 1.Assign the client a homework exercise in which he/she problem-solves a current problem.  review, reinforce success, and provide corrective feedback toward improvement. 2.Teach the client problem-solving strategies involving specifically defining a problem, generating options for addressing it, evaluating the pros and cons of each option, selecting and implementing an optional action, and reevaluating and refining the action.  Objective Learn and implement calming skills to reduce overall anxiety and manage anxiety symptoms. Target Date: 2022-03-03  Frequency: Biweekly Progress: 0 Modality: individual  Related Interventions 1.Assign the client to read about progressive muscle relaxation and other calming strategies in relevant books or treatment manuals (e.g., Progressive Relaxation Training by Gwynneth Aliment and Dani Gobble; Mastery of Your Anxiety and Worry: Workbook by Beckie Busing). 2.Assign the client homework each session in which he/she practices relaxation exercises daily, gradually applying them progressively from non-anxiety-provoking to anxiety-provoking situations; review and reinforce success while providing corrective feedback toward improvement. 3.Teach the client calming/relaxation skills (e.g., applied relaxation, progressive muscle relaxation, cue controlled relaxation; mindful breathing; biofeedback) and how to discriminate better between relaxation and tension; teach the client how to apply these skills to his/her daily life. 3.Reduce overall frequency, intensity, and duration of the anxiety so that daily functioning is not impaired. 4.Resolve the core conflict that is the source of anxiety. 5.Stabilize anxiety level while increasing ability to function on a daily basis.  Diagnosis :    F41.1  Generalized Anxiety Disorder  Conditions For Discharge: Achievement of treatment goals and objectives.  Diagnosis:Generalized anxiety  disorder  Plan:  -pt will begin investing in improving her nutrition and exercise. -meet again on Tuesday, March 18, 2021 at [place], [facility]

## 2021-03-04 NOTE — Telephone Encounter (Signed)
Patient has been approved starting 03/03/2021 until 03/03/2022

## 2021-03-04 NOTE — Progress Notes (Signed)
-  work through the feelings related to all the health issues -stressed about what lies ahead and is there still some cancer there   -I need a way to cope with all of that -Covid   -still worries about getting Covid because that is where the issues began  -how to manage anxiety   -she prays about it   -she goes out and about regardless   -has began playing handbells at church   -getting together with friends some -medically   -feels a little less vulnerable since the MD discontinued -pt does question if she is doing a good job at coping -"I have always been good at compartmentalizing" -taking control of the things you have the ability to control   -strength training and cardio   -change diet  Homework -focus on food and exercise piece          Francie Massing, [facility]

## 2021-03-18 ENCOUNTER — Ambulatory Visit: Payer: BC Managed Care – PPO | Admitting: Professional

## 2021-03-18 ENCOUNTER — Encounter: Payer: Self-pay | Admitting: Professional

## 2021-03-18 DIAGNOSIS — F411 Generalized anxiety disorder: Secondary | ICD-10-CM

## 2021-03-18 NOTE — Progress Notes (Signed)
[facility] [facility] Counselor/Therapist Progress Note  Patient ID: Phyllis Morgan, MRN: [id number],    Date: 03/18/2021  Time Spent: 37 minutes 101-138 pm   Treatment Type: Individual Therapy  Reported Symptoms: calm  Mental Status Exam: Appearance:  Neat     Behavior: Appropriate and Sharing  Motor: Normal  Speech/Language:  Clear and Coherent and Normal Rate  Affect: Full Range  Mood: anxious  Thought process: goal directed  Thought content:   WNL  Sensory/Perceptual disturbances:   WNL  Orientation: oriented to person, place, time/date, and situation  Attention: Good  Concentration: Good  Memory: WNL  Fund of knowledge:  Good  Insight:   Good  Judgment:  Good  Impulse Control: Good   Risk Assessment: Danger to Self:  No Self-injurious Behavior: No Danger to Others: No Duty to Warn:no Physical Aggression / Violence:No  Access to Firearms a concern: No  Gang Involvement:No   Subjective: This session was held via video teletherapy due to the coronavirus risk at this time. The patient consented to video teletherapy and was located at her home during this session. She is aware it is the responsibility of the patient to secure confidentiality on her end of the session. The provider was in a private home office for the duration of this session.   The patient arrived on time for her webex appointment appearing nicely groomed and easily engaged.  Issues addressed: 1-physical -has some new nodules -pulmonary specialist said he does not have any answers   -he suspects the rheumatologist will have answers   -pt agrees with pulmonologist -pt admits that she was disappointed that the MD's did not have any  -she now has a pinched nerve and cannot get comfortable -is having issues with the knees -has been walking regularly   -walked today thirty minutes 2-anxiety -getting the thoughts -how to use the "god box" -get thoughts out onto paper instead of keeping in  mind -pt permits herself to cry -how to become invested in being active 3-nutrition/exercise -pt has been trying to eat better -she is also walking again -non-scale victories (nsv)  Treatment Plan Problems Addressed: Anxiety Goals 1. Enhance ability to effectively cope with the full variety of life's worries and anxieties. 2. Learn and implement coping skills that result in a reduction of anxiety and worry, and improved daily functioning. Objective Learn to accept limitations in life and commit to tolerating, rather than avoiding, unpleasant emotions while accomplishing meaningful goals. Target Date: 2022-03-03  Frequency: Biweekly Progress: 0 Modality: individual  Related Interventions 1.Use techniques from Acceptance and Commitment Therapy to help client accept uncomfortable realities such as lack of complete control, imperfections, and uncertainty and tolerate unpleasant emotions and thoughts in order to accomplish value-consistent goals.  Objective Learn and implement problem-solving strategies for realistically addressing worries. Target Date: 2022-03-03  Frequency: Biweekly Progress: 0 Modality: individual  Related Interventions 1.Assign the client a homework exercise in which he/she problem-solves a current problem.  review, reinforce success, and provide corrective feedback toward improvement. 2.Teach the client problem-solving strategies involving specifically defining a problem, generating options for addressing it, evaluating the pros and cons of each option, selecting and implementing an optional action, and reevaluating and refining the action.  Objective Learn and implement calming skills to reduce overall anxiety and manage anxiety symptoms. Target Date: 2022-03-03  Frequency: Biweekly Progress: 0 Modality: individual  Related Interventions 1.Assign the client to read about progressive muscle relaxation and other calming strategies in relevant books or treatment  manuals (e.g.,  Progressive Relaxation Training by Leroy Kennedy; Mastery of Your Anxiety and Worry: Workbook by Beckie Busing). 2.Assign the client homework each session in which he/she practices relaxation exercises daily, gradually applying them progressively from non-anxiety-provoking to anxiety-provoking situations; review and reinforce success while providing corrective feedback toward improvement. 3.Teach the client calming/relaxation skills (e.g., applied relaxation, progressive muscle relaxation, cue controlled relaxation; mindful breathing; biofeedback) and how to discriminate better between relaxation and tension; teach the client how to apply these skills to his/her daily life. 3.Reduce overall frequency, intensity, and duration of the anxiety so that daily functioning is not impaired. 4.Resolve the core conflict that is the source of anxiety. 5.Stabilize anxiety level while increasing ability to function on a daily basis.  Diagnosis:Generalized anxiety disorder  Plan:  -pt will begin investing in improving her nutrition and exercise. -meet again on Thursday, April 03, 2021 at [place], [facility]

## 2021-04-03 ENCOUNTER — Ambulatory Visit (INDEPENDENT_AMBULATORY_CARE_PROVIDER_SITE_OTHER): Payer: BC Managed Care – PPO | Admitting: Professional

## 2021-04-03 ENCOUNTER — Encounter: Payer: Self-pay | Admitting: Professional

## 2021-04-03 DIAGNOSIS — F411 Generalized anxiety disorder: Secondary | ICD-10-CM

## 2021-04-03 NOTE — Progress Notes (Signed)
[facility] Counselor/Therapist Progress Note ? ?Patient ID: Phyllis Morgan, MRN: [id number],   ? ?Date: 04/03/2021 ? ?Time Spent: 48 minutes 1-148 pm  ? ?Treatment Type: Individual Therapy ? ?Reported Symptoms: calm ? ?Mental Status Exam: ?Appearance:  Neat     ?Behavior: Appropriate and Sharing  ?Motor: Normal  ?Speech/Language:  Clear and Coherent and Normal Rate  ?Affect: Full Range  ?Mood: anxious  ?Thought process: goal directed  ?Thought content:   WNL  ?Sensory/Perceptual disturbances:   WNL  ?Orientation: oriented to person, place, time/date, and situation  ?Attention: Good  ?Concentration: Good  ?Memory: WNL  ?Fund of knowledge:  Good  ?Insight:   Good  ?Judgment:  Good  ?Impulse Control: Good  ? ?Risk Assessment: ?Danger to Self:  No ?Self-injurious Behavior: No ?Danger to Others: No ?Duty to Warn:no ?Physical Aggression / Violence:No  ?Access to Firearms a concern: No  ?Gang Involvement:No  ? ?Subjective: This session was held via video teletherapy due to the coronavirus risk at this time. The patient consented to video teletherapy and was located at her home during this session. She is aware it is the responsibility of the patient to secure confidentiality on her end of the session. The provider was in a private home office for the duration of this session.  ? ?The patient arrived on time for her webex appointment appearing nicely groomed and easily engaged. ? ?Issues addressed: ?1-homework- done ?-pt will begin investing in improving her nutrition and exercise ?2-nutrition/exercise ?-meal prep ?-has been walking regularly ?-pt reports she has ramped up her exercise ?-pt has cut hack on the amount of food ?-wants to engage meal planning ?3-physical ?-she saw the rheumatologist who did not have answers ?  -she will be referred to neurology, ?-pt discussed possible referral to [facility] or [facility] with rheumatologist ?4-anxiety ?-not masking at work event because she does not ant to be the only  one ?-pros/cons of masking ?-anticipatory anxiety ?  -what is it ? -how to manage ?-how to be in control ?5-notable progress ?-reviewed measurable objectives ?-completed PHQ-9/GAD-7 ?6-next steps ?-move therapy to every three weeks ?-pt informed of how to access session if needed ? ?Treatment Plan ?Problems Addressed: Anxiety ?Goals ?1. Enhance ability to effectively cope with the full variety of life's worries and anxieties. ?2. Learn and implement coping skills that result in a reduction of anxiety and worry, and improved daily functioning. ?Objective ?Learn to accept limitations in life and commit to tolerating, rather than avoiding, unpleasant emotions while accomplishing meaningful goals. ?Target Date: 2022-03-03  Frequency: Biweekly ?Progress: 80 Modality: individual ? ?Related Interventions ?1.Use techniques from Acceptance and Commitment Therapy to help client accept uncomfortable realities such as lack of complete control, imperfections, and uncertainty and tolerate unpleasant emotions and thoughts in order to accomplish value-consistent goals. ? ?Objective ?Learn and implement problem-solving strategies for realistically addressing worries. ?Target Date: 2022-03-03  Frequency: Biweekly ?Progress: 80 Modality: individual ? ?Related Interventions ?1.Assign the client a homework exercise in which he/she problem-solves a current problem.  review, reinforce success, and provide corrective feedback toward improvement. ?2.Teach the client problem-solving strategies involving specifically defining a problem, generating options for addressing it, evaluating the pros and cons of each option, selecting and implementing an optional action, and reevaluating and refining the action. ? ?Objective ?Learn and implement calming skills to reduce overall anxiety and manage anxiety symptoms. ?Target Date: 2022-03-03  Frequency: Biweekly ?Progress: 80 Modality: individual ? ?Related Interventions ?1.Assign the client to read  about progressive muscle relaxation  and other calming strategies in relevant books or treatment manuals (e.g., Progressive Relaxation Training by Leroy Kennedy; Mastery of Your Anxiety and Worry: Workbook by Beckie Busing). ?2.Assign the client homework each session in which he/she practices relaxation exercises daily, gradually applying them progressively from non-anxiety-provoking to anxiety-provoking situations; review and reinforce success while providing corrective feedback toward improvement. ?3.Teach the client calming/relaxation skills (e.g., applied relaxation, progressive muscle relaxation, cue controlled relaxation; mindful breathing; biofeedback) and how to discriminate better between relaxation and tension; teach the client how to apply these skills to his/her daily life. ?3.Reduce overall frequency, intensity, and duration of the anxiety so that daily functioning is not impaired. ?4.Resolve the core conflict that is the source of anxiety. ?5.Stabilize anxiety level while increasing ability to function on a daily basis. ? ?Diagnosis:Generalized anxiety disorder ? ?Plan:  ?-move to every three week visits. ?-meet again on Wednesday, April 23, 2021 at 1pm. ? ?Francie Massing, [facility] ?

## 2021-04-15 ENCOUNTER — Ambulatory Visit: Payer: BC Managed Care – PPO | Admitting: Professional

## 2021-04-23 ENCOUNTER — Ambulatory Visit: Payer: BC Managed Care – PPO | Admitting: Professional

## 2021-04-29 ENCOUNTER — Ambulatory Visit: Payer: BC Managed Care – PPO | Admitting: Professional

## 2021-05-13 ENCOUNTER — Ambulatory Visit: Payer: BC Managed Care – PPO | Admitting: Professional

## 2021-05-27 ENCOUNTER — Ambulatory Visit: Payer: BC Managed Care – PPO | Admitting: Professional

## 2021-05-30 ENCOUNTER — Telehealth: Payer: Self-pay | Admitting: Family

## 2021-05-30 NOTE — Telephone Encounter (Signed)
I have called pt to let her know we have not seen her in over 6 months and she is due for an office visit. She will need to be seen in order for [place] to be able to sign off on any orders for PT.  ? ?FYI ?

## 2021-05-30 NOTE — Telephone Encounter (Signed)
Caller/Agency: [facility] ?Callback Number: [phone] ?Requesting OT/PT/Skilled Nursing/Social Work/Speech Therapy: PT ?Frequency: 1x for 1 week then ?2x for 8 weeks  ? ?

## 2021-06-02 ENCOUNTER — Ambulatory Visit: Payer: BC Managed Care – PPO | Admitting: Professional

## 2021-06-06 ENCOUNTER — Encounter: Payer: Self-pay | Admitting: Family

## 2021-06-06 LAB — COLOGUARD: Cologuard: NEGATIVE

## 2021-06-10 ENCOUNTER — Encounter: Payer: Self-pay | Admitting: Family

## 2021-06-24 ENCOUNTER — Telehealth: Payer: Self-pay

## 2021-06-24 NOTE — Telephone Encounter (Signed)
Pt calling to inquire on how she should quit taking HRTs. 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 Inquiring if she should stop both at the same time cold [place] or if there was another way you would advise? Thanks.

## 2021-06-24 NOTE — Telephone Encounter (Signed)
I recommend she stop the estradiol and the Prometrium all at once.  If she is at risk for blood clots, she will need to stop all of her hormones.  Hormone therapy in any dosage increases the risk of blood clots.   With stopping her hormones, she may experience some rebound hot flashes, which are sometimes managed with nonhormonal medication.  If she has a difficult time with stopping her hormones, please have her make an appointment with me.

## 2021-06-26 NOTE — Telephone Encounter (Signed)
Thank you for the update.  Encounter reviewed and closed.

## 2021-06-26 NOTE — Telephone Encounter (Signed)
FYI. Left detailed msg per DPR.

## 2021-07-01 NOTE — Telephone Encounter (Signed)
Will forward to Tammy as FYI.

## 2021-07-04 NOTE — Telephone Encounter (Signed)
Spoke to her , discussed case in detail .

## 2021-07-15 ENCOUNTER — Ambulatory Visit: Payer: BC Managed Care – PPO | Admitting: Professional

## 2021-07-16 ENCOUNTER — Telehealth: Payer: Self-pay | Admitting: *Deleted

## 2021-07-16 NOTE — Telephone Encounter (Signed)
I received a call from Ms. Tesfaye. She is not getting tx here at [facility] but she did get a molecular test done.  She is having trouble with her insurance and coverage for this test. I called and spoke to her today. I gave her the phone number of the rep for [employer] where the moleculars were completed.

## 2021-07-25 ENCOUNTER — Encounter: Payer: Self-pay | Admitting: *Deleted

## 2021-12-28 ENCOUNTER — Telehealth: Payer: BC Managed Care – PPO | Admitting: Nurse Practitioner

## 2021-12-28 DIAGNOSIS — B9689 Other specified bacterial agents as the cause of diseases classified elsewhere: Secondary | ICD-10-CM

## 2021-12-28 DIAGNOSIS — J329 Chronic sinusitis, unspecified: Secondary | ICD-10-CM

## 2021-12-28 DIAGNOSIS — B3731 Acute candidiasis of vulva and vagina: Secondary | ICD-10-CM

## 2021-12-28 MED ORDER — FLUCONAZOLE 150 MG PO TABS: 150.0000 mg | ORAL_TABLET | ORAL | 0 refills | Status: AC

## 2021-12-28 MED ORDER — AMOXICILLIN-POT CLAVULANATE 875-125 MG PO TABS: 1.0000 | ORAL_TABLET | Freq: Two times a day (BID) | ORAL | 0 refills | Status: AC

## 2021-12-28 NOTE — Patient Instructions (Signed)
[name], thank you for joining Gildardo Pounds, NP for today's virtual visit.  While this provider is not your primary care provider (PCP), if your PCP is located in our provider database this encounter information will be shared with them immediately following your visit.   [facility] account gives you access to today's visit and all your visits, tests, and labs performed at [facility] " click here if you don't have a [facility] account or go to [email or web address]  Consent: (Patient) Phyllis Morgan provided verbal consent for this virtual visit at the beginning of the encounter.  Current Medications:  Current Outpatient Medications:    amoxicillin-clavulanate (AUGMENTIN) 875-125 MG tablet, Take 1 tablet by mouth 2 (two) times daily for 7 days., Disp: 14 tablet, Rfl: 0   fluconazole (DIFLUCAN) 150 MG tablet, Take 1 tablet (150 mg total) by mouth every 3 (three) days for 9 days., Disp: 3 tablet, Rfl: 0   acetaminophen (TYLENOL) 500 MG tablet, Take 1,000 mg by mouth every 6 (six) hours as needed (for pain/headaches.)., Disp: , Rfl:    albuterol (VENTOLIN HFA) 108 (90 Base) MCG/ACT inhaler, INHALE 2 PUFFS INTO THE LUNGS EVERY 6 HOURS AS NEEDED FOR WHEEZING OR SHORTNESS OF BREATH, Disp: 54 g, Rfl: 0   cetirizine (ZYRTEC) 10 MG tablet, Take 10 mg by mouth daily., Disp: , Rfl:    cholecalciferol (VITAMIN D) 25 MCG (1000 UNIT) tablet, Take 1,000 Units by mouth daily., Disp: , Rfl:    dexlansoprazole (DEXILANT) 60 MG capsule, TAKE 1 CAPSULE BY MOUTH EVERY MORNING, Disp: 90 capsule, Rfl: 4   diphenhydrAMINE (BENADRYL) 25 MG tablet, Take 25 mg by mouth every 6 (six) hours as needed., Disp: , Rfl:    fluticasone (FLONASE) 50 MCG/ACT nasal spray, Place 2 sprays into the nose in the morning., Disp: , Rfl:    guaifenesin (HUMIBID E) 400 MG TABS tablet, Take 400 mg by mouth daily., Disp: , Rfl:    LORazepam (ATIVAN) 0.5 MG tablet, Recommend1 tablet p.o. 30  minutes before the MRI.  May repeat once if needed, Disp: 2 tablet, Rfl: 0   Multiple Vitamin (MULTIVITAMIN WITH MINERALS) TABS tablet, Take 1 tablet by mouth daily., Disp: , Rfl:    phenylephrine (SUDAFED PE) 10 MG TABS tablet, Take 10 mg by mouth every 4 (four) hours as needed (sinus congestion.)., Disp: , Rfl:    Medications ordered in this encounter:  Meds ordered this encounter  Medications   amoxicillin-clavulanate (AUGMENTIN) 875-125 MG tablet    Sig: Take 1 tablet by mouth 2 (two) times daily for 7 days.    Dispense:  14 tablet    Refill:  0    Order Specific Question:   Supervising Provider    Answer:   Chase Picket [[id number]]   fluconazole (DIFLUCAN) 150 MG tablet    Sig: Take 1 tablet (150 mg total) by mouth every 3 (three) days for 9 days.    Dispense:  3 tablet    Refill:  0    Order Specific Question:   Supervising Provider    Answer:   Chase Picket [id number]     *If you need refills on other medications prior to your next appointment, please contact your pharmacy*  Follow-Up: Call back or seek an in-person evaluation if the symptoms worsen or if the condition fails to improve as anticipated.  [facility] [phone]  Other Instructions INSTRUCTIONS: use a humidifier for nasal congestion Drink  plenty of fluids, rest and wash hands frequently to avoid the spread of infection Alternate tylenol and Motrin for relief of fever    If you have been instructed to have an in-person evaluation today at a local Urgent Care facility, please use the link below. It will take you to a list of all of our available [facility], including address, phone number and hours of operation. Please do not delay care.  [facility]  If you or a family member do not have a primary care provider, use the link below to schedule a visit and establish care. When you choose a [facility] primary care physician or advanced practice provider, you gain  a long-term partner in health. Find a Primary Care Provider  Learn more about [facility]'s in-office and virtual care options: [facility]

## 2021-12-28 NOTE — Progress Notes (Signed)
Virtual Visit Consent   JAIDALYN SCHILLO, you are scheduled for a virtual visit with a [facility] provider today. Just as with appointments in the office, your consent must be obtained to participate. Your consent will be active for this visit and any virtual visit you may have with one of our providers in the next 365 days. If you have a MyChart account, a copy of this consent can be sent to you electronically.  As this is a virtual visit, video technology does not allow for your provider to perform a traditional examination. This may limit your provider's ability to fully assess your condition. If your provider identifies any concerns that need to be evaluated in person or the need to arrange testing (such as labs, EKG, etc.), we will make arrangements to do so. Although advances in technology are sophisticated, we cannot ensure that it will always work on either your end or our end. If the connection with a video visit is poor, the visit may have to be switched to a telephone visit. With either a video or telephone visit, we are not always able to ensure that we have a secure connection.  By engaging in this virtual visit, you consent to the provision of healthcare and authorize for your insurance to be billed (if applicable) for the services provided during this visit. Depending on your insurance coverage, you may receive a charge related to this service.  I need to obtain your verbal consent now. Are you willing to proceed with your visit today? RUDINE RIEGER has provided verbal consent on 12/28/2021 for a virtual visit (video or telephone). Gildardo Pounds, NP  Date: 12/28/2021 1:27 PM  Virtual Visit via Video Note   I, Gildardo Pounds, connected with  IRVING LUBBERS  ([id number], Sep 21, 1960) on 12/28/21 at  1:15 PM EST by a video-enabled telemedicine application and verified that I am speaking with the correct person using two identifiers.  Location: Patient: Virtual Visit Location Patient:  Home Provider: Virtual Visit Location Provider: Home Office   I discussed the limitations of evaluation and management by telemedicine and the availability of in person appointments. The patient expressed understanding and agreed to proceed.    History of Present Illness: Phyllis Morgan is a 61 y.o. who identifies as a female who was assigned female at birth, and is being seen today for bacterial sinusitis.  Patient complains of  frontal headache, sinus pressure, nasal congestion, low-grade fever 100.3, postnasal drip and throat irritation . Onset of symptoms was several days ago and feels similar to previous sinus infections, unchanged since onset.  Past history is significant for pneumonia and bronchitis . 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[place]) 12/12/2020   Pulmonary nodules/lesions, multiple 12/09/2020   Pneumonia due to COVID-19 virus 12/02/2020   Pleuritic pain 12/02/2020   Preop pulmonary/respiratory exam 11/14/2020   Sinusitis, bacterial 11/04/2020   Mixed hyperlipidemia 10/04/2020   Family history of MI (myocardial infarction) 10/04/2020   Solitary pulmonary nodule 06/04/2020   PVC (premature ventricular contraction) 01/12/2020   Family history of abdominal aortic aneurysm 01/12/2020   Chronic bronchitis, unspecified chronic bronchitis type ([place]) 01/03/2020   Esophageal reflux 07/03/2015   Celiac sprue 07/03/2015   Seasonal allergies    Celiac disease 02/03/2011    Allergies:  Allergies  Allergen Reactions   Avelox [Moxifloxacin Hcl  In Nacl] Nausea Only   Other     PT has Celiac disease    Sulfa Antibiotics Other (See Comments)    Blurred vision   Medications:  Current Outpatient Medications:    amoxicillin-clavulanate (AUGMENTIN) 875-125 MG tablet, Take 1 tablet by mouth 2 (two) times daily for 7 days., Disp: 14 tablet, Rfl:  0   fluconazole (DIFLUCAN) 150 MG tablet, Take 1 tablet (150 mg total) by mouth every 3 (three) days for 9 days., Disp: 3 tablet, Rfl: 0   acetaminophen (TYLENOL) 500 MG tablet, Take 1,000 mg by mouth every 6 (six) hours as needed (for pain/headaches.)., Disp: , Rfl:    albuterol (VENTOLIN HFA) 108 (90 Base) MCG/ACT inhaler, INHALE 2 PUFFS INTO THE LUNGS EVERY 6 HOURS AS NEEDED FOR WHEEZING OR SHORTNESS OF BREATH, Disp: 54 g, Rfl: 0   cetirizine (ZYRTEC) 10 MG tablet, Take 10 mg by mouth daily., Disp: , Rfl:    cholecalciferol (VITAMIN D) 25 MCG (1000 UNIT) tablet, Take 1,000 Units by mouth daily., Disp: , Rfl:    dexlansoprazole (DEXILANT) 60 MG capsule, TAKE 1 CAPSULE BY MOUTH EVERY MORNING, Disp: 90 capsule, Rfl: 4   diphenhydrAMINE (BENADRYL) 25 MG tablet, Take 25 mg by mouth every 6 (six) hours as needed., Disp: , Rfl:    fluticasone (FLONASE) 50 MCG/ACT nasal spray, Place 2 sprays into the nose in the morning., Disp: , Rfl:    guaifenesin (HUMIBID E) 400 MG TABS tablet, Take 400 mg by mouth daily., Disp: , Rfl:    LORazepam (ATIVAN) 0.5 MG tablet, Recommend1 tablet p.o. 30 minutes before the MRI.  May repeat once if needed, Disp: 2 tablet, Rfl: 0   Multiple Vitamin (MULTIVITAMIN WITH MINERALS) TABS tablet, Take 1 tablet by mouth daily., Disp: , Rfl:    phenylephrine (SUDAFED PE) 10 MG TABS tablet, Take 10 mg by mouth every 4 (four) hours as needed (sinus congestion.)., Disp: , Rfl:   Observations/Objective: Patient is well-developed, well-nourished in no acute distress.  Resting comfortably at home.  Head is normocephalic, atraumatic.  No labored breathing.  Speech is clear and coherent with logical content.  Patient is alert and oriented at baseline.    Assessment and Plan: 1. Bacterial sinusitis - amoxicillin-clavulanate (AUGMENTIN) 875-125 MG tablet; Take 1 tablet by mouth 2 (two) times daily for 7 days.  Dispense: 14 tablet; Refill: 0  2. Yeast vaginitis - fluconazole (DIFLUCAN)  150 MG tablet; Take 1 tablet (150 mg total) by mouth every 3 (three) days for 9 days.  Dispense: 3 tablet; Refill: 0   Follow Up Instructions: I discussed the assessment and treatment plan with the patient. The patient was provided an opportunity to ask questions and all were answered. The patient agreed with the plan and demonstrated an understanding of the instructions.  A copy of instructions were sent to the patient via MyChart unless otherwise noted below.   The patient was advised to call back or seek an in-person evaluation if the symptoms worsen or if the condition fails to improve as anticipated.  Time:  I spent 11 minutes with the patient via telehealth technology discussing the above problems/concerns.    Gildardo Pounds, NP

## 2022-01-02 ENCOUNTER — Ambulatory Visit (HOSPITAL_BASED_OUTPATIENT_CLINIC_OR_DEPARTMENT_OTHER)
Admission: RE | Admit: 2022-01-02 | Discharge: 2022-01-02 | Disposition: A | Discharge status: Home / Self Care | Payer: BC Managed Care – PPO | Source: Ambulatory Visit | Attending: Family Medicine | Admitting: Family Medicine

## 2022-01-02 ENCOUNTER — Ambulatory Visit (INDEPENDENT_AMBULATORY_CARE_PROVIDER_SITE_OTHER): Payer: BC Managed Care – PPO | Admitting: Family Medicine

## 2022-01-02 ENCOUNTER — Telehealth: Payer: Self-pay | Admitting: Family

## 2022-01-02 ENCOUNTER — Encounter: Payer: Self-pay | Admitting: Family Medicine

## 2022-01-02 VITALS — BP 97/55 | HR 106 | Temp 98.3°F | Resp 99 | Wt 153.1 lb

## 2022-01-02 DIAGNOSIS — R509 Fever, unspecified: Secondary | ICD-10-CM | POA: Insufficient documentation

## 2022-01-02 DIAGNOSIS — J189 Pneumonia, unspecified organism: Secondary | ICD-10-CM

## 2022-01-02 MED ORDER — AZITHROMYCIN 250 MG PO TABS: ORAL_TABLET | ORAL | 0 refills | Status: AC

## 2022-01-02 NOTE — Addendum Note (Signed)
Addended by: Caleen Jobs B on: 01/02/2022 01:44 PM   Modules accepted: Orders

## 2022-01-02 NOTE — Progress Notes (Signed)
Acute Office Visit  Subjective:     Patient ID: Phyllis Morgan, female    DOB: October 28, 1960, 61 y.o.   MRN: [id number]  Chief Complaint  Patient presents with   Cough   Fever     Patient is in today for possible pneumonia.  Patient reports she was having sinusitis symptoms and did a virtual visit on Sunday. She was given 7 days of Augmentin. Reports sinus symptoms are feeling better, but she has continued running fevers so she wants to be sure she does not have pneumonia. No significant cough or purulent sputum. States she has previously been in the hospital earlier this year with neutropenic fever and had some mild pneumonia that has not seemed to change on more recent imaging. 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 She had CBC drawn last week with WBC 2.2. She is scheduled to ofllow-up with them next week and repeat CT scan, but she is scared to go through the weekend with these ongoing fevers. She is anxious/tearful because her daughter is getting married in [place] in 2 weeks and she really wants to be healthy enough to make it to the wedding. Reports latest fever was 102F this morning - currently 98.33F after taking tylenol. She denies any new chest pain, dyspnea, sputum, hemoptysis, lethargy, GI/GU changes.   ROS: All review of systems negative except what is listed in the HPI      Objective:    BP (!) 97/55   Pulse (!) 106   Temp 98.3 F (36.8 C)   Resp (!) 99   Wt 153 lb 1.6 oz (69.4 kg)   LMP 10/10/2011   HC 16" (40.6 cm)   BMI 26.28 kg/m    Physical Exam Vitals reviewed.  Constitutional:      Appearance: Normal appearance.  HENT:     Head: Normocephalic and atraumatic.     Right Ear: Tympanic membrane normal.     Left Ear: Tympanic membrane normal.     Ears:     Comments: Small area of dry/flaked skin in left canal Cardiovascular:     Rate and Rhythm: Normal rate and regular rhythm.   Pulmonary:     Effort: Pulmonary effort is normal.     Breath sounds: Normal breath sounds. No wheezing, rhonchi or rales.  Chest:     Chest wall: No tenderness.  Musculoskeletal:     Cervical back: Normal range of motion and neck supple.  Skin:    General: Skin is warm and dry.  Neurological:     General: No focal deficit present.     Mental Status: She is alert and oriented to person, place, and time. Mental status is at baseline.  Psychiatric:        Mood and Affect: Mood normal.        Behavior: Behavior normal.        Thought Content: Thought content normal.        Judgment: Judgment normal.     No results found for any visits on 01/02/22.      Assessment & Plan:   Problem List Items Addressed This Visit   None Visit Diagnoses     Fever, unspecified fever cause    -  Primary No other symptoms, recovering from recent sinus infection CXR (stat) at patient request - will update with results Declined additional labs as they were just done 2 days ago Continue supportive measures including  rest, hydration, humidifier use, steam showers, warm compresses to sinuses, warm liquids with lemon and honey, and over-the-counter cough, cold, and analgesics as needed.  Patient aware of signs/symptoms requiring further/urgent evaluation.    Relevant Orders   DG Chest 2 View (Completed)       No orders of the defined types were placed in this encounter.   Return if symptoms worsen or fail to improve.  Terrilyn Saver, NP

## 2022-01-02 NOTE — Assessment & Plan Note (Signed)
She has seen [facility] and diagnosis has to changed lymphomatoid granulomatosis

## 2022-03-10 ENCOUNTER — Encounter: Payer: Self-pay | Admitting: Internal Medicine

## 2022-03-10 ENCOUNTER — Ambulatory Visit: Payer: BC Managed Care – PPO | Attending: Internal Medicine | Admitting: Internal Medicine

## 2022-03-10 VITALS — BP 102/68 | HR 85 | Ht 64.0 in | Wt 140.0 lb

## 2022-03-10 DIAGNOSIS — I959 Hypotension, unspecified: Secondary | ICD-10-CM | POA: Insufficient documentation

## 2022-03-10 DIAGNOSIS — C8380 Other non-follicular lymphoma, unspecified site: Secondary | ICD-10-CM

## 2022-03-10 DIAGNOSIS — Z8249 Family history of ischemic heart disease and other diseases of the circulatory system: Secondary | ICD-10-CM | POA: Diagnosis not present

## 2022-03-10 DIAGNOSIS — I9589 Other hypotension: Secondary | ICD-10-CM

## 2022-03-10 LAB — LIPID PANEL
Chol/HDL Ratio: 5 ratio — ABNORMAL HIGH (ref 0.0–4.4)
Cholesterol, Total: 161 mg/dL (ref 100–199)
HDL: 32 mg/dL — ABNORMAL LOW (ref >39)
LDL Chol Calc (NIH): 98 mg/dL (ref 0–99)
Triglycerides: 179 mg/dL — ABNORMAL HIGH (ref 0–149)
VLDL Cholesterol Cal: 31 mg/dL (ref 5–40)

## 2022-03-10 NOTE — Progress Notes (Signed)
Cardiology Office Note:    Date:  03/10/2022   ID:  Phyllis Morgan, DOB [birth year], MRN [id number]  PCP:  Phyllis Morgan, [facility] Cardiologist:  Phyllis Lean, MD  [facility] Electrophysiologist:  None   CC: HLD f/u  History of Present Illness:    Phyllis Morgan is a 62 y.o. female with a hx of Celiac Disease, HLD, GERD, and IDA who presents for evaluation 01/12/2020.  2022: In interim of this visit, patient had CT with normal arteries but with pulmonary nodule:  Referred to [facility] clinic.  Has had recent endometrial biopsy.  Has had recent testing for Lyme disease. Stared HRT in 2022. 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  She is in a study at the [facility].  Patient notes that she is doing ok.   Since last visit notes that she struggled a lot with her rare disease but hs feeling better; she lost 15 + lbs. She received cardiotoxic chemotherapy.  No radiation.  She did not have lung cancer.  She had an echo pre-echo and the [facility] that was normal. There are no interval hospital/ED visit.    No chest pain or pressure .  No SOB/DOE and no PND/Orthopnea.  No weight gain or leg swelling.  No palpitations or syncope.   Past Medical History:  Diagnosis Date   Asthma    exercise induced   Biliary dyskinesia    Celiac disease    Celiac disease 2013   Colon polyps    Dyspnea    Dysrhythmia    PVCs   Gastric polyps    GERD (gastroesophageal reflux disease)    Headache    History of kidney stones    IDA (iron deficiency anemia)    Osteopenia 11/2018   T score -1.3 FRAX 6.5% / 0.4% stable from prior DEXA   Pneumonia    as a child   PONV (postoperative nausea and vomiting)    Seasonal allergies     Past Surgical History:  Procedure Laterality Date   BRONCHIAL BIOPSY  12/09/2020   Procedure: BRONCHIAL BIOPSIES;  Surgeon: Collene Gobble, MD;   Location[facility] END[facility]COPY;  Service: Pulmonary;;   BRONCHIAL BRUSHINGS  12/09/2020   Procedure: BRONCHIAL BRUSHINGS;  Surgeon: Collene Gobble, MD;  Location[facility] ENDOSCOPY;  Service: Pulmonary;;   BRONCHIAL NEEDLE ASPIRATION BIOPSY  12/09/2020   Procedure: BRONCHIAL NEEDLE ASPIRATION BIOPSIES;  Surgeon: Collene Gobble, MD;  Location: [facility] ENDOSCOPY;  Service: Pulmonary;;   BRONCHIAL WASHINGS  12/09/2020   Procedure: BRONCHIAL WASHINGS;  Surgeon: Collene Gobble, MD;  Location: [facility] ENDOSCOPY;  Service: Pulmonary;;   CESAREAN SECTION     CHOLECYSTECTOMY  2007   ENDOMETRIAL ABLATION  12/21/2006   HER OPTION CRYOABLATION    HAND SURGERY     THUMB/RIGHT HAND   KNEE ARTHROSCOPY Left 11/26/2020   Procedure: LEFT KNEE ARTHROSCOPY, PARTIAL MEDIAL MENISECTOMY;  Surgeon: Melrose Nakayama, MD;  Location: [facility] ORS;  Service: Orthopedics;  Laterality: Left;   KNEE SURGERY Right 2019   Left eye retinal tear  12/18/2018   [facility]   OVARIAN CYST REMOVAL Right 1993   Rt Ovarian Cystectomy   PELVIC LAPAROSCOPY     OVARIAN CYSTECTOMY   TUBAL LIGATION  12/2002   FALLOPE RINGS   UMBILICAL HERNIA REPAIR     VIDEO BRONCHOSCOPY WITH ENDOBRONCHIAL NAVIGATION N/A 12/09/2020   Procedure: VIDEO BRONCHOSCOPY WITH ENDOBRONCHIAL NAVIGATION;  Surgeon: Collene Gobble, MD;  Location: [facility] ENDOSCOPY;  Service: Pulmonary;  Laterality: N/A;    Current Medications: Current Meds  Medication Sig   acetaminophen (TYLENOL) 500 MG tablet Take 1,000 mg by mouth every 6 (six) hours as needed (for pain/headaches.).   albuterol (VENTOLIN HFA) 108 (90 Base) MCG/ACT inhaler INHALE 2 PUFFS INTO THE LUNGS EVERY 6 HOURS AS NEEDED FOR WHEEZING OR SHORTNESS OF BREATH   cetirizine (ZYRTEC) 10 MG tablet Take 10 mg by mouth daily.   cholecalciferol (VITAMIN D) 25 MCG (1000 UNIT) tablet Take 1,000 Units by mouth daily.   dexlansoprazole (DEXILANT) 60 MG capsule TAKE 1 CAPSULE BY MOUTH EVERY MORNING   diphenhydrAMINE (BENADRYL)  25 MG tablet Take 25 mg by mouth every 6 (six) hours as needed.   fluticasone (FLONASE) 50 MCG/ACT nasal spray Place 2 sprays into the nose in the morning.   guaifenesin (HUMIBID E) 400 MG TABS tablet Take 400 mg by mouth as needed (congestion).   Multiple Vitamin (MULTIVITAMIN WITH MINERALS) TABS tablet Take 1 tablet by mouth daily.   phenylephrine (SUDAFED PE) 10 MG TABS tablet Take 10 mg by mouth every 4 (four) hours as needed (sinus congestion.).   polyethylene glycol (MIRALAX / GLYCOLAX) 17 g packet Take by mouth as needed for moderate constipation.    Allergies:   Avelox [moxifloxacin hcl in nacl], Other, and Sulfa antibiotics   Social History   Socioeconomic History   Marital status: Married    Spouse name: Not on file   Number of children: 1   Years of education: Not on file   Highest education level: Not on file  Occupational History   Occupation: Freight forwarder  Tobacco Use   Smoking status: Never   Smokeless tobacco: Never  Vaping Use   Vaping Use: Never used  Substance and Sexual Activity   Alcohol use: Yes    Alcohol/week: 0.0 standard drinks of alcohol    Comment: Rare   Drug use: No   Sexual activity: Yes    Birth control/protection: Surgical    Comment: Tubal lig-1st intercourse 62 yo-Fewer than 5 partners  Other Topics Concern   Not on file  Social History Narrative   Not on file   Social Determinants of Health   Financial Resource Strain: Not on file  Food Insecurity: Not on file  Transportation Needs: Not on file  Physical Activity: Not on file  Stress: Not on file  Social Connections: Not on file     Family History: The patient's family history includes Cancer in her father, mother, and paternal grandmother; Celiac disease in her mother; Healthy in her daughter; Heart disease in her maternal grandmother and mother; Hypertension in her father and mother. There is no history of Colon cancer, Esophageal cancer, Rectal cancer, or Stomach cancer.  Denies  family history of sudden cardiac death including drowning, car accidents, or unexplained deaths in the family. No history hypertrophic cardiomyopathy, left ventricular non-compaction, or arrhythmogenic right ventricular cardiomyopathy; but history of CHF NOS in father No history of bicuspid aortic valve or thoracic aortic aneurysm or dissection; mother died of an abdominal aortic aneurysm. History of coronary artery disease notable for mother (age 38) and grandmother. History of arrhythmia notable for atrial fibrillation in father.  ROS:   Please see the history of present illness.    All other systems reviewed and are negative.  EKGs/Labs/Other Studies Reviewed:    The following studies were reviewed today:  EKG:  EKG is  ordered today.  The ekg ordered today demonstrates  03/10/2022: SR 01/14/2020 SR rate 67 WNL  Cardiac CT: Date:04/02/20 Results: IMPRESSION: 1. Coronary artery calcium score 0 Agatston units, suggesting low risk for future cardiac events.   2.  No significant coronary disease noted.  3. 4 mm subpleural nodule in the medial aspect of the right upper lobe, nonspecific, but statistically likely a benign subpleural lymph node. No follow-up needed if patient is low-risk. Non-contrast chest CT can be considered in 12 months if patient is high-risk. This recommendation follows the consensus statement: Guidelines for Management of Incidental Pulmonary Nodules Detected on CT Images: From the Fleischner Society 2017; Radiology 2017; 284:228-243.   Recent Labs: No results found for requested labs within last 365 days.  Recent Lipid Panel    Component Value Date/Time   CHOL 182 10/08/2020 0920   TRIG 244 (H) 10/08/2020 0920   HDL 26 (L) 10/08/2020 0920   CHOLHDL 7.0 (H) 10/08/2020 0920   CHOLHDL 5.1 (H) 08/15/2019 0940   VLDL 21 09/05/2015 0907   LDLCALC 113 (H) 10/08/2020 0920   LDLCALC 115 (H) 08/15/2019 0940     Physical Exam:    VS:  BP 102/68   Pulse 85    Ht 5\' 4"  (1.626 m)   Wt 140 lb (63.5 kg)   LMP 10/10/2011   SpO2 98%   BMI 24.03 kg/m     Wt Readings from Last 3 Encounters:  03/10/22 140 lb (63.5 kg)  01/02/22 153 lb 1.6 oz (69.4 kg)  12/23/20 161 lb (73 kg)    GEN:  Well nourished, well developed in no acute distress HEENT: Normal NECK: No JVD CARDIAC: RRR, no murmurs, rubs, gallops RESPIRATORY:  Clear to auscultation without rales, wheezing or rhonchi  ABDOMEN: Soft, non-tender, non-distended MUSCULOSKELETAL:  No edema; No deformity  SKIN: Warm and dry NEUROLOGIC:  Alert and oriented x 3 PSYCHIATRIC:  Normal affect   ASSESSMENT:    1. 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 Other specified hypotension   3. Family history of MI (myocardial infarction)     PLAN:    Lymphatoid graulomatosis s/p R-EPOCH Hypotension - will get echo with strain in follow up - discussed nutrition as hypotension is likely related to her sequale from chemo  HLD Family history of MI - LDL goal set at < 100 in shared decision making - Fasting lipids today  Family history of abdominal aortic aneurysm - no evidence on 01/17/2018  -may discuss post age 5 screening  PVC - monitor presently, no issues; they have resolved  PRN unless LV dysfunction    Medication Adjustments/Labs and Tests Ordered: Current medicines are reviewed at length with the patient today.  Concerns regarding medicines are outlined above.  Orders Placed This Encounter  Procedures   Lipid panel   EKG 12-Lead   ECHOCARDIOGRAM COMPLETE    No orders of the defined types were placed in this encounter.    Patient Instructions  Medication Instructions:  Your physician recommends that you continue on your current medications as directed. Please refer to the Current Medication list given to you today.  *If you need a refill on your cardiac medications before your next appointment, please call your pharmacy*   Lab Work: TODAY: FLP  If you have labs (blood  work) drawn today and your tests are completely normal, you will receive your results only by: [place] (if you have MyChart) OR A paper copy in the mail If you have any lab test that is abnormal  or we need to change your treatment, we will call you to review the results.   Testing/Procedures: Your physician has requested that you have an echocardiogram. Echocardiography is a painless test that uses sound waves to create images of your heart. It provides your doctor with information about the size and shape of your heart and how well your heart's chambers and valves are working. This procedure takes approximately one hour. There are no restrictions for this procedure. Please do NOT wear cologne, perfume, aftershave, or lotions (deodorant is allowed). Please arrive 15 minutes prior to your appointment time.    Follow-Up:As needed At [facility], you and your health needs are our priority.  As part of our continuing mission to provide you with exceptional heart care, we have created designated Provider Care Teams.  These Care Teams include your primary Cardiologist (physician) and Advanced Practice Providers (APPs -  Physician Assistants and Nurse Practitioners) who all work together to provide you with the care you need, when you need it.   Provider:   Werner Lean, MD        Signed, Phyllis Lean, MD  03/10/2022 10:14 AM    [place]

## 2022-03-10 NOTE — Patient Instructions (Signed)
Medication Instructions:  Your physician recommends that you continue on your current medications as directed. Please refer to the Current Medication list given to you today.  *If you need a refill on your cardiac medications before your next appointment, please call your pharmacy*   Lab Work: TODAY: FLP  If you have labs (blood work) drawn today and your tests are completely normal, you will receive your results only by: [place] (if you have MyChart) OR A paper copy in the mail If you have any lab test that is abnormal or we need to change your treatment, we will call you to review the results.   Testing/Procedures: Your physician has requested that you have an echocardiogram. Echocardiography is a painless test that uses sound waves to create images of your heart. It provides your doctor with information about the size and shape of your heart and how well your heart's chambers and valves are working. This procedure takes approximately one hour. There are no restrictions for this procedure. Please do NOT wear cologne, perfume, aftershave, or lotions (deodorant is allowed). Please arrive 15 minutes prior to your appointment time.    Follow-Up:As needed At [facility], you and your health needs are our priority.  As part of our continuing mission to provide you with exceptional heart care, we have created designated Provider Care Teams.  These Care Teams include your primary Cardiologist (physician) and Advanced Practice Providers (APPs -  Physician Assistants and Nurse Practitioners) who all work together to provide you with the care you need, when you need it.   Provider:   Werner Lean, MD

## 2022-03-10 NOTE — Telephone Encounter (Signed)
error 

## 2022-03-25 ENCOUNTER — Encounter: Payer: Self-pay | Admitting: Obstetrics and Gynecology

## 2022-03-30 ENCOUNTER — Ambulatory Visit (HOSPITAL_COMMUNITY): Payer: BC Managed Care – PPO | Attending: Internal Medicine

## 2022-03-30 DIAGNOSIS — C8380 Other non-follicular lymphoma, unspecified site: Secondary | ICD-10-CM

## 2022-03-30 DIAGNOSIS — I9589 Other hypotension: Secondary | ICD-10-CM | POA: Diagnosis not present

## 2022-03-30 DIAGNOSIS — Z8249 Family history of ischemic heart disease and other diseases of the circulatory system: Secondary | ICD-10-CM | POA: Diagnosis not present

## 2022-03-30 LAB — ECHOCARDIOGRAM COMPLETE
Area-P 1/2: 3.74 cm2
S' Lateral: 2.3 cm

## 2022-04-27 ENCOUNTER — Encounter: Payer: Self-pay | Admitting: Obstetrics and Gynecology

## 2022-04-27 ENCOUNTER — Other Ambulatory Visit (HOSPITAL_COMMUNITY)
Admission: RE | Admit: 2022-04-27 | Discharge: 2022-04-27 | Disposition: A | Discharge status: Home / Self Care | Payer: BC Managed Care – PPO | Source: Ambulatory Visit | Attending: Obstetrics and Gynecology | Admitting: Obstetrics and Gynecology

## 2022-04-27 ENCOUNTER — Ambulatory Visit: Payer: BC Managed Care – PPO | Admitting: Obstetrics and Gynecology

## 2022-04-27 VITALS — BP 112/74 | HR 76 | Ht 64.0 in | Wt 142.0 lb

## 2022-04-27 DIAGNOSIS — N951 Menopausal and female climacteric states: Secondary | ICD-10-CM | POA: Diagnosis not present

## 2022-04-27 DIAGNOSIS — N898 Other specified noninflammatory disorders of vagina: Secondary | ICD-10-CM | POA: Diagnosis not present

## 2022-04-27 DIAGNOSIS — N949 Unspecified condition associated with female genital organs and menstrual cycle: Secondary | ICD-10-CM | POA: Diagnosis not present

## 2022-04-27 DIAGNOSIS — Z87440 Personal history of urinary (tract) infections: Secondary | ICD-10-CM

## 2022-04-27 LAB — WET PREP FOR TRICH, YEAST, CLUE

## 2022-04-27 MED ORDER — ESTRADIOL 0.1 MG/GM VA CREA: TOPICAL_CREAM | VAGINAL | 0 refills | Status: DC

## 2022-04-27 NOTE — Patient Instructions (Signed)
Atrophic Vaginitis  Atrophic vaginitis is a condition in which the tissues that line the vagina become dry and thin. This condition is most common in women who have stopped having regular menstrual periods (are in menopause). This usually starts when a woman is 45 to 60 years old. That is the time when a woman's estrogen levels begin to decrease. Estrogen is a female hormone. It helps to keep the tissues of the vagina moist. It stimulates the vagina to produce a clear fluid that lubricates the vagina for sex. This fluid also protects the vagina from infection. Lack of estrogen can cause the lining of the vagina to get thinner and dryer. The vagina may also shrink in size. It may become less elastic. Atrophic vaginitis tends to get worse over time as a woman's estrogen level drops. What are the causes? This condition is caused by the normal drop in estrogen that happens around the time of menopause. What increases the risk? Certain conditions or situations may lower a woman's estrogen level, leading to a higher risk for atrophic vaginitis. You are more likely to develop this condition if: You are taking medicines that block estrogen. You have had your ovaries removed. You are being treated for cancer with radiation or medicines (chemotherapy). You have given birth or are breastfeeding. You are older than age 50. You smoke. What are the signs or symptoms? Symptoms of this condition include: Pain, soreness, a feeling of pressure, or bleeding during sex (dyspareunia). Vaginal burning, irritation, or itching. Pain or bleeding when a speculum is used in a vaginal exam. Having burning pain while urinating. Vaginal discharge. In some cases, there are no symptoms. How is this diagnosed? This condition is diagnosed based on your medical history and a physical exam. This will include a pelvic exam that checks the vaginal tissues. Though rare, you may also have other tests, including: A urine test. A  test that checks the acid balance in your vagina (acid balance test). How is this treated? Treatment for this condition depends on how severe your symptoms are. Treatment may include: Using an over-the-counter vaginal lubricant before sex. Using a long-acting vaginal moisturizer. Using low-dose estrogen for moderate to severe symptoms that do not respond to other treatments. Options include creams, tablets, and inserts (vaginal rings). Before you use a vaginal estrogen, tell your health care provider if you have a history of: Breast cancer. Endometrial cancer. Blood clots. If you are not sexually active and your symptoms are very mild, you may not need treatment. Follow these instructions at home: Medicines Take over-the-counter and prescription medicines only as told by your health care provider. Do not use herbal or alternative medicines unless your health care provider says that you can. Use over-the-counter creams, lubricants, or moisturizers for dryness only as told by your health care provider. General instructions If your atrophic vaginitis is caused by menopause, discuss all of your menopause symptoms and treatment options with your health care provider. Do not douche. Do not use products that can make your vagina dry. These include: Scented feminine sprays. Scented tampons. Scented soaps. Vaginal sex can help to improve blood flow and elasticity of vaginal tissue. If you choose to have sex and it hurts, try using a water-soluble lubricant or moisturizer right before having sex. Contact a health care provider if: Your discharge looks different than normal. Your vagina has an unusual smell. You have new symptoms. Your symptoms do not improve with treatment. Your symptoms get worse. Summary Atrophic vaginitis is a condition   in which the tissues that line the vagina become dry and thin. It is most common in women who have stopped having regular menstrual periods (are in  menopause). Treatment options include using vaginal lubricants and low-dose vaginal estrogen. Contact a health care provider if your vagina has an unusual smell, or if your symptoms get worse or do not improve after treatment. This information is not intended to replace advice given to you by your health care provider. Make sure you discuss any questions you have with your health care provider. Document Revised: 07/20/2019 Document Reviewed: 07/20/2019 Elsevier Patient Education  2023 Elsevier Inc.  

## 2022-04-27 NOTE — Progress Notes (Unsigned)
GYNECOLOGY  VISIT   HPI: 62 y.o.   Married  Caucasian  female   [phone] with Patient's last menstrual period was 10/10/2011.   here for   vaginal infection. Pt has vaginal burning outside of urination, and odor.  Treating for lymphomatoid granulomatosis. Sees a hematologist/oncologist at [facility] and a neuro-oncologist at [facility].   Had a UTI after she had chemotherapy. She has been on Cipro as prophylaxis following each cycle of chemotherapy.   Dealing with urinary urgency.   Saw Dr. Erskine Speed from [facility] 4 weeks ago, and she was dx with UTI, and took [place].  There was mention of potential vaginal estrogen cream, but patient did not receive a prescription.   Has spontaneous burning of her urethra/vagina.   Off of her HRT.  Dealing with hot flashes.  Wakes her up at night.  Asking about Paxil.   GYNECOLOGIC HISTORY: Patient's last menstrual period was 10/10/2011. 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/2022   Arterial hypotension 03/10/2022   Generalized anxiety disorder 02/19/2021   Pulmonary nodules/lesions, multiple 12/09/2020   Pneumonia due to COVID-19 virus 12/02/2020   Pleuritic pain 12/02/2020   Sinusitis, bacterial 11/04/2020   Mixed hyperlipidemia 10/04/2020   Family history of MI (myocardial infarction) 10/04/2020   Solitary pulmonary nodule 06/04/2020   Family history of abdominal aortic aneurysm 01/12/2020   Chronic bronchitis, unspecified chronic bronchitis type ([place]) 01/03/2020   Esophageal reflux 07/03/2015    Celiac sprue 07/03/2015   Seasonal allergies    Celiac disease 02/03/2011    Past Medical History:  Diagnosis Date   Asthma    exercise induced   Biliary dyskinesia    Celiac disease    Celiac disease 2013   Colon polyps    Dyspnea    Dysrhythmia    PVCs   Gastric polyps    GERD (gastroesophageal reflux disease)    Headache    History of kidney stones    IDA (iron deficiency anemia)    Osteopenia 11/2018   T score -1.3 FRAX 6.5% / 0.4% stable from prior DEXA   Pneumonia    as a child   PONV (postoperative nausea and vomiting)    Seasonal allergies     Past Surgical History:  Procedure Laterality Date   BRONCHIAL BIOPSY  12/09/2020   Procedure: BRONCHIAL BIOPSIES;  Surgeon: Collene Gobble, MD;  Location: [facility] ENDOSCOPY;  Service: Pulmonary;;   BRONCHIAL BRUSHINGS  12/09/2020   Procedure: BRONCHIAL BRUSHINGS;  Surgeon: Collene Gobble, MD;  Location: [facility] ENDOSCOPY;  Service: Pulmonary;;   BRONCHIAL NEEDLE ASPIRATION BIOPSY  12/09/2020   Procedure: BRONCHIAL NEEDLE ASPIRATION BIOPSIES;  Surgeon: Collene Gobble, MD;  Location: [facility] ENDOSCOPY;  Service: Pulmonary;;   BRONCHIAL  WASHINGS  12/09/2020   Procedure: BRONCHIAL WASHINGS;  Surgeon: Collene Gobble, MD;  Location: [facility] ENDOSCOPY;  Service: Pulmonary;;   cataract surgery Right    CESAREAN SECTION     CHOLECYSTECTOMY  2007   ENDOMETRIAL ABLATION  12/21/2006   HER OPTION CRYOABLATION    HAND SURGERY     THUMB/RIGHT HAND   KNEE ARTHROSCOPY Left 11/26/2020   Procedure: LEFT KNEE ARTHROSCOPY, PARTIAL MEDIAL MENISECTOMY;  Surgeon: Melrose Nakayama, MD;  Location: [facility] ORS;  Service: Orthopedics;  Laterality: Left;   KNEE SURGERY Right 2019   Left eye retinal tear  12/18/2018   [facility]   OVARIAN CYST REMOVAL Right 1993   Rt Ovarian Cystectomy   PELVIC LAPAROSCOPY     OVARIAN CYSTECTOMY   TUBAL LIGATION  12/2002   FALLOPE RINGS   UMBILICAL HERNIA REPAIR     VIDEO BRONCHOSCOPY WITH ENDOBRONCHIAL  NAVIGATION N/A 12/09/2020   Procedure: VIDEO BRONCHOSCOPY WITH ENDOBRONCHIAL NAVIGATION;  Surgeon: Collene Gobble, MD;  Location: [facility] ENDOSCOPY;  Service: Pulmonary;  Laterality: N/A;    Current Outpatient Medications  Medication Sig Dispense Refill   acetaminophen (TYLENOL) 500 MG tablet Take 1,000 mg by mouth every 6 (six) hours as needed (for pain/headaches.).     albuterol (VENTOLIN HFA) 108 (90 Base) MCG/ACT inhaler INHALE 2 PUFFS INTO THE LUNGS EVERY 6 HOURS AS NEEDED FOR WHEEZING OR SHORTNESS OF BREATH 54 g 0   Baclofen 5 MG TABS Take 1 tablet by mouth 2 (two) times daily.     cetirizine (ZYRTEC) 10 MG tablet Take 10 mg by mouth daily.     cholecalciferol (VITAMIN D) 25 MCG (1000 UNIT) tablet Take 1,000 Units by mouth daily.     dexlansoprazole (DEXILANT) 60 MG capsule TAKE 1 CAPSULE BY MOUTH EVERY MORNING 90 capsule 4   diphenhydrAMINE (BENADRYL) 25 MG tablet Take 25 mg by mouth every 6 (six) hours as needed.     fluticasone (FLONASE) 50 MCG/ACT nasal spray Place 2 sprays into the nose in the morning.     guaifenesin (HUMIBID E) 400 MG TABS tablet Take 400 mg by mouth as needed (congestion).     LORazepam (ATIVAN) 1 MG tablet Take by mouth.     Multiple Vitamin (MULTIVITAMIN WITH MINERALS) TABS tablet Take 1 tablet by mouth daily.     phenylephrine (SUDAFED PE) 10 MG TABS tablet Take 10 mg by mouth every 4 (four) hours as needed (sinus congestion.).     polyethylene glycol (MIRALAX / GLYCOLAX) 17 g packet Take by mouth as needed for moderate constipation.     No current facility-administered medications for this visit.     ALLERGIES: Avelox [moxifloxacin hcl in nacl], Other, and Sulfa antibiotics  Family History  Problem Relation Age of Onset   Hypertension Mother    Heart disease Mother    Cancer Mother        SKIN CANCER   Celiac disease Mother    Hypertension Father    Cancer Father        COLON   Heart disease Maternal Grandmother    Cancer Paternal Grandmother         Female cancer  ?type   Healthy Daughter    Colon cancer Neg Hx    Esophageal cancer Neg Hx    Rectal cancer Neg Hx    Stomach cancer Neg Hx     Social History   Socioeconomic History   Marital status: Married    Spouse name: Not on  file   Number of children: 1   Years of education: Not on file   Highest education level: Not on file  Occupational History   Occupation: Freight forwarder  Tobacco Use   Smoking status: Never   Smokeless tobacco: Never  Vaping Use   Vaping Use: Never used  Substance and Sexual Activity   Alcohol use: Yes    Alcohol/week: 0.0 standard drinks of alcohol    Comment: Rare   Drug use: No   Sexual activity: Yes    Birth control/protection: Surgical    Comment: Tubal lig-1st intercourse 62 yo-Fewer than 5 partners  Other Topics Concern   Not on file  Social History Narrative   Not on file   Social Determinants of Health   Financial Resource Strain: Not on file  Food Insecurity: Not on file  Transportation Needs: Not on file  Physical Activity: Not on file  Stress: Not on file  Social Connections: Not on file  Intimate Partner Violence: Not on file    Review of Systems  Genitourinary:  Positive for vaginal discharge.       Vagina odor and burning    PHYSICAL EXAMINATION:    BP 112/74 (BP Location: Right Arm, Patient Position: Sitting, Cuff Size: Normal)   Pulse 76   Ht 5\' 4"  (1.626 m)   Wt 142 lb (64.4 kg)   LMP 10/10/2011   SpO2 98%   BMI 24.37 kg/m     General appearance: alert, cooperative and appears stated age   Pelvic: External genitalia:  no lesions              Urethra:  normal appearing urethra with no masses, tenderness or lesions              Bartholins and Skenes: normal                 Vagina: normal appearing vagina with normal color and discharge, no lesions              Cervix: no lesions                Bimanual Exam:  Uterus:  normal size, contour, position, consistency, mobility, non-tender              Adnexa: no  mass, fullness, tenderness             Chaperone was present for exam:  Raquel Sarna  ASSESSMENT  Vaginal odor and burning.  I suspect some of the symptoms are from menopausal atrophy. Hx UTI.  Menopausal symptoms.    PLAN  Wet prep:  negative for yeast, clue cells, and trichomonas. Negative whiff test.  Nuswab sent.  Rx for vaginal estrogen cream.  Instructed in use.  I discussed potential effect on breast cancer.  We reviewed Paxil, Effexor, and gabapentin for treating vasomotor symptoms of menopause.  Patient will consider.  No Rx given.   Follow up for annual exam and prn.   Will need annual exam for further refills of vaginal estrogen.    An After Visit Summary was printed and given to the patient.  22 min  total time was spent for this patient encounter, including preparation, face-to-face counseling with the patient, coordination of care, and documentation of the encounter.

## 2022-04-28 ENCOUNTER — Encounter: Payer: Self-pay | Admitting: Obstetrics and Gynecology

## 2022-04-28 ENCOUNTER — Other Ambulatory Visit: Payer: Self-pay | Admitting: Obstetrics and Gynecology

## 2022-04-28 NOTE — Progress Notes (Signed)
Rx for vaginal estradiol cream discontinued by provider.

## 2022-04-28 NOTE — Telephone Encounter (Signed)
Please call the patient to be sure that she has received my recommendation through My chart to not use vaginal estrogen.   She sent a My Chart message reporting a history of pulmonary embolism today, and I responded back to her.    I have now added the history of pulmonary embolism to her medical record in Epic.

## 2022-04-28 NOTE — Telephone Encounter (Signed)
I called and spoke with patient and read her Dr. Elza Rafter reply:  "Hi Phyllis Morgan,    Thank you for letting me know about your history of a pulmonary embolism.  I have added this to your medical record.    I recommend you do not use the vaginal estrogen or other hormone therapy, as this can increase the risk of pulmonary embolism.    I will remove this from your medication list.    I would suggest trying vaginal vitamin E suppositories or vaginal liquid vitamin E.  Both products can be purchased on [employer].     I will ask my office to reach out to you to be sure you have received this message.    Josefa Half, MD"   Patient voiced understanding. She has not yet picked up the vaginal estrogen and I will call the pharmacy now and cancel it.   She knows this message is in My Chart if she would like to view it there.

## 2022-04-29 LAB — CERVICOVAGINAL ANCILLARY ONLY
Bacterial Vaginitis (gardnerella): NEGATIVE
Candida Glabrata: NEGATIVE
Candida Vaginitis: NEGATIVE
Comment: NEGATIVE
Comment: NEGATIVE
Comment: NEGATIVE
Comment: NEGATIVE
Trichomonas: NEGATIVE

## 2022-05-04 HISTORY — PX: CATARACT EXTRACTION: SUR2

## 2022-05-04 HISTORY — PX: MOHS SURGERY: SUR867

## 2022-06-08 ENCOUNTER — Other Ambulatory Visit: Payer: BC Managed Care – PPO

## 2022-06-08 ENCOUNTER — Other Ambulatory Visit: Payer: Self-pay | Admitting: *Deleted

## 2022-06-08 ENCOUNTER — Telehealth: Payer: Self-pay | Admitting: *Deleted

## 2022-06-08 DIAGNOSIS — N949 Unspecified condition associated with female genital organs and menstrual cycle: Secondary | ICD-10-CM

## 2022-06-08 DIAGNOSIS — R8290 Unspecified abnormal findings in urine: Secondary | ICD-10-CM

## 2022-06-08 MED ORDER — NITROFURANTOIN MONOHYD MACRO 100 MG PO CAPS: 100.0000 mg | ORAL_CAPSULE | Freq: Two times a day (BID) | ORAL | 0 refills | Status: DC

## 2022-06-08 NOTE — Telephone Encounter (Signed)
May come in to leave urine sample today or follow up with urology.

## 2022-06-08 NOTE — Telephone Encounter (Signed)
Call transferred from front office.  Spoke with patients spouse, Dorene Sorrow, ok per dpr. States his wife is not available, currently on a call that she can no miss, requesting OV today for UTI symptoms.   Spouse reports symptoms of odor and vaginal burning for 3-4 days. Denies fever/chills, flank pain, hematuria, vag d/c. Reports some "stomach issues", unsure of any further details. Has not taken anything OTC. Hx of UTI. Advised Dr. Edward Jolly is out of the office, no scheduled OV available, will have to review with covering provider and return call. States his wife will be available in 15 min. He is unsure if she has a PCP or if PCP has been called.   Per review of EPIC, last OV 04/27/22 -BS Seen by Urology 03/31/22  Routing to Elmarie Shiley, NP to review and advise.   Cc: Dr. Edward Jolly

## 2022-06-08 NOTE — Telephone Encounter (Signed)
Patient called back to let Jill,RN Know that she received her message about generic Macrobid having been sent to [facility]. She will pick up Rx.

## 2022-06-08 NOTE — Telephone Encounter (Signed)
Spoke with patient.  Patient states she is considering another urologist at this time. Request to come in to office to leave urine sample. Reviewed symptoms reported by spouse, reports odor is in urine. Lab appt scheduled for today at 1:50PM. Patient aware she will be called with results. Patient verbalizes understanding and is agreeable.   Future lab order placed.   Routing to provider for final review. Patient is agreeable to disposition. Will close encounter.  Cc: Dr. Edward Jolly

## 2022-06-09 ENCOUNTER — Telehealth: Payer: Self-pay | Admitting: Obstetrics and Gynecology

## 2022-06-11 LAB — URINE CULTURE
MICRO NUMBER:: 14917164
SPECIMEN QUALITY:: ADEQUATE

## 2022-06-11 LAB — URINALYSIS, COMPLETE W/RFL CULTURE
Bilirubin Urine: NEGATIVE
Glucose, UA: NEGATIVE
Hyaline Cast: NONE SEEN /LPF
Ketones, ur: NEGATIVE
Nitrites, Initial: POSITIVE — AB
Protein, ur: NEGATIVE
Specific Gravity, Urine: 1.005 (ref 1.001–1.035)
pH: 6 (ref 5.0–8.0)

## 2022-06-11 LAB — CULTURE INDICATED

## 2022-06-18 ENCOUNTER — Ambulatory Visit: Payer: BC Managed Care – PPO | Admitting: Obstetrics & Gynecology

## 2022-06-18 ENCOUNTER — Encounter: Payer: Self-pay | Admitting: Obstetrics & Gynecology

## 2022-06-18 ENCOUNTER — Telehealth: Payer: Self-pay

## 2022-06-18 VITALS — BP 110/68 | HR 102 | Temp 98.9°F

## 2022-06-18 DIAGNOSIS — R300 Dysuria: Secondary | ICD-10-CM

## 2022-06-18 DIAGNOSIS — R8290 Unspecified abnormal findings in urine: Secondary | ICD-10-CM

## 2022-06-18 LAB — WET PREP FOR TRICH, YEAST, CLUE

## 2022-06-18 LAB — CULTURE INDICATED

## 2022-06-18 NOTE — Telephone Encounter (Signed)
I recommend office evaluation

## 2022-06-18 NOTE — Telephone Encounter (Signed)
Pt notified and voiced understanding. She was transferred to appt desk.

## 2022-06-18 NOTE — Telephone Encounter (Signed)
Pt had UA/ucx performed on 06/08/2022-and was tx'd w/ macrobid. Pt states that she completed rx on Monday and is still experiencing mild dysuria and odorous urine, pt wondering if she needs another round of abxs or if she needs another UA/ucx and/or evaluation. Please advise.

## 2022-06-18 NOTE — Telephone Encounter (Signed)
Pt scheduled today @ 430 pm w/ ML. Will route to providers for final review and close encounter.

## 2022-06-18 NOTE — Progress Notes (Signed)
    Phyllis Morgan [birth year] [id number]        62 y.o.  [id number]   RP: Burning with urination and odor  HPI: Burning with urination and odor.  Just had an E. Coli acute cystitis on 06/08/22, treated with Nitrofurantoin.  U. Culture was pos to E. Coli, sensitive to Nitrofurantoin.  No pelvic pain.  No fever.   OB History  Gravida Para Term Preterm AB Living  3 1 1   2 1   SAB IAB Ectopic Multiple Live Births  2            # Outcome Date GA Lbr Len/2nd Weight Sex Delivery Anes PTL Lv  3 SAB           2 SAB           1 Term             Past medical history,surgical history, problem list, medications, allergies, family history and social history were all reviewed and documented in the EPIC chart.   Directed ROS with pertinent positives and negatives documented in the history of present illness/assessment and plan.  Exam:  Vitals:   06/18/22 1622  BP: 110/68  Pulse: (!) 102  Temp: 98.9 F (37.2 C)  TempSrc: Oral  SpO2: 98%   General appearance:  Normal  CVAT Neg bilaterally  Abdomen: Normal  Gynecologic exam: Vulva normal.  Normal secretions.  Wet prep done.  U/A: Yellow clear, Nit Neg, Pro Neg, WBC 0-5, RBC Neg, Bacteria few.  U. Culture pending. Wet prep Neg.   Assessment/Plan:  62 y.o. [id number]   1. Burning with urination Burning with urination and odor.  Just had an E. Coli acute cystitis on 06/08/22, treated with Nitrofurantoin.  U. Culture was pos to E. Coli, sensitive to Nitrofurantoin.  No pelvic pain.  No fever.  U/A with no significant abnormality.  Decision to wait on U. Culture to decide if ABTx are needed.  Continue with good water intake. - Urinalysis,Complete w/RFL Culture  2. Abnormal urine odor Wet prep Negative. - WET PREP FOR TRICH, YEAST, CLUE  Other orders - Urine Culture - REFLEXIVE URINE CULTURE   Genia Del MD, 4:45 PM 06/18/2022

## 2022-06-20 LAB — URINALYSIS, COMPLETE W/RFL CULTURE
Bilirubin Urine: NEGATIVE
Glucose, UA: NEGATIVE
Hgb urine dipstick: NEGATIVE
Hyaline Cast: NONE SEEN /LPF
Ketones, ur: NEGATIVE
Nitrites, Initial: NEGATIVE
Protein, ur: NEGATIVE
RBC / HPF: NONE SEEN /HPF (ref 0–2)
Specific Gravity, Urine: 1.01 (ref 1.001–1.035)
pH: 6 (ref 5.0–8.0)

## 2022-06-20 LAB — URINE CULTURE
MICRO NUMBER:: 14965623
SPECIMEN QUALITY:: ADEQUATE

## 2022-06-24 NOTE — Telephone Encounter (Signed)
Opened in error

## 2022-06-26 ENCOUNTER — Telehealth: Payer: Self-pay | Admitting: *Deleted

## 2022-06-26 NOTE — Telephone Encounter (Signed)
Patient notified of results per Dr. Seymour Bars, see result note dated 06/26/22.   Encounter closed.

## 2022-06-26 NOTE — Telephone Encounter (Signed)
Patient left message requesting return to discuss urinalysis results from OV 06/18/22.   Spoke with patient. Patient states she reviewed results on MyChart. Patient expressed concern that she has not been contacted regarding results.   Reviewed UA and culture results with patient. Patient denies any new or worsening symptoms. Reports continued burning with urination. Advised patient I will forward to Dr. Seymour Bars to review and our office will return call once reviewed. Patient verbalizes understanding.   Routing to Dr. Seymour Bars to review results and advise.

## 2022-06-30 NOTE — Progress Notes (Signed)
62 y.o. G67P1021 Married Caucasian female here for annual exam.  Pt wants to discuss Paxil and hot flashes.  Her current hot flashes are infrequent but they do wake her up at night.   Used HRT a decade ago and stopped with her chemotherapy.   She may start Opdivo in a couple of months.   Some stress due to potential new recurrence of her disease.  She is seeing a therapist.  Denies suicidal ideation.  PCP:   Candie Chroman, FNP  Patient's last menstrual period was 10/10/2011.           Sexually active: Yes.    The current method of family planning is tubal ligation/PMP.    Exercising: Yes.     PT, walking Smoker:  no  Health Maintenance: Pap:  08/15/20 neg: HR HPV neg, 08/11/18 neg: HR HPV neg History of abnormal Pap:  no MMG:  03/23/22 Breast Density Cat C, BI-RADS CAT 1 neg Colonoscopy:  2023 per pt and will do one again this year - [facility], 12/15/17 BMD:   11/10/18  Result  osteopenia TDaP:  07/04/18 Gardasil:   no HIV: n/a Hep C: 04/14/17 NR Screening Labs:  PCP   reports that she has never smoked. She has never used smokeless tobacco. She reports that she does not use drugs.  Past Medical History:  Diagnosis Date   Asthma    exercise induced   Biliary dyskinesia    Celiac disease    Celiac disease 2013   Colon polyps    Dyspnea    Dysrhythmia    PVCs   Gastric polyps    GERD (gastroesophageal reflux disease)    Headache    History of kidney stones    Hx of pulmonary embolus    IDA (iron deficiency anemia)    Melanoma (HCC)    on nose   Osteopenia 11/2018   T score -1.3 FRAX 6.5% / 0.4% stable from prior DEXA   Pneumonia    as a child   PONV (postoperative nausea and vomiting)    Seasonal allergies     Past Surgical History:  Procedure Laterality Date   BRONCHIAL BIOPSY  12/09/2020   Procedure: BRONCHIAL BIOPSIES;  Surgeon: Leslye Peer, MD;  Location: [facility] ENDOSCOPY;  Service: Pulmonary;;   BRONCHIAL BRUSHINGS  12/09/2020   Procedure:  BRONCHIAL BRUSHINGS;  Surgeon: Leslye Peer, MD;  Location: [facility] ENDOSCOPY;  Service: Pulmonary;;   BRONCHIAL NEEDLE ASPIRATION BIOPSY  12/09/2020   Procedure: BRONCHIAL NEEDLE ASPIRATION BIOPSIES;  Surgeon: Leslye Peer, MD;  Location: [facility] ENDOSCOPY;  Service: Pulmonary;;   BRONCHIAL WASHINGS  12/09/2020   Procedure: BRONCHIAL WASHINGS;  Surgeon: Leslye Peer, MD;  Location: [facility] ENDOSCOPY;  Service: Pulmonary;;   CATARACT EXTRACTION Left 05/2022   CATARACT EXTRACTION, BILATERAL     cataract surgery Right    CESAREAN SECTION     CHOLECYSTECTOMY  2007   ENDOMETRIAL ABLATION  12/21/2006   HER OPTION CRYOABLATION    HAND SURGERY     THUMB/RIGHT HAND   KNEE ARTHROSCOPY Left 11/26/2020   Procedure: LEFT KNEE ARTHROSCOPY, PARTIAL MEDIAL MENISECTOMY;  Surgeon: Marcene Corning, MD;  Location: [facility] ORS;  Service: Orthopedics;  Laterality: Left;   KNEE SURGERY Right 2019   Left eye retinal tear  12/18/2018   [facility]   MOHS SURGERY     nose (melanoma)   OVARIAN CYST REMOVAL Right 1993   Rt Ovarian Cystectomy   PELVIC LAPAROSCOPY  OVARIAN CYSTECTOMY   TUBAL LIGATION  12/2002   FALLOPE RINGS   UMBILICAL HERNIA REPAIR     VIDEO BRONCHOSCOPY WITH ENDOBRONCHIAL NAVIGATION N/A 12/09/2020   Procedure: VIDEO BRONCHOSCOPY WITH ENDOBRONCHIAL NAVIGATION;  Surgeon: Leslye Peer, MD;  Location: [facility] ENDOSCOPY;  Service: Pulmonary;  Laterality: N/A;    Current Outpatient Medications  Medication Sig Dispense Refill   acetaminophen (TYLENOL) 500 MG tablet Take 1,000 mg by mouth every 6 (six) hours as needed (for pain/headaches.).     albuterol (VENTOLIN HFA) 108 (90 Base) MCG/ACT inhaler INHALE 2 PUFFS INTO THE LUNGS EVERY 6 HOURS AS NEEDED FOR WHEEZING OR SHORTNESS OF BREATH 54 g 0   amoxicillin (AMOXIL) 875 MG tablet Take by mouth.     Baclofen 5 MG TABS Take 1 tablet by mouth 2 (two) times daily.     cetirizine (ZYRTEC) 10 MG tablet Take 10 mg by mouth daily.      cholecalciferol (VITAMIN D) 25 MCG (1000 UNIT) tablet Take 1,000 Units by mouth daily.     dexlansoprazole (DEXILANT) 60 MG capsule TAKE 1 CAPSULE BY MOUTH EVERY MORNING 90 capsule 4   diphenhydrAMINE (BENADRYL) 25 MG tablet Take 25 mg by mouth every 6 (six) hours as needed.     fluticasone (FLONASE) 50 MCG/ACT nasal spray Place 2 sprays into the nose in the morning.     guaifenesin (HUMIBID E) 400 MG TABS tablet Take 400 mg by mouth as needed (congestion).     LORazepam (ATIVAN) 1 MG tablet Take by mouth.     Multiple Vitamin (MULTIVITAMIN WITH MINERALS) TABS tablet Take 1 tablet by mouth daily.     phenylephrine (SUDAFED PE) 10 MG TABS tablet Take 10 mg by mouth every 4 (four) hours as needed (sinus congestion.).     polyethylene glycol (MIRALAX / GLYCOLAX) 17 g packet Take by mouth as needed for moderate constipation.     No current facility-administered medications for this visit.    Family History  Problem Relation Age of Onset   Hypertension Mother    Heart disease Mother    Cancer Mother        SKIN CANCER   Celiac disease Mother    Hypertension Father    Cancer Father        COLON   Heart disease Maternal Grandmother    Cancer Paternal Grandmother        Female cancer  ?type   Healthy Daughter    Colon cancer Neg Hx    Esophageal cancer Neg Hx    Rectal cancer Neg Hx    Stomach cancer Neg Hx     Review of Systems  All other systems reviewed and are negative.   Exam:   BP 122/82   Pulse 78   Ht 5\' 4"  (1.626 m)   Wt 144 lb (65.3 kg)   LMP 10/10/2011 Comment: sexually active, btl  SpO2 99%   BMI 24.72 kg/m     General appearance: alert, cooperative and appears stated age Head: normocephalic, without obvious abnormality, atraumatic Neck: no adenopathy, supple, symmetrical, trachea midline and thyroid normal to inspection and palpation Lungs: clear to auscultation bilaterally Breasts: normal appearance, no masses or tenderness, No nipple retraction or dimpling, No  nipple discharge or bleeding, No axillary adenopathy Heart: regular rate and rhythm Abdomen: soft, non-tender; no masses, no organomegaly Extremities: extremities normal, atraumatic, no cyanosis or edema Skin: skin color, texture, turgor normal. No rashes or lesions Lymph nodes: cervical, supraclavicular, and axillary nodes normal.  Neurologic: grossly normal  Pelvic: External genitalia:  no lesions              No abnormal inguinal nodes palpated.              Urethra:  normal appearing urethra with no masses, tenderness or lesions              Bartholins and Skenes: normal                 Vagina: normal appearing vagina with normal color and discharge, no lesions              Cervix: no lesions              Pap taken: no Bimanual Exam:  Uterus:  normal size, contour, position, consistency, mobility, non-tender              Adnexa: no mass, fullness, tenderness              Rectal exam: yes.  Confirms.              Anus:  normal sphincter tone, no lesions  Chaperone was present for exam:  Warren Lacy, CMA  Assessment:   Well woman visit with gynecologic exam. Status post BTL. Status post endometrial ablation.  Hx ovarian cystectomy.  Lymphomatoid granulomatosis.  Health stress.  Menopausal symptoms.  Osteopenia.   Plan: Mammogram screening discussed. Self breast awareness reviewed. Pap and HR HPV as above. Guidelines for Calcium, Vitamin D, regular exercise program including cardiovascular and weight bearing exercise. Start Paxil 10 mg daily.   She will pursue a BMD at [facility].  Fu in 8 weeks for a recheck of Paxil. Follow up annually and prn.

## 2022-07-13 ENCOUNTER — Ambulatory Visit (INDEPENDENT_AMBULATORY_CARE_PROVIDER_SITE_OTHER): Payer: BC Managed Care – PPO | Admitting: Obstetrics and Gynecology

## 2022-07-13 ENCOUNTER — Encounter: Payer: Self-pay | Admitting: Obstetrics and Gynecology

## 2022-07-13 VITALS — BP 122/82 | HR 78 | Ht 64.0 in | Wt 144.0 lb

## 2022-07-13 DIAGNOSIS — Z01419 Encounter for gynecological examination (general) (routine) without abnormal findings: Secondary | ICD-10-CM | POA: Diagnosis not present

## 2022-07-13 DIAGNOSIS — N951 Menopausal and female climacteric states: Secondary | ICD-10-CM | POA: Diagnosis not present

## 2022-07-13 MED ORDER — PAROXETINE HCL 10 MG PO TABS
10.0000 mg | ORAL_TABLET | ORAL | 2 refills | Status: DC
Start: 2022-07-13 — End: 2022-10-19

## 2022-07-13 NOTE — Patient Instructions (Addendum)
EXERCISE AND DIET:  We recommended that you start or continue a regular exercise program for good health. Regular exercise means any activity that makes your heart beat faster and makes you sweat.  We recommend exercising at least 30 minutes per day at least 3 days a week, preferably 4 or 5.  We also recommend a diet low in fat and sugar.  Inactivity, poor dietary choices and obesity can cause diabetes, heart attack, stroke, and kidney damage, among others.    ALCOHOL AND SMOKING:  Women should limit their alcohol intake to no more than 7 drinks/beers/glasses of wine (combined, not each!) per week. Moderation of alcohol intake to this level decreases your risk of breast cancer and liver damage. And of course, no recreational drugs are part of a healthy lifestyle.  And absolutely no smoking or even second hand smoke. Most people know smoking can cause heart and lung diseases, but did you know it also contributes to weakening of your bones? Aging of your skin?  Yellowing of your teeth and nails?  CALCIUM AND VITAMIN D:  Adequate intake of calcium and Vitamin D are recommended.  The recommendations for exact amounts of these supplements seem to change often, but generally speaking 600 mg of calcium (either carbonate or citrate) and 800 units of Vitamin D per day seems prudent. Certain women may benefit from higher intake of Vitamin D.  If you are among these women, your doctor will have told you during your visit.    PAP SMEARS:  Pap smears, to check for cervical cancer or precancers,  have traditionally been done yearly, although recent scientific advances have shown that most women can have pap smears less often.  However, every woman still should have a physical exam from her gynecologist every year. It will include a breast check, inspection of the vulva and vagina to check for abnormal growths or skin changes, a visual exam of the cervix, and then an exam to evaluate the size and shape of the uterus and  ovaries.  And after 60 years of age, a rectal exam is indicated to check for rectal cancers. We will also provide age appropriate advice regarding health maintenance, like when you should have certain vaccines, screening for sexually transmitted diseases, bone density testing, colonoscopy, mammograms, etc.   MAMMOGRAMS:  All women over 60 years old should have a yearly mammogram. Many facilities now offer a "3D" mammogram, which may cost around $50 extra out of pocket. If possible,  we recommend you accept the option to have the 3D mammogram performed.  It both reduces the number of women who will be called back for extra views which then turn out to be normal, and it is better than the routine mammogram at detecting truly abnormal areas.    COLONOSCOPY:  Colonoscopy to screen for colon cancer is recommended for all women at age 60.  We know, you hate the idea of the prep.  We agree, BUT, having colon cancer and not knowing it is worse!!  Colon cancer so often starts as a polyp that can be seen and removed at colonscopy, which can quite literally save your life!  And if your first colonoscopy is normal and you have no family history of colon cancer, most women don't have to have it again for 10 years.  Once every ten years, you can do something that may end up saving your life, right?  We will be happy to help you get it scheduled when you are ready.    Be sure to check your insurance coverage so you understand how much it will cost.  It may be covered as a preventative service at no cost, but you should check your particular policy.    Paroxetine Tablets What is this medication? PAROXETINE (pa ROX e teen) treats depression, anxiety, obsessive-compulsive disorder (OCD), post-traumatic stress disorder (PTSD), and premenstrual dysphoric disorder (PMDD). It increases the amount of serotonin in the brain, a hormone that helps regulate mood. It belongs to a group of medications called SSRIs. This medicine may be  used for other purposes; ask your health care provider or pharmacist if you have questions. COMMON BRAND NAME(S): Paxil, Pexeva What should I tell my care team before I take this medication? They need to know if you have any of these conditions: Bipolar disorder or a family history of bipolar disorder Bleeding disorder Glaucoma Heart disease Kidney disease Liver disease Low levels of sodium in the blood Seizures Suicidal thoughts, plans, or attempt by you or a family member Take MAOIs, such as Carbex, Eldepryl, Marplan, Nardil, and Parnate Take medications that treat or prevent blood clots Thyroid disease An unusual or allergic reaction to paroxetine, other medications, foods, dyes, or preservatives Pregnant or trying to get pregnant Breastfeeding How should I use this medication? Take this medication by mouth with water. Take it as directed on the prescription label at the same time every day. You can take it with or without food. If it upsets your stomach, take it with food. Do not take your medication more often than directed. Keep taking this medication unless your care team tells you to stop. Stopping it too quickly can cause serious side effects. It can also make your condition worse. A special MedGuide will be given to you by the pharmacist with each prescription and refill. Be sure to read this information carefully each time. Talk to your care team about the use of this medication in children. Special care may be needed. Overdosage: If you think you have taken too much of this medicine contact a poison control center or emergency room at once. NOTE: This medicine is only for you. Do not share this medicine with others. What if I miss a dose? If you miss a dose, take it as soon as you can. If it is almost time for your next dose, take only that dose. Do not take double or extra doses. What may interact with this medication? Do not take this medication with any of the  following: Linezolid MAOIs, such as Carbex, Eldepryl, Marplan, Nardil, and Parnate Methylene blue (injected into a vein) Pimozide Thioridazine This medication may also interact with the following: Alcohol Amphetamines Aspirin and aspirin-like medications Atomoxetine Certain medications for irregular heart beat, such as propafenone, flecainide, encainide, and quinidine Certain medications for mental health conditions Certain medications for migraine headache, such as almotriptan, eletriptan, frovatriptan, naratriptan, rizatriptan, sumatriptan, zolmitriptan Cimetidine Digoxin Diuretics Fentanyl Fosamprenavir Furazolidone Isoniazid Lithium Medications that treat or prevent blood clots, such as warfarin, enoxaparin, and dalteparin Medications for sleep NSAIDs, medications for pain and inflammation, such as ibuprofen or naproxen Phenobarbital Phenytoin Procarbazine Rasagiline Ritonavir Supplements, such as St. John's wort, kava kava, valerian Tamoxifen Tramadol Tryptophan This list may not describe all possible interactions. Give your health care provider a list of all the medicines, herbs, non-prescription drugs, or dietary supplements you use. Also tell them if you smoke, drink alcohol, or use illegal drugs. Some items may interact with your medicine. What should I watch for while using this medication?  Tell your care team if your symptoms do not get better or if they get worse. Visit your care team for regular checks on your progress. Because it may take several weeks to see the full effects of this medication, it is important to continue your treatment as prescribed by your care team. Watch for new or worsening thoughts of suicide or depression. This includes sudden changes in mood, behaviors, or thoughts. These changes can happen at any time but are more common in the beginning of treatment or after a change in dose. Call your care team right away if you experience these thoughts  or worsening depression. This medication may cause mood and behavior changes, such as anxiety, nervousness, irritability, hostility, restlessness, excitability, hyperactivity, or trouble sleeping. These changes can happen at any time but are more common in the beginning of treatment or after a change in dose. Call your care team right away if you notice any of these symptoms. This medication may affect your coordination, reaction time, or judgment. Do not drive or operate machinery until you know how this medication affects you. Sit up or stand slowly to reduce the risk of dizzy or fainting spells. Drinking alcohol with this medication can increase the risk of these side effects. Your mouth may get dry. Chewing sugarless gum or sucking hard candy and drinking plenty of water may help. Contact your care team if the problem does not go away or is severe. What side effects may I notice from receiving this medication? Side effects that you should report to your care team as soon as possible: Allergic reactions--skin rash, itching, hives, swelling of the face, lips, tongue, or throat Bleeding--bloody or black, tar-like stools, red or dark brown urine, vomiting blood or brown material that looks like coffee grounds, small, red or purple spots on skin, unusual bleeding or bruising Heart rhythm changes--fast or irregular heartbeat, dizziness, feeling faint or lightheaded, chest pain, trouble breathing Low sodium level--muscle weakness, fatigue, dizziness, headache, confusion Serotonin syndrome--irritability, confusion, fast or irregular heartbeat, muscle stiffness, twitching muscles, sweating, high fever, seizures, chills, vomiting, diarrhea Sudden eye pain or change in vision such as blurry vision, seeing halos around lights, vision loss Thoughts of suicide or self-harm, worsening mood, feelings of depression Side effects that usually do not require medical attention (report to your care team if they continue  or are bothersome): Change in sex drive or performance Diarrhea Excessive sweating Nausea Tremors or shaking Upset stomach This list may not describe all possible side effects. Call your doctor for medical advice about side effects. You may report side effects to FDA at 1-800-FDA-1088. Where should I keep my medication? Keep out of the reach of children and pets. Store at room temperature between 15 and 30 degrees C (59 and 86 degrees F). Keep container tightly closed. Throw away any unused medication after the expiration date. NOTE: This sheet is a summary. It may not cover all possible information. If you have questions about this medicine, talk to your doctor, pharmacist, or health care provider.  2024 Elsevier/Gold Standard (2021-11-24 00:00:00)

## 2022-08-17 ENCOUNTER — Encounter: Payer: Self-pay | Admitting: Obstetrics and Gynecology

## 2022-08-24 ENCOUNTER — Encounter: Payer: Self-pay | Admitting: Obstetrics and Gynecology

## 2022-08-24 NOTE — Telephone Encounter (Signed)
FYI.  Will route to provider for review.

## 2022-08-26 ENCOUNTER — Ambulatory Visit: Payer: BC Managed Care – PPO | Admitting: Obstetrics and Gynecology

## 2022-10-19 ENCOUNTER — Encounter: Payer: Self-pay | Admitting: Obstetrics and Gynecology

## 2022-10-19 ENCOUNTER — Ambulatory Visit (INDEPENDENT_AMBULATORY_CARE_PROVIDER_SITE_OTHER): Payer: BC Managed Care – PPO | Admitting: Obstetrics and Gynecology

## 2022-10-19 VITALS — BP 110/64 | HR 81 | Temp 98.4°F

## 2022-10-19 DIAGNOSIS — R300 Dysuria: Secondary | ICD-10-CM

## 2022-10-19 DIAGNOSIS — N390 Urinary tract infection, site not specified: Secondary | ICD-10-CM

## 2022-10-19 MED ORDER — FLUCONAZOLE 150 MG PO TABS
150.0000 mg | ORAL_TABLET | Freq: Once | ORAL | 0 refills | Status: AC
Start: 2022-10-19 — End: 2022-10-19

## 2022-10-19 MED ORDER — ESTRADIOL 0.1 MG/GM VA CREA
1.0000 | TOPICAL_CREAM | Freq: Every day | VAGINAL | 12 refills | Status: DC
Start: 2022-10-19 — End: 2022-10-19

## 2022-10-19 MED ORDER — NITROFURANTOIN MONOHYD MACRO 100 MG PO CAPS
100.0000 mg | ORAL_CAPSULE | Freq: Two times a day (BID) | ORAL | 5 refills | Status: DC
Start: 2022-10-19 — End: 2022-11-20

## 2022-10-19 MED ORDER — ESTRADIOL 0.1 MG/GM VA CREA
1.0000 | TOPICAL_CREAM | Freq: Every day | VAGINAL | 0 refills | Status: DC
Start: 2022-10-19 — End: 2023-02-02

## 2022-10-19 NOTE — Progress Notes (Unsigned)
Patient presents for recurrent uti's and dysuria today.  Reports q 2months. Has felt better after macrobid. UTI's started after chemo and being hospitalized in July, 2023 She was on HRT for a long time and stopped with chemo and then got a blood clot after stopping last year.  Blood pressure 110/64, pulse 81, temperature 98.4 F (36.9 C), temperature source Oral, last menstrual period 10/10/2011, SpO2 98%.  UA with leuks and bacteria  A/p UTI, frequent UTI's starting after chemo  To begin a low dose of vaginal estrogen (pea size amount) at the area of the bladder 3 times a week.  Macrobid sent in with diflucan incase of yeast infection after the antibiotic. UC sent.  Referral placed to urogyn for evaluation. RTC at any time with worsening or persistent s/s.  Dr. Karma Greaser

## 2022-10-20 ENCOUNTER — Telehealth: Payer: Self-pay

## 2022-10-20 NOTE — Telephone Encounter (Signed)
Pt LVM in triage line stating that she called appt desk and was told that there were no appt times avail yesterday for an OV but feels as if she has a UTI and was inquiring if she could at least drop by to leave a sample?  Pt was seen in office w/ EB yesterday @ 415pm. Will close encounter.

## 2022-10-21 LAB — URINALYSIS, COMPLETE W/RFL CULTURE
Bilirubin Urine: NEGATIVE
Glucose, UA: NEGATIVE
Hgb urine dipstick: NEGATIVE
Hyaline Cast: NONE SEEN /LPF
Ketones, ur: NEGATIVE
Nitrites, Initial: NEGATIVE
Protein, ur: NEGATIVE
RBC / HPF: NONE SEEN /HPF (ref 0–2)
Specific Gravity, Urine: 1.01 (ref 1.001–1.035)
pH: 6 (ref 5.0–8.0)

## 2022-10-21 LAB — CULTURE INDICATED

## 2022-10-23 ENCOUNTER — Encounter: Payer: Self-pay | Admitting: Obstetrics and Gynecology

## 2022-10-26 NOTE — Telephone Encounter (Signed)
Dr. Karma Greaser, please advise on abx Rx.

## 2022-10-26 NOTE — Telephone Encounter (Signed)
Per review of EPIC, referral placed to Urogynecology on 10/19/22.   Routing to Dr. Karma Greaser to advise on 10/19/22 results.   Cc: Bianca to f/u on referral

## 2022-10-26 NOTE — Telephone Encounter (Signed)
Dr. Karma Greaser -Disregard last message. Abx sent on 10/19/22.

## 2022-11-20 ENCOUNTER — Ambulatory Visit (INDEPENDENT_AMBULATORY_CARE_PROVIDER_SITE_OTHER): Payer: BC Managed Care – PPO | Admitting: Obstetrics and Gynecology

## 2022-11-20 ENCOUNTER — Encounter: Payer: Self-pay | Admitting: Obstetrics and Gynecology

## 2022-11-20 VITALS — BP 101/64 | HR 85 | Ht 63.5 in | Wt 142.0 lb

## 2022-11-20 DIAGNOSIS — R350 Frequency of micturition: Secondary | ICD-10-CM | POA: Diagnosis not present

## 2022-11-20 DIAGNOSIS — R300 Dysuria: Secondary | ICD-10-CM

## 2022-11-20 LAB — POCT URINALYSIS DIPSTICK
Bilirubin, UA: NEGATIVE
Blood, UA: NEGATIVE
Glucose, UA: NEGATIVE
Ketones, UA: NEGATIVE
Leukocytes, UA: NEGATIVE
Nitrite, UA: NEGATIVE
Protein, UA: NEGATIVE
Spec Grav, UA: 1.01 (ref 1.010–1.025)
Urobilinogen, UA: 0.2 U/dL
pH, UA: 6.5 (ref 5.0–8.0)

## 2022-11-20 NOTE — Progress Notes (Signed)
New Patient Evaluation and Consultation  Referring Provider: Earley Favor, MD PCP: Olive Bass, FNP Date of Service: 11/20/2022  SUBJECTIVE Chief Complaint: New Patient (Initial Visit) Phyllis Morgan is a 62 y.o. female is here for frequent UTI's)  History of Present Illness: Phyllis Morgan is a 62 y.o. White or Caucasian female seen in consultation at the request of Dr Karma Greaser for evaluation of recurrent UTI.    Review of records significant for: Started on vaginal estrogen cream 3 times a week for UTIs ad dysuria.   Urinary Symptoms: Leaks urine with with a full bladder and with urgency Leaks infrequently Pad use:  liners/ mini-pads  Patient is bothered by UI symptoms.  Day time voids 5-6.  Nocturia: 2-6 times per night to void. Voiding dysfunction:  does not empty bladder well.  Patient does not use a catheter to empty bladder.  When urinating, patient feels a weak stream, difficulty starting urine stream, and the need to urinate multiple times in a row  UTIs: 5 UTI's in the last year.   Denies history of blood in urine and kidney or bladder stones  Urine cultures:  10/19/22- <10,000 gram neg organism 06/18/22- <10,000 gram positive organism Also reports she had a positive UTI at urgent care in July.   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 She was catheterized while in the hospital and since then has had UTI symptoms of urgency, frequency and burning. Now has less urgency and leakage is not as frequent. She started estrace cream about a month ago.   Pelvic Organ Prolapse Symptoms:                  Patient Denies a feeling of a bulge the vaginal area.   Bowel Symptom: Bowel movements: 4-5 time(s) per week Stool consistency: soft  Straining: yes.  Splinting: yes.  Incomplete evacuation: yes.  Patient Denies accidental bowel leakage / fecal incontinence Bowel regimen: diet and miralax   Sexual  Function Sexually active: no.  Sexual orientation: Straight   Pelvic Pain Denies pelvic pain  Past Medical History:  Past Medical History:  Diagnosis Date   Asthma    exercise induced   Biliary dyskinesia    Celiac disease    Celiac disease 2013   Colon polyps    Dyspnea    Dysrhythmia    PVCs   Gastric polyps    GERD (gastroesophageal reflux disease)    Headache    History of kidney stones    Hx of pulmonary embolus    IDA (iron deficiency anemia)    Lymphoid granulomatosis (HCC)    Melanoma (HCC)    on nose   Osteopenia 11/2018   T score -1.3 FRAX 6.5% / 0.4% stable from prior DEXA   Pneumonia    as a child   PONV (postoperative nausea and vomiting)    Seasonal allergies      Past Surgical History:   Past Surgical History:  Procedure Laterality Date   BRONCHIAL BIOPSY  12/09/2020   Procedure: BRONCHIAL BIOPSIES;  Surgeon: Leslye Peer, MD;  Location: [facility] ENDOSCOPY;  Service: Pulmonary;;   BRONCHIAL BRUSHINGS  12/09/2020   Procedure: BRONCHIAL BRUSHINGS;  Surgeon: Leslye Peer, MD;  Location: [facility] ENDOSCOPY;  Service: Pulmonary;;   BRONCHIAL NEEDLE ASPIRATION BIOPSY  12/09/2020   Procedure: BRONCHIAL NEEDLE ASPIRATION BIOPSIES;  Surgeon: Leslye Peer, MD;  Location: [facility] ENDOSCOPY;  Service: Pulmonary;;   BRONCHIAL WASHINGS  12/09/2020  Procedure: BRONCHIAL WASHINGS;  Surgeon: Leslye Peer, MD;  Location[facility] ENDOSCOPY;  Service: Pulmonary;;   CATARACT EXTRACTION Left 05/2022   CATARACT EXTRACTION, BILATERAL     cataract surgery Right    CESAREAN SECTION     CHOLECYSTECTOMY  2007   ENDOMETRIAL ABLATION  12/21/2006   HER OPTION CRYOABLATION    HAND SURGERY     THUMB/RIGHT HAND   KNEE ARTHROSCOPY Left 11/26/2020   Procedure: LEFT KNEE ARTHROSCOPY, PARTIAL MEDIAL MENISECTOMY;  Surgeon: Marcene Corning, MD;  Location: [facility] ORS;  Service: Orthopedics;  Laterality: Left;   KNEE SURGERY Right 2019   Left eye retinal tear  12/18/2018   [facility]   MOHS SURGERY  05/2022   nose (melanoma)   OVARIAN CYST REMOVAL Right 1993   Rt Ovarian Cystectomy   PELVIC LAPAROSCOPY     OVARIAN CYSTECTOMY   TUBAL LIGATION  12/2002   FALLOPE RINGS   UMBILICAL HERNIA REPAIR     VIDEO BRONCHOSCOPY WITH ENDOBRONCHIAL NAVIGATION N/A 12/09/2020   Procedure: VIDEO BRONCHOSCOPY WITH ENDOBRONCHIAL NAVIGATION;  Surgeon: Leslye Peer, MD;  Location: [facility] ENDOSCOPY;  Service: Pulmonary;  Laterality: N/A;     Past OB/GYN History: OB History  Gravida Para Term Preterm AB Living  3 1 1   2 1   SAB IAB Ectopic Multiple Live Births  2            # Outcome Date GA Lbr Len/2nd Weight Sex Type Anes PTL Lv  3 SAB           2 SAB           1 Term      CS-Unspec       Menopausal: Yes, at age 49, Denies vaginal bleeding since menopause     Component Value Date/Time   DIAGPAP  08/15/2020 1043    - Negative for intraepithelial lesion or malignancy (NILM)   HPVHIGH Negative 08/15/2020 1043   ADEQPAP  08/15/2020 1043    Satisfactory for evaluation; transformation zone component PRESENT.    Medications: Patient has a current medication list which includes the following prescription(s): acetaminophen, albuterol, baclofen, cetirizine, cholecalciferol, dexlansoprazole, diphenhydramine, escitalopram, estradiol, fluticasone, lorazepam, multivitamin with minerals, phenylephrine, and polyethylene glycol.   Allergies: Patient is allergic to gluten meal, avelox [moxifloxacin hcl in nacl], and other.   Social History:  Social History   Tobacco Use   Smoking status: Never   Smokeless tobacco: Never  Vaping Use   Vaping status: Never Used  Substance Use Topics   Alcohol use: Not Currently   Drug use: No    Relationship status: married Patient lives with husband.   Patient is employed . Regular exercise: No History of abuse: No  Family History:   Family History  Problem Relation Age of Onset   Hypertension Mother    Heart disease Mother     Cancer Mother        SKIN CANCER   Celiac disease Mother    Hypertension Father    Cancer Father        COLON   Heart disease Maternal Grandmother    Cancer Paternal Grandmother        Female cancer  ?type   Healthy Daughter    Colon cancer Neg Hx    Esophageal cancer Neg Hx    Rectal cancer Neg Hx    Stomach cancer Neg Hx      Review of Systems: Review of Systems  Constitutional:  Negative for  fever, malaise/fatigue and weight loss.  Respiratory:  Negative for cough, shortness of breath and wheezing.   Cardiovascular:  Negative for chest pain, palpitations and leg swelling.  Gastrointestinal:  Negative for abdominal pain and blood in stool.  Genitourinary:  Positive for dysuria.  Musculoskeletal:  Negative for myalgias.  Skin:  Negative for rash.  Neurological:  Negative for dizziness and headaches.  Endo/Heme/Allergies:  Does not bruise/bleed easily.       + hot flashes  Psychiatric/Behavioral:  Positive for depression. The patient is nervous/anxious.      OBJECTIVE Physical Exam: Vitals:   11/20/22 1100  BP: 101/64  Pulse: 85  Weight: 142 lb (64.4 kg)  Height: 5' 3.5" (1.613 m)    Physical Exam Constitutional:      General: She is not in acute distress. Pulmonary:     Effort: Pulmonary effort is normal.  Abdominal:     General: There is no distension.     Palpations: Abdomen is soft.     Tenderness: There is no abdominal tenderness. There is no rebound.  Musculoskeletal:        General: No swelling. Normal range of motion.  Skin:    General: Skin is warm and dry.     Findings: No rash.  Neurological:     Mental Status: She is alert and oriented to person, place, and time.  Psychiatric:        Mood and Affect: Mood normal.        Behavior: Behavior normal.      GU / Detailed Urogynecologic Evaluation:  Pelvic Exam: Normal external female genitalia; Bartholin's and Skene's glands normal in appearance; urethral meatus normal in appearance, no urethral  masses or discharge.   CST: negative   Speculum exam reveals normal vaginal mucosa with atrophy. Cervix normal appearance. Uterus normal single, nontender. Adnexa no mass, fullness, tenderness.    Pelvic floor strength II/V   POP-Q:   POP-Q  -3                                            Aa   -3                                           Ba  -8                                              C   2                                            Gh  4.5                                            Pb  8  tvl   -2                                            Ap  -2                                            Bp  -8                                              D      Rectal Exam:  Normal external rectum  Post-Void Residual (PVR) by Bladder Scan: In order to evaluate bladder emptying, we discussed obtaining a postvoid residual and patient agreed to this procedure.  Procedure: The ultrasound unit was placed on the patient's abdomen in the suprapubic region after the patient had voided.    Post Void Residual - 11/20/22 1114       Post Void Residual   Post Void Residual 0 mL              Laboratory Results: Lab Results  Component Value Date   COLORU yellow 11/20/2022   CLARITYU clear 11/20/2022   GLUCOSEUR Negative 11/20/2022   BILIRUBINUR negative 11/20/2022   KETONESU negative 11/20/2022   SPECGRAV 1.010 11/20/2022   RBCUR negative 11/20/2022   PHUR 6.5 11/20/2022   PROTEINUR Negative 11/20/2022   UROBILINOGEN 0.2 11/20/2022   LEUKOCYTESUR Negative 11/20/2022    Lab Results  Component Value Date   CREATININE 0.84 12/16/2020   CREATININE 0.87 12/12/2020   CREATININE 0.87 11/20/2020    No results found for: "HGBA1C"  Lab Results  Component Value Date   HGB 13.5 12/23/2020     ASSESSMENT AND PLAN Ms. Fatemi is a 62 y.o. with:  1. Urinary frequency   2. Dysuria     - Symptoms have been improving since  starting estrace cream. Continue estrace cream three times a week - If she has UTI symptoms, should call the office to give a urine sample for culture.  - For treatment of recurrent urinary tract infections, we discussed management of recurrent UTIs including prophylaxis with a daily low dose antibiotic, transvaginal estrogen therapy, probiotic, and cranberry supplements.  At this time, we do not have evidence of recurrent infections since the cultures were negative so will focus on non-antibiotic therapy.  - Can consider interstitial cystitis if she has recurrent symptoms with negative cultures.   Return 6 months or sooner if needed  Marguerita Beards, MD

## 2022-11-20 NOTE — Patient Instructions (Addendum)
Can take cranberry pills and/ or probiotic (look for one with lactobacillis crispatus) for prevention of urinary tract infection.  Continue with estrogen cream 3 times a week.   Today we talked about ways to manage bladder urgency such as altering your diet to avoid irritative beverages and foods (bladder diet) as well as attempting to decrease stress and other exacerbating factors.    The Most Bothersome Foods* The Least Bothersome Foods*  Coffee - Regular & Decaf Tea - caffeinated Carbonated beverages - cola, non-colas, diet & caffeine-free Alcohols - Beer, Red Wine, White Wine, [address] - Grapefruit, [place], Orange, [employer] - Cranberry, Grapefruit, Orange, Pineapple Vegetables - Tomato & Tomato Products Flavor Enhancers - Hot peppers, Spicy foods, Chili, Horseradish, Vinegar, Monosodium glutamate (MSG) Artificial Sweeteners - NutraSweet, Sweet 'N Low, Equal (sweetener), Saccharin Ethnic foods - [place], [place], [place] food [employer] - low-fat & whole Fruits - Bananas, Blueberries, Honeydew melon, Pears, Raisins, Watermelon Vegetables - Broccoli, [address] Sprouts, [place], Carrots, Cauliflower, [place], Cucumber, Mushrooms, Peas, Radishes, Squash, Zucchini, White potatoes, Sweet potatoes & yams Poultry - Chicken, Eggs, [place], [employer] - Beef, Diplomatic Services operational officer, Lamb Seafood - Shrimp, [place] fish, Salmon Grains - Oat, Rice Snacks - Pretzels, Popcorn  *Lenward Chancellor et al. Diet and its role in interstitial cystitis/bladder pain syndrome (IC/BPS) and comorbid conditions. [employer]. BJU Int. 2012 Jan 11.

## 2023-02-02 ENCOUNTER — Other Ambulatory Visit (HOSPITAL_COMMUNITY)
Admission: RE | Admit: 2023-02-02 | Discharge: 2023-02-02 | Disposition: A | Discharge status: Home / Self Care | Payer: BC Managed Care – PPO | Source: Other Acute Inpatient Hospital | Attending: Obstetrics and Gynecology | Admitting: Obstetrics and Gynecology

## 2023-02-02 ENCOUNTER — Ambulatory Visit (INDEPENDENT_AMBULATORY_CARE_PROVIDER_SITE_OTHER): Payer: BC Managed Care – PPO

## 2023-02-02 ENCOUNTER — Other Ambulatory Visit: Payer: Self-pay

## 2023-02-02 VITALS — BP 103/66 | HR 79

## 2023-02-02 DIAGNOSIS — N390 Urinary tract infection, site not specified: Secondary | ICD-10-CM | POA: Diagnosis present

## 2023-02-02 DIAGNOSIS — R300 Dysuria: Secondary | ICD-10-CM

## 2023-02-02 DIAGNOSIS — R319 Hematuria, unspecified: Secondary | ICD-10-CM | POA: Diagnosis present

## 2023-02-02 DIAGNOSIS — N952 Postmenopausal atrophic vaginitis: Secondary | ICD-10-CM

## 2023-02-02 MED ORDER — ESTRADIOL 0.1 MG/GM VA CREA: 1.0000 | TOPICAL_CREAM | Freq: Every day | VAGINAL | 0 refills | Status: DC

## 2023-02-02 MED ORDER — ESTRADIOL 0.1 MG/GM VA CREA
1.0000 | TOPICAL_CREAM | Freq: Every day | VAGINAL | 11 refills | Status: DC
Start: 2023-02-02 — End: 2023-11-23

## 2023-02-02 MED ORDER — PHENAZOPYRIDINE HCL 200 MG PO TABS: 200.0000 mg | ORAL_TABLET | Freq: Three times a day (TID) | ORAL | 0 refills | Status: AC | PRN

## 2023-02-02 MED ORDER — NITROFURANTOIN MONOHYD MACRO 100 MG PO CAPS: 100.0000 mg | ORAL_CAPSULE | Freq: Two times a day (BID) | ORAL | 0 refills | Status: DC

## 2023-02-02 NOTE — Progress Notes (Signed)
Your Urine dip that was done in office was Positive. I am sending the urine off for culture and you can take AZO over the counter for your discomfort.  We have also ordered Macrobid and pyridium for you to take while we wait for your culture results, hopefully this gives you some relief. We will contact you when the results are back between 3-5 days. If a different antibiotic is needed we will sent the order to the pharmacy and you will be notified. If you have any questions or concerns please feel free to call us at [phone]

## 2023-02-02 NOTE — Telephone Encounter (Signed)
 Med refill request: estradiol 0.1 mg vaginal cream Last OV: 10/19/22 EB Last AEX: 07/13/22 BS Next AEX: none scheduled Last MMG (if hormonal med) 03/23/22 BI-RADS 1 negative Refill sent to provider for approval or denial as appropriate.

## 2023-02-02 NOTE — Patient Instructions (Signed)
 Your Urine dip that was done in office was positive. I am sending the urine off for culture and a microscopic testing you can take AZO over the counter for your discomfort.  We have also ordered Mabrobid and Pyridium  for you to take while we wait for your culture results, hopefully this gives you some relief. We will contact you when the results are back between 3-5 days. If a different antibiotic is needed we will sent the order to the pharmacy and you will be notified. If you have any questions or concerns please feel free to call us  at [phone]

## 2023-02-02 NOTE — Addendum Note (Signed)
Addended by: Alexis Frock on: 02/02/2023 01:46 PM   Modules accepted: Orders

## 2023-02-03 DIAGNOSIS — M810 Age-related osteoporosis without current pathological fracture: Secondary | ICD-10-CM

## 2023-02-03 HISTORY — DX: Age-related osteoporosis without current pathological fracture: M81.0

## 2023-02-04 LAB — URINE CULTURE: Culture: >=100000 — AB

## 2023-02-04 NOTE — Telephone Encounter (Signed)
 Ordered by [employer]

## 2023-02-05 NOTE — Telephone Encounter (Signed)
 Pharmacy notified.

## 2023-02-09 ENCOUNTER — Ambulatory Visit (INDEPENDENT_AMBULATORY_CARE_PROVIDER_SITE_OTHER): Payer: 59

## 2023-02-09 VITALS — BP 97/63 | HR 83

## 2023-02-09 DIAGNOSIS — R350 Frequency of micturition: Secondary | ICD-10-CM

## 2023-02-09 DIAGNOSIS — R300 Dysuria: Secondary | ICD-10-CM

## 2023-02-09 LAB — POCT URINALYSIS DIPSTICK
Bilirubin, UA: NEGATIVE
Blood, UA: NEGATIVE
Glucose, UA: NEGATIVE
Ketones, UA: NEGATIVE
Leukocytes, UA: NEGATIVE
Nitrite, UA: NEGATIVE
Protein, UA: NEGATIVE
Spec Grav, UA: 1.015 (ref 1.010–1.025)
Urobilinogen, UA: 0.2 U/dL
pH, UA: 6.5 (ref 5.0–8.0)

## 2023-02-09 NOTE — Patient Instructions (Signed)
 We will reach out to you with further instructions.   A urine specimen was collected and POCT Urine was done.  POCT Urine was Negative

## 2023-02-09 NOTE — Progress Notes (Signed)
 Phyllis Morgan arrived today with UTI sx.  A urine specimen was collected and POCT Urine was done. POCT Urine was Negative. Patient states she is still having some symptoms, she indicates she is still having some mild burning and urinary frequency with urgency. Her last antibiotic was taken on Sunday morning. I have communicated with Dr. Marilynne for clinical advise, She states to use AZO for a few days as it could be some residue inflammation.patient has been notified and agrees to the plan of care.

## 2023-02-15 ENCOUNTER — Ambulatory Visit: Payer: 59 | Admitting: Obstetrics and Gynecology

## 2023-02-15 ENCOUNTER — Encounter: Payer: Self-pay | Admitting: Obstetrics and Gynecology

## 2023-02-15 VITALS — BP 103/66 | HR 76

## 2023-02-15 DIAGNOSIS — R300 Dysuria: Secondary | ICD-10-CM | POA: Diagnosis not present

## 2023-02-15 DIAGNOSIS — R3989 Other symptoms and signs involving the genitourinary system: Secondary | ICD-10-CM | POA: Diagnosis not present

## 2023-02-15 DIAGNOSIS — R350 Frequency of micturition: Secondary | ICD-10-CM

## 2023-02-15 DIAGNOSIS — R8281 Pyuria: Secondary | ICD-10-CM | POA: Diagnosis not present

## 2023-02-15 LAB — POCT URINALYSIS DIPSTICK
Bilirubin, UA: NEGATIVE
Blood, UA: NEGATIVE
Glucose, UA: NEGATIVE
Ketones, UA: NEGATIVE
Leukocytes, UA: NEGATIVE
Nitrite, UA: NEGATIVE
Protein, UA: NEGATIVE
Spec Grav, UA: <=1.005 — AB (ref 1.010–1.025)
Urobilinogen, UA: 0.2 U/dL
pH, UA: 6 (ref 5.0–8.0)

## 2023-02-15 MED ORDER — BUPIVACAINE HCL 0.25 % IJ SOLN
20.0000 mL | Freq: Once | INTRAMUSCULAR | Status: AC
  Administered 2023-02-15: 20 mL

## 2023-02-15 MED ORDER — LIDOCAINE HCL 2 % IJ SOLN
20.0000 mL | Freq: Once | INTRAMUSCULAR | Status: AC
  Administered 2023-02-15: 400 mg

## 2023-02-15 MED ORDER — HEPARIN SODIUM (PORCINE) 10000 UNIT/ML IJ SOLN
10000.0000 [IU] | Freq: Once | INTRAMUSCULAR | Status: AC
  Administered 2023-02-15: 10000 [IU] via INTRAVESICAL

## 2023-02-15 MED ORDER — SODIUM BICARBONATE 8.4 % IV SOLN
5.0000 mL | Freq: Once | INTRAVENOUS | Status: AC
  Administered 2023-02-15: 5 mL

## 2023-02-15 MED ORDER — VIBEGRON 75 MG PO TABS: 75.0000 mg | ORAL_TABLET | Freq: Every day | ORAL | 5 refills | Status: DC

## 2023-02-15 NOTE — Patient Instructions (Addendum)
 Start Gemtesa  75mg  daily for overactive bladder spasms and irritation.   When you are not using the estrogen cream, I suggest using vitamin E cream/oil or coconut oil.   You did not fully empty your bladder today. You had left in the bladder. At your next visit we should repeat this to see what the Post void residual is.

## 2023-02-15 NOTE — Progress Notes (Addendum)
 [facility] Return Visit  SUBJECTIVE  History of Present Illness: Phyllis Morgan is a 63 y.o. female seen in follow-up for pyuria and bladder irritation. Plan at last visit was culture urine and if there is no obvious signs of UTI on POC urine can consider treating as IC.    Patient reports significant burning and discomfort with urination. She also endorses urgency and frequency. She reports some relief with pyridium .    Past Medical History: Patient  has a past medical history of Asthma, Biliary dyskinesia, Celiac disease, Celiac disease (2013), Colon polyps, Dyspnea, Dysrhythmia, Gastric polyps, GERD (gastroesophageal reflux disease), Headache, History of kidney stones, pulmonary embolus, IDA (iron deficiency anemia), Lymphoid granulomatosis (HCC), Melanoma (HCC), Osteopenia (11/2018), Pneumonia, PONV (postoperative nausea and vomiting), and Seasonal allergies.   Past Surgical History: She  has a past surgical history that includes Cesarean section; Umbilical hernia repair; Hand surgery; Pelvic laparoscopy; Tubal ligation (12/2002); Endometrial ablation (12/21/2006); Cholecystectomy (2007); Ovarian cyst removal (Right, 1993); Knee surgery (Right, 2019); Left eye retinal tear (12/18/2018); Knee arthroscopy (Left, 11/26/2020); Video bronchoscopy with endobronchial navigation (N/A, 12/09/2020); Bronchial biopsy (12/09/2020); Bronchial brushings (12/09/2020); Bronchial needle aspiration biopsy (12/09/2020); Bronchial washings (12/09/2020); cataract surgery (Right); Mohs surgery (05/2022); Cataract extraction, bilateral; and Cataract extraction (Left, 05/2022).   Medications: She has a current medication list which includes the following prescription(s): acetaminophen , albuterol , baclofen, cetirizine, cholecalciferol, dexlansoprazole , diphenhydramine, escitalopram, estradiol , fluticasone, lorazepam , multivitamin with minerals, nitrofurantoin  (macrocrystal-monohydrate), phenazopyridine ,  phenylephrine , polyethylene glycol, and vibegron .   Allergies: Patient is allergic to gluten meal, avelox [moxifloxacin hcl in nacl], and other.   Social History: Patient  reports that she has never smoked. She has never used smokeless tobacco. She reports that she does not currently use alcohol. She reports that she does not use drugs.     OBJECTIVE    Lab Results  Component Value Date   COLORU yellow 02/15/2023   CLARITYU clear 02/15/2023   GLUCOSEUR Negative 02/15/2023   BILIRUBINUR negative 02/15/2023   KETONESU negative 02/15/2023   SPECGRAV <=1.005 (A) 02/15/2023   RBCUR negative 02/15/2023   PHUR 6.0 02/15/2023   PROTEINUR Negative 02/15/2023   UROBILINOGEN 0.2 02/15/2023   LEUKOCYTESUR Negative 02/15/2023    Physical Exam: Vitals:   02/15/23 1352  BP: 103/66  Pulse: 76   Gen: No apparent distress, A&O x 3.  Detailed Urogynecologic Evaluation:  External vaginal tissues are mildly red, but no sign of bleeding, discharge, mass or other concerning symptoms. Patient reports the most pain with palpation upon digital exam near the urethra. No obvious urethral diverticulum, polyp, or caruncle.   Bladder Instillation: The patient was identified and verbally consented for the procedure.  The urethra was prepped with Betadine x 3. A 16 Fr foley catheter was inserted the bladder and drained for 200 cc. The foley was then attached to a 60 mL syringe with the plunger removed.  The medication was slowly poured into the bladder via the syringe and foley.  The medication consisted of: 20ml of Lidocaine  2%, 20mL of Bupivicaine 0.25%, 10,000 units/mL Heparin , 5mL Sodium Bicarbonate  8.4%.  The foley was removed and the patient was asked to hold the liquids in her bladder for 30-60 minutes if possible.     ASSESSMENT AND PLAN    Phyllis Morgan is a 63 y.o. with:  1. Urinary frequency   2. Dysuria   3. Bladder pain   4. Pyuria    Point-of-care urine today negative for all signs  of infection.  This is  her second point of caring that she has had done since the last positive culture in December.  Will send this urine for culture just to rule out underlying UTI as she has had significant side effects with pyuria, loss of urine, and frequency. Patient has symptoms of OAB. Will start her on Gemtesa  75mg  daily to assist with this. She plans to clear this with her [facility] providers first due to her immunotherapy treatments.  Will treat today's episode of bladder irritation as a possible IC flare as she has had some positive improvement with pyridium /AZO. PVR immediately after patient urination was 200 which was concerning to patient. She described her urination as coming in waves.  Bladder installation done today. Will see if this is helpful for some of patients symptoms.  Discussed using estrogen cream at least twice weekly and using vitamin E cream/oil or coconut oil to give the vagina some lubrication and assist with atrophy/burning. Unable to replicate burning pain on exam at the vulvar region. It may be worth doing a compounded cream but patient feels uncertain as she has previously not had a good reaction to gabapentin. Another consideration would be to send the urine for pathnostics testing to rule out urethral viral pathogens.   Patient to follow up in 4 weeks or sooner if needed.    Phyllis Morgan G Kazmir Oki, NP

## 2023-03-15 ENCOUNTER — Ambulatory Visit: Payer: 59 | Admitting: Obstetrics and Gynecology

## 2023-03-15 ENCOUNTER — Encounter: Payer: Self-pay | Admitting: Obstetrics and Gynecology

## 2023-03-15 VITALS — BP 100/68 | HR 77

## 2023-03-15 DIAGNOSIS — R300 Dysuria: Secondary | ICD-10-CM | POA: Diagnosis not present

## 2023-03-15 DIAGNOSIS — R3989 Other symptoms and signs involving the genitourinary system: Secondary | ICD-10-CM

## 2023-03-15 DIAGNOSIS — R350 Frequency of micturition: Secondary | ICD-10-CM

## 2023-03-15 MED ORDER — HEPARIN SODIUM (PORCINE) 10000 UNIT/ML IJ SOLN
10000.0000 [IU] | Freq: Once | INTRAMUSCULAR | Status: AC
  Administered 2023-03-15: 10000 [IU] via INTRAVESICAL

## 2023-03-15 MED ORDER — BUPIVACAINE HCL 0.25 % IJ SOLN
20.0000 mL | Freq: Once | INTRAMUSCULAR | Status: AC
Start: 2023-03-15 — End: 2023-03-15
  Administered 2023-03-15: 20 mL

## 2023-03-15 MED ORDER — SODIUM BICARBONATE 8.4 % IV SOLN
5.0000 mL | Freq: Once | INTRAVENOUS | Status: AC
  Administered 2023-03-15: 5 mL

## 2023-03-15 MED ORDER — LIDOCAINE HCL 2 % IJ SOLN
20.0000 mL | Freq: Once | INTRAMUSCULAR | Status: AC
  Administered 2023-03-15: 400 mg

## 2023-03-15 NOTE — Progress Notes (Signed)
 [facility] Return Visit  SUBJECTIVE  History of Present Illness: SHIRLEYANN ILANO is a 63 y.o. female seen in follow-up for Bladder pain, vaginal atrophy, and OAB/Nocturia . Plan at last visit was to do a bladder installation, use vaginal estrogen cream with vitamin e cream in between and do Gemtesa  75mg  daily for OAB.    Patient reports she has been getting up less at night (most nights 2 times) and that she has less urgency. She reports she still has some mild pressure and a little burning at times but it is much better after the bladder installation.   Patient also reports she has been using the estrogen cream twice weekly.    Past Medical History: Patient  has a past medical history of Asthma, Biliary dyskinesia, Celiac disease, Celiac disease (2013), Colon polyps, Dyspnea, Dysrhythmia, Gastric polyps, GERD (gastroesophageal reflux disease), Headache, History of kidney stones, pulmonary embolus, IDA (iron deficiency anemia), Lymphoid granulomatosis (HCC), Melanoma (HCC), Osteopenia (11/2018), Pneumonia, PONV (postoperative nausea and vomiting), and Seasonal allergies.   Past Surgical History: She  has a past surgical history that includes Cesarean section; Umbilical hernia repair; Hand surgery; Pelvic laparoscopy; Tubal ligation (12/2002); Endometrial ablation (12/21/2006); Cholecystectomy (2007); Ovarian cyst removal (Right, 1993); Knee surgery (Right, 2019); Left eye retinal tear (12/18/2018); Knee arthroscopy (Left, 11/26/2020); Video bronchoscopy with endobronchial navigation (N/A, 12/09/2020); Bronchial biopsy (12/09/2020); Bronchial brushings (12/09/2020); Bronchial needle aspiration biopsy (12/09/2020); Bronchial washings (12/09/2020); cataract surgery (Right); Mohs surgery (05/2022); Cataract extraction, bilateral; and Cataract extraction (Left, 05/2022).   Medications: She has a current medication list which includes the following prescription(s): acetaminophen ,  albuterol , baclofen, cetirizine, cholecalciferol, dexlansoprazole , diphenhydramine, escitalopram, estradiol , fluticasone, lorazepam , multivitamin with minerals, phenazopyridine , phenylephrine , polyethylene glycol, and vibegron .   Allergies: Patient is allergic to gluten meal, avelox [moxifloxacin hcl in nacl], and other.   Social History: Patient  reports that she has never smoked. She has never used smokeless tobacco. She reports that she does not currently use alcohol. She reports that she does not use drugs.     OBJECTIVE     Physical Exam: Vitals:   03/15/23 1110  BP: 100/68  Pulse: 77   Gen: No apparent distress, A&O x 3.  Detailed Urogynecologic Evaluation:  No external abnormalities. Urethra normal and without discharge. No sepculum exam done.   Bladder Instillation: The patient was identified and verbally consented for the procedure.  The urethra was prepped with Betadine x 3. A 16 Fr foley catheter was inserted the bladder and drained for 180cc. The foley was then attached to a 60 mL syringe with the plunger removed.  The medication was slowly poured into the bladder via the syringe and foley.  The medication consisted of: 20ml of Lidocaine  2%, 20mL of Bupivicaine 0.25%, 10,000 units/mL Heparin , 5mL Sodium Bicarbonate  8.4%.  The foley was removed and the patient was asked to hold the liquids in her bladder for 30-60 minutes if possible.      ASSESSMENT AND PLAN    Ms. Crute is a 63 y.o. with:  1. Dysuria   2. Bladder pain   3. Urinary frequency    Bladder installation was successful in decreasing patient's symptoms. Another bladder installation completed today. Will plan for her to call if she feels she needs more support. She is soon to go for another treatment in her medical trial.  Patient to do PRN bladder installations. We discussed that times of high stress and anxiety or seasonal changes can cause flare-ups.  Patient reports this is  well controlled with her  Gemtesa  75mg  for the most part. Will continue medication at this time.    Patient to return as needed for bladder installations. Precautions were given and patient was instructed to call the office or on-call number for any concerns. Will follow up in 6 months or sooner if needed on medications.   Shaylyn Bawa G Aashika Carta, NP

## 2023-03-31 ENCOUNTER — Encounter: Payer: Self-pay | Admitting: Obstetrics and Gynecology

## 2023-04-16 ENCOUNTER — Encounter: Payer: Self-pay | Admitting: Obstetrics and Gynecology

## 2023-04-27 ENCOUNTER — Encounter: Payer: Self-pay | Admitting: Obstetrics and Gynecology

## 2023-05-12 ENCOUNTER — Encounter: Payer: Self-pay | Admitting: Obstetrics and Gynecology

## 2023-05-13 ENCOUNTER — Encounter: Payer: Self-pay | Admitting: Obstetrics and Gynecology

## 2023-05-24 ENCOUNTER — Encounter: Payer: Self-pay | Admitting: Obstetrics and Gynecology

## 2023-05-26 ENCOUNTER — Encounter: Payer: Self-pay | Admitting: Obstetrics and Gynecology

## 2023-05-27 ENCOUNTER — Telehealth: Payer: Self-pay | Admitting: Obstetrics and Gynecology

## 2023-05-27 NOTE — Telephone Encounter (Signed)
 Patient is on Gemtesa  75mg  daily at this time for her OAB and IC symptoms including bladder spasms. With her current immunotherapy regimen and drug trial status, we did not want to give anticholinergic medications as she currently takes PRN benadryl and Sudafed. The cumulative effect of anticholinergic medications has been shown to cause memory impairment and brain fog.We deferred Myrbetriq due to her hx of arterial hypotension and the lack of differentiation of Beta cells with Myrbetriq. Gemtesa  was the safest and most effective choice for patient with the lowest side effect profile and she confirmed with her [facility] physicians this would not interfere with the drug trial she is currently involved in.

## 2023-05-27 NOTE — Progress Notes (Signed)
 Submitted PA for TANESHA ARAMBULA is a 63 y.o. female  on Cover my Meds. KeyJudieth Nova -  PA Case ID: [id number]  Rx #: [id number]   Received instant approval: Case TK:[id number] Status: Approved or Denied Prior Auth: Coverage Start Date:05/13/2023 - Coverage End Date:02/01/2098

## 2023-05-28 ENCOUNTER — Ambulatory Visit

## 2023-05-28 VITALS — BP 96/62 | HR 82

## 2023-05-28 DIAGNOSIS — R3989 Other symptoms and signs involving the genitourinary system: Secondary | ICD-10-CM | POA: Diagnosis not present

## 2023-05-28 DIAGNOSIS — R350 Frequency of micturition: Secondary | ICD-10-CM

## 2023-05-28 LAB — POCT URINALYSIS DIPSTICK
Bilirubin, UA: NEGATIVE
Glucose, UA: NEGATIVE
Ketones, UA: NEGATIVE
Leukocytes, UA: NEGATIVE
Nitrite, UA: NEGATIVE
Protein, UA: NEGATIVE
Spec Grav, UA: 1.01 (ref 1.010–1.025)
Urobilinogen, UA: 0.2 U/dL
pH, UA: 5.5 (ref 5.0–8.0)

## 2023-05-28 MED ORDER — HEPARIN SODIUM (PORCINE) 10000 UNIT/ML IJ SOLN
10000.0000 [IU] | Freq: Once | INTRAMUSCULAR | Status: AC
  Administered 2023-05-28: 10000 [IU] via INTRAVESICAL

## 2023-05-28 MED ORDER — SODIUM BICARBONATE 8.4 % IV SOLN
5.0000 mL | Freq: Once | INTRAVENOUS | Status: AC
  Administered 2023-05-28: 5 mL

## 2023-05-28 MED ORDER — BUPIVACAINE HCL 0.25 % IJ SOLN
20.0000 mL | Freq: Once | INTRAMUSCULAR | Status: AC
Start: 2023-05-28 — End: 2023-05-28
  Administered 2023-05-28: 20 mL

## 2023-05-28 MED ORDER — LIDOCAINE HCL 2 % IJ SOLN
20.0000 mL | Freq: Once | INTRAMUSCULAR | Status: AC
  Administered 2023-05-28: 400 mg

## 2023-05-28 NOTE — Patient Instructions (Signed)
 Please keep all future appointments and if you have any questions or concerns please feel free to contact our office at [phone].

## 2023-05-28 NOTE — Progress Notes (Addendum)
 Bladder Instillation: The patient was identified and verbally consented for the procedure.  The urethra was prepped with Betadine x 3. A 16 Fr foley catheter was inserted the bladder and drained for 290 cc. The foley was then attached to a 60 mL syringe with the plunger removed.  The medication was slowly poured into the bladder via the syringe and foley.  The medication consisted of: 20ml of Lidocaine  2%, 20mL of Bupivicaine 0.25%, 10,000 units/mL Heparin , 5mL Sodium Bicarbonate  8.4%.  The foley was removed and the patient was asked to hold the liquids in her bladder for 30-60 minutes if possible.   Precautions were given and patient was instructed to call the office or on-call number for any concerns.  Marcelline Sermons, CMA

## 2023-05-28 NOTE — Addendum Note (Signed)
 Addended by: Tesla Keeler N on: 05/28/2023 09:11 AM   Modules accepted: Orders

## 2023-05-31 ENCOUNTER — Encounter: Payer: Self-pay | Admitting: Obstetrics and Gynecology

## 2023-06-03 NOTE — Telephone Encounter (Signed)
 Patient has been approved for Gemtesa .

## 2023-06-10 IMAGING — DX DG CHEST 1V PORT
1 series · 1 of 1 positions shown · non-contrast
Comparison: CT from earlier the same day, and previous

CLINICAL DATA: Status post left lower lobe percutaneous lung
biopsy.

EXAM:
PORTABLE CHEST - 1 VIEW

[chest]
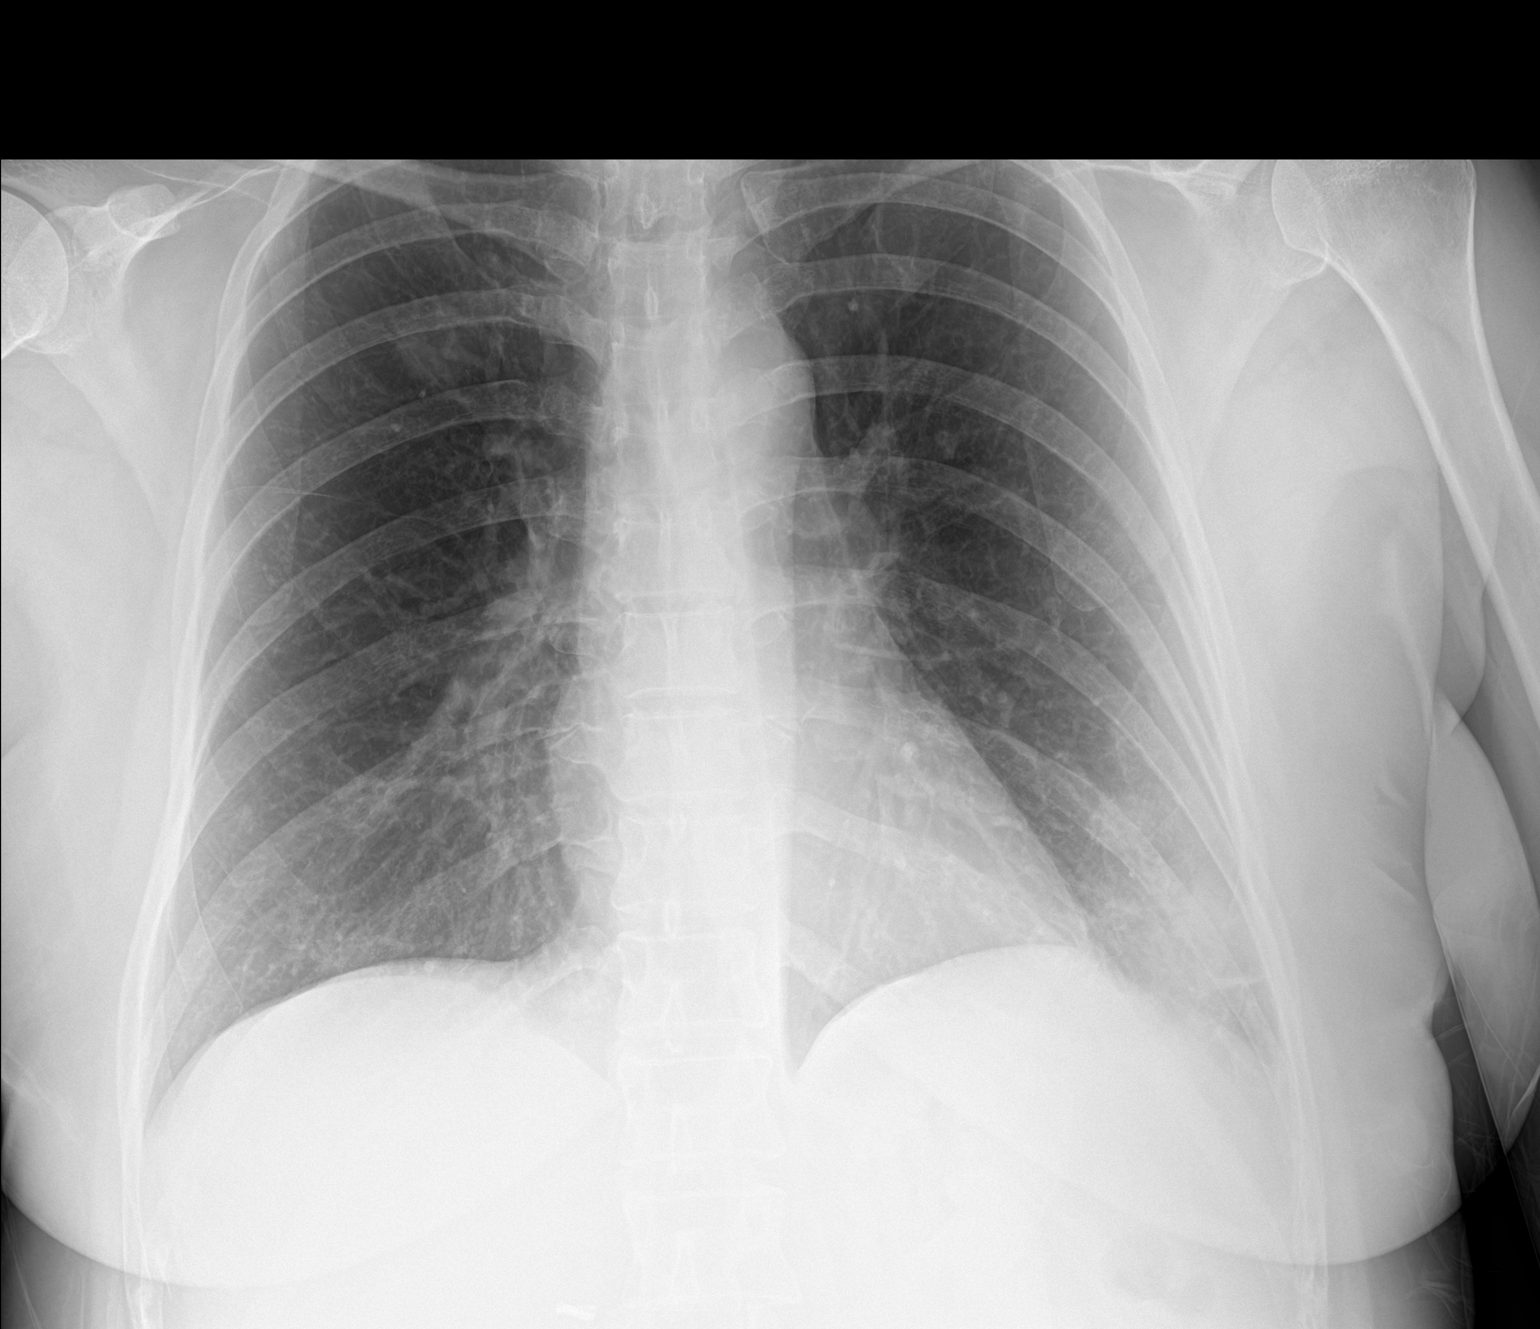

[1 of 1 positions shown; findings below may reference images not displayed]

FINDINGS: No pneumothorax. Poorly defined opacity laterally at the left lung
base corresponding to the nodule and adjacent alveolar hemorrhage
seen on earlier CT. Right lung nodules are again noted.

Heart size and mediastinal contours are within normal limits.

No effusion.

Visualized bones unremarkable.
IMPRESSION: No pneumothorax post lung biopsy.

## 2023-06-10 IMAGING — CT CT BIOPY CORE LUNG/MEDIASTINUM
1 of 2 series · 14 of 32 positions shown, 19 images · non-contrast
Comparison: none

CLINICAL DATA: multiple pulmonary nodules . No known primary

[Series 2: i-spiral 5.0 b40f · axial · 0.77mm/px · z∈[-121,+106]mm · 14 of 73 slices shown, 19 images]
[im 4/73  soft-tissue]
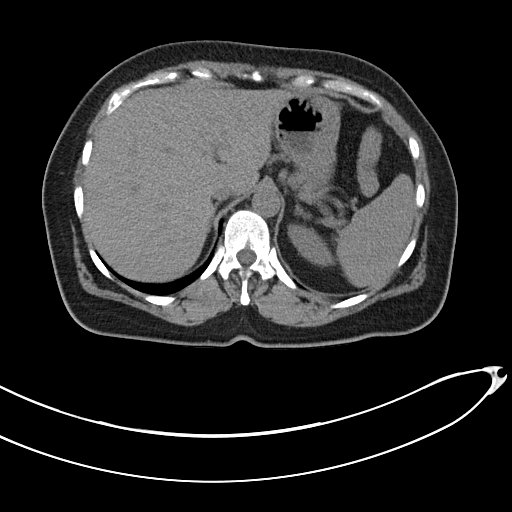
[im 4/73  bone]
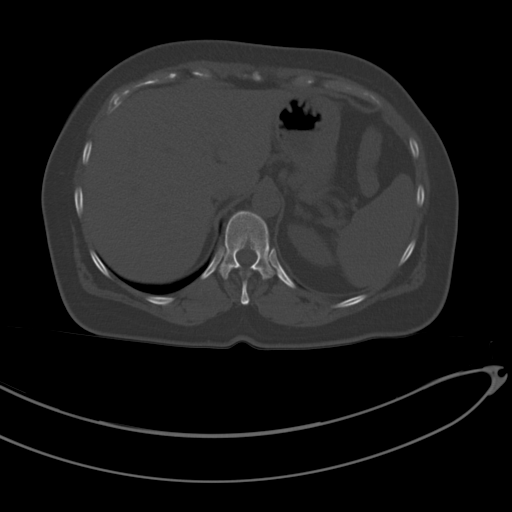
[im 12/73  soft-tissue]
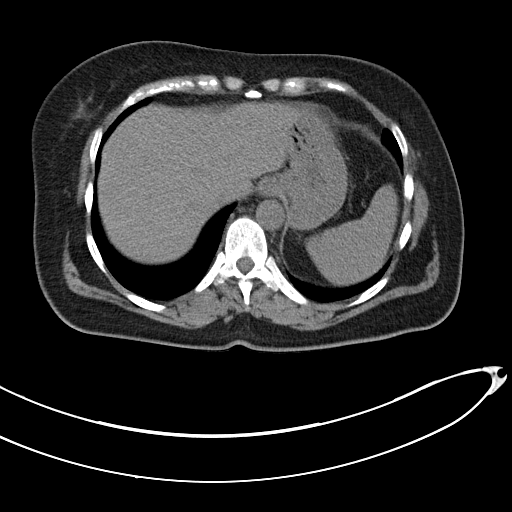
[im 16/73  soft-tissue]
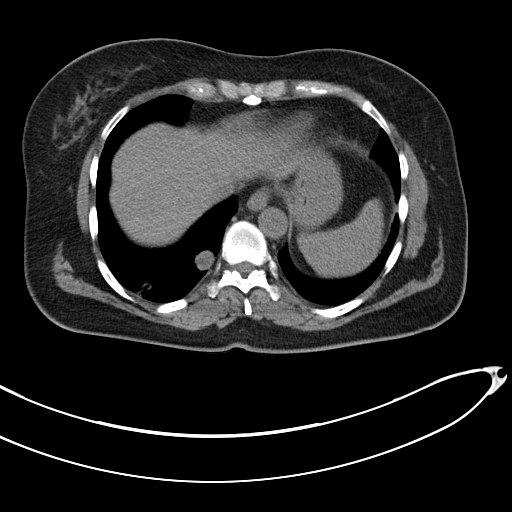
[im 19/73  soft-tissue]
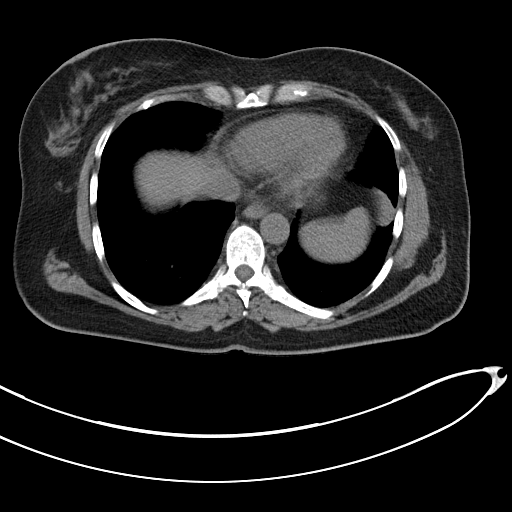
[im 27/73  soft-tissue]
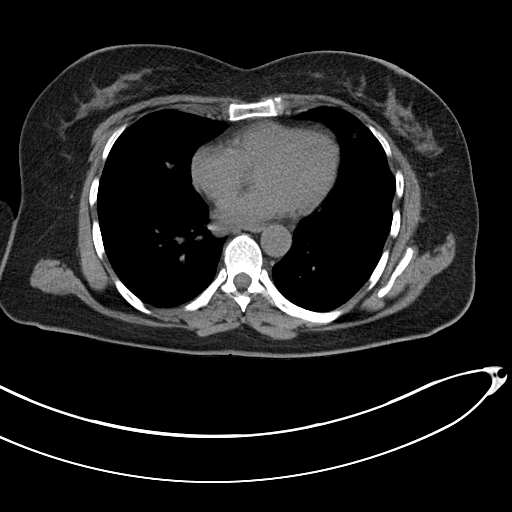
[im 31/73  soft-tissue]
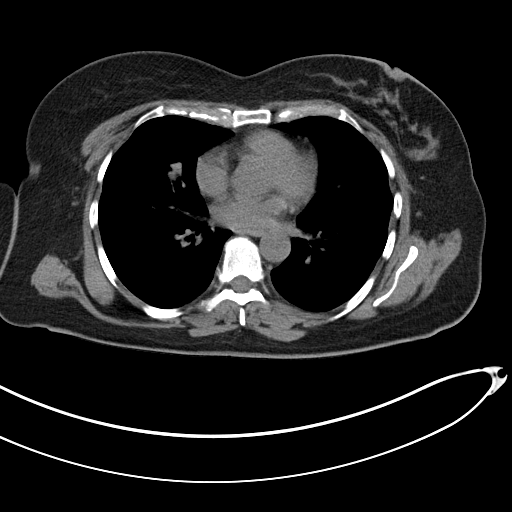
[im 38/73  soft-tissue]
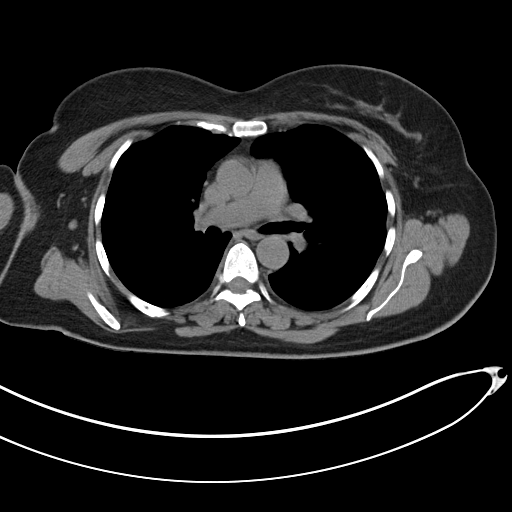
[im 42/73  soft-tissue]
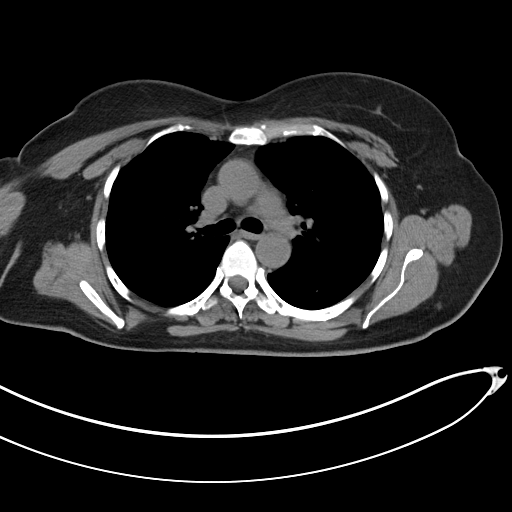
[im 46/73  soft-tissue]
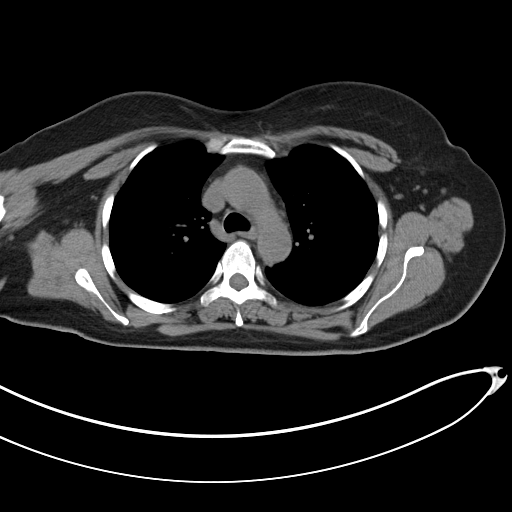
[im 46/73  bone]
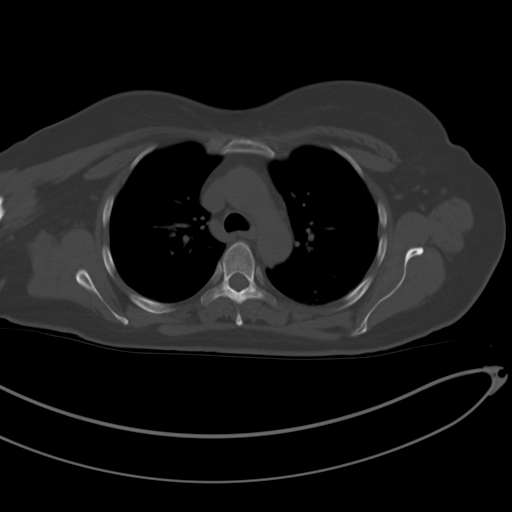
[im 54/73  soft-tissue]
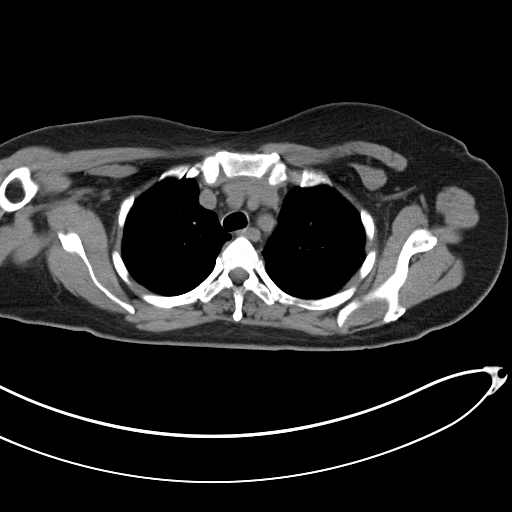
[im 57/73  soft-tissue]
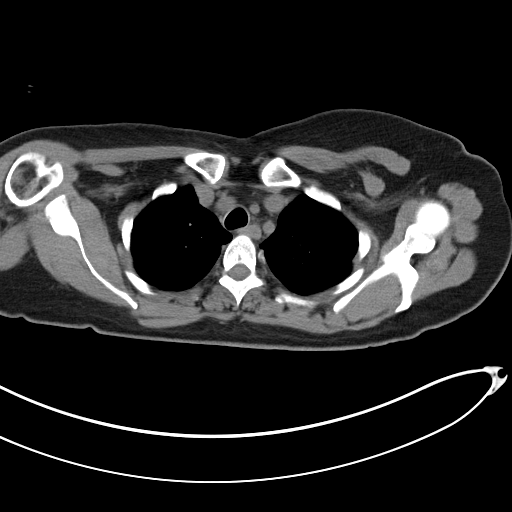
[im 57/73  lung]
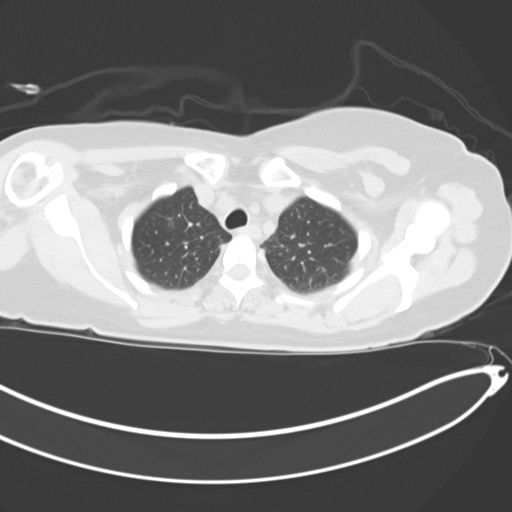
[im 61/73  soft-tissue]
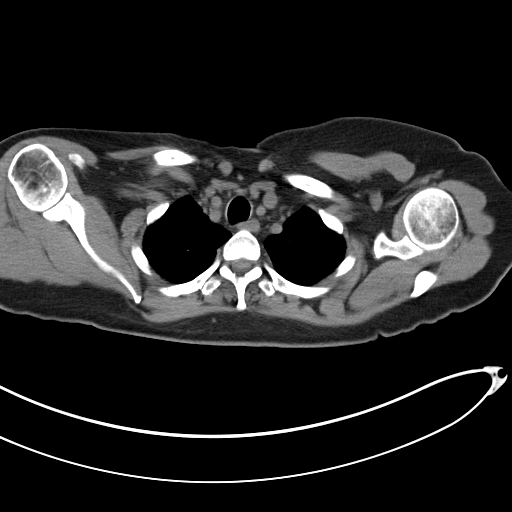
[im 61/73  lung]
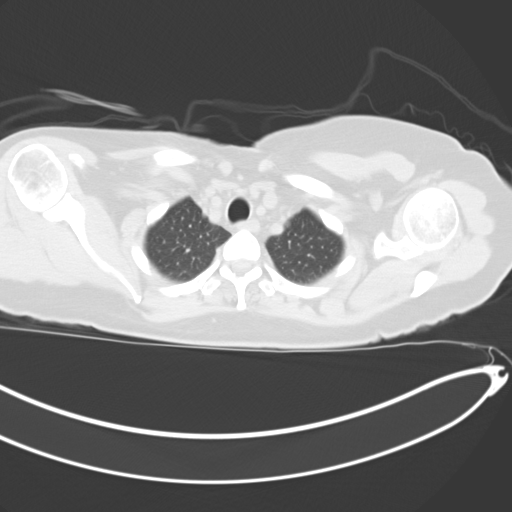
[im 65/73  lung]
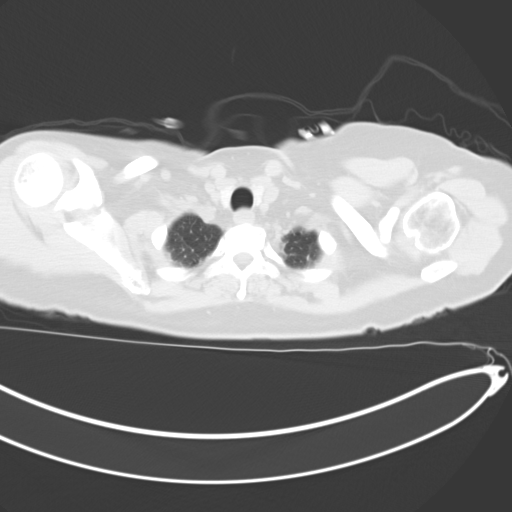
[im 69/73  soft-tissue]
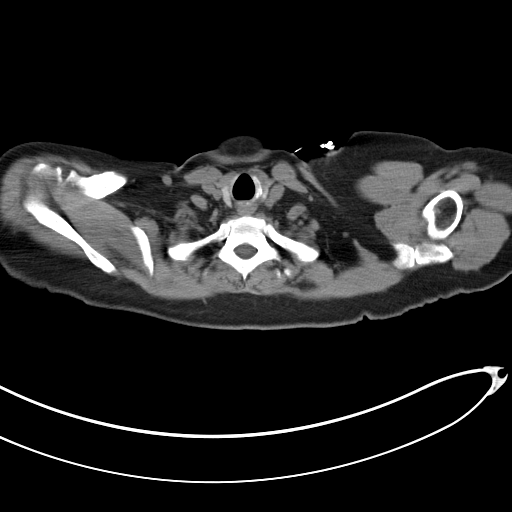
[im 69/73  lung]
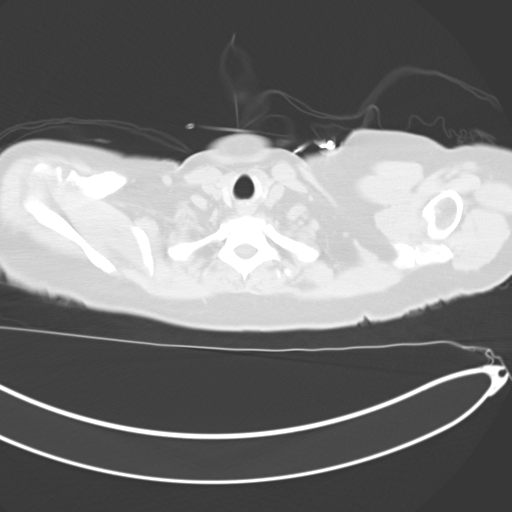

[14 of 32 positions shown; findings below may reference images not displayed]

EXAM:
CT GUIDED CORE BIOPSY OF LUNG

ANESTHESIA/SEDATION:
Intravenous Fentanyl 66mcg and Versed 2mg were administered as
conscious sedation during continuous monitoring of the patient's
level of consciousness and physiological / cardiorespiratory status
by the radiology RN, with a total moderate sedation time of 18
minutes.

PROCEDURE:
The procedure risks, benefits, and alternatives were explained to
the patient. Questions regarding the procedure were encouraged and
answered. The patient understands and consents to the procedure.

Select axial scans through the thorax were obtained. The target left
lower lobe lesion was localized. An appropriate skin site was
determined and marked.

The operative field was prepped with chlorhexidinein a sterile
fashion, and a sterile drape was applied covering the operative
field. A sterile gown and sterile gloves were used for the
procedure. Local anesthesia was provided with 1% Lidocaine.

Under CT fluoroscopic guidance, a 17 gauge trocar needle was
advanced to the margin of the lesion. Once needle tip position was
confirmed, coaxial 18-gauge core biopsy samples were obtained,
submitted in formalin to surgical pathology. The guide needle was
removed. Postprocedure scans show regional alveolar hemorrhage. No
pneumothorax. The patient remained asymptomatic.

COMPLICATIONS:
None immediate
FINDINGS: Multiple pulmonary lesions were again localized. CT guided core
biopsy samples were obtained of the representative pleural based
left lower lobe nodule.
IMPRESSION: 1. Technically successful CT-guided core biopsy, left lower lobe
pulmonary nodule.

## 2023-06-25 ENCOUNTER — Ambulatory Visit (INDEPENDENT_AMBULATORY_CARE_PROVIDER_SITE_OTHER)

## 2023-06-25 VITALS — BP 94/61 | HR 80

## 2023-06-25 DIAGNOSIS — R3989 Other symptoms and signs involving the genitourinary system: Secondary | ICD-10-CM

## 2023-06-25 LAB — POCT URINALYSIS DIP (CLINITEK)
Bilirubin, UA: NEGATIVE
Glucose, UA: NEGATIVE mg/dL
Ketones, POC UA: NEGATIVE mg/dL
Nitrite, UA: NEGATIVE
POC PROTEIN,UA: NEGATIVE
Spec Grav, UA: <=1.005 — AB (ref 1.010–1.025)
Urobilinogen, UA: 0.2 U/dL
pH, UA: 6 (ref 5.0–8.0)

## 2023-06-25 MED ORDER — NITROFURANTOIN MONOHYD MACRO 100 MG PO CAPS: 100.0000 mg | ORAL_CAPSULE | Freq: Two times a day (BID) | ORAL | 0 refills | Status: AC

## 2023-06-25 MED ORDER — NITROFURANTOIN MONOHYD MACRO 100 MG PO CAPS: 100.0000 mg | ORAL_CAPSULE | Freq: Two times a day (BID) | ORAL | 0 refills | Status: DC

## 2023-06-25 NOTE — Progress Notes (Addendum)
 Phyllis Morgan is a 63 y.o. female came to today for a bladder instillation. Upon checking her in she indicated she was having pain with urination and an increase of frequency. We did a poc UA. This was positive, we cancelled the bladder installation. [facility] sent [facility], per patient request the order has been corrected and cancelled and sent to [facility] [address] in [place].

## 2023-06-25 NOTE — Patient Instructions (Signed)
 Keep your next scheduled follow ups.   We have sent Macrobid  100mg  BID to [facility] in [place]  It was a pleasure to see you today!  Thank you for trusting me with your care!

## 2023-07-02 ENCOUNTER — Encounter: Payer: Self-pay | Admitting: Obstetrics and Gynecology

## 2023-07-02 ENCOUNTER — Ambulatory Visit (INDEPENDENT_AMBULATORY_CARE_PROVIDER_SITE_OTHER)

## 2023-07-02 VITALS — BP 110/52 | HR 75

## 2023-07-02 DIAGNOSIS — R3989 Other symptoms and signs involving the genitourinary system: Secondary | ICD-10-CM

## 2023-07-02 DIAGNOSIS — R300 Dysuria: Secondary | ICD-10-CM

## 2023-07-02 LAB — POCT URINALYSIS DIP (CLINITEK)
Bilirubin, UA: NEGATIVE
Blood, UA: NEGATIVE
Glucose, UA: NEGATIVE mg/dL
Ketones, POC UA: NEGATIVE mg/dL
Leukocytes, UA: NEGATIVE
Nitrite, UA: NEGATIVE
POC PROTEIN,UA: NEGATIVE
Spec Grav, UA: 1.025 (ref 1.010–1.025)
Urobilinogen, UA: 0.2 U/dL
pH, UA: 5.5 (ref 5.0–8.0)

## 2023-07-02 MED ORDER — HEPARIN SODIUM (PORCINE) 10000 UNIT/ML IJ SOLN
10000.0000 [IU] | Freq: Once | INTRAMUSCULAR | Status: AC
  Administered 2023-07-02: 10000 [IU] via INTRAVESICAL

## 2023-07-02 MED ORDER — LIDOCAINE HCL 2 % IJ SOLN
20.0000 mL | Freq: Once | INTRAMUSCULAR | Status: AC
  Administered 2023-07-02: 400 mg

## 2023-07-02 MED ORDER — BUPIVACAINE HCL 0.25 % IJ SOLN
20.0000 mL | Freq: Once | INTRAMUSCULAR | Status: AC
Start: 2023-07-02 — End: 2023-07-02
  Administered 2023-07-02: 20 mL

## 2023-07-02 MED ORDER — SODIUM BICARBONATE 8.4 % IV SOLN
5.0000 mL | Freq: Once | INTRAVENOUS | Status: AC
Start: 2023-07-02 — End: 2023-07-02
  Administered 2023-07-02: 5 mL

## 2023-07-02 NOTE — Progress Notes (Signed)
 Bladder Instillation: The patient was identified and verbally consented for the procedure.  The urethra was prepped with Betadine x 3. A 16 Fr foley catheter was inserted the bladder and drained for 175 cc. The foley was then attached to a 60 mL syringe with the plunger removed.  The medication was slowly poured into the bladder via the syringe and foley.  The medication consisted of: 20ml of Lidocaine  2%, 20mL of Bupivicaine 0.25%, 10,000 units/mL Heparin , 5mL Sodium Bicarbonate  8.4%.  The foley was removed and the patient was asked to hold the liquids in her bladder for 30-60 minutes if possible.   Precautions were given and patient was instructed to call the office or on-call number for any concerns.  Graciela Lava, CMA

## 2023-07-02 NOTE — Patient Instructions (Signed)
 Please keep all scheduled follow ups.  It was a pleasure to see you today!  Thank you for trusting me with your care!

## 2023-07-16 ENCOUNTER — Encounter: Payer: Self-pay | Admitting: Obstetrics and Gynecology

## 2023-07-19 NOTE — Telephone Encounter (Signed)
 Last AEX 07/13/22, BS  Dr. Colvin Dec -please review and advise if ok to proceed with scheduling consult.   Cc: Dr. Tia Flowers

## 2023-07-21 ENCOUNTER — Ambulatory Visit (INDEPENDENT_AMBULATORY_CARE_PROVIDER_SITE_OTHER)

## 2023-07-21 VITALS — BP 111/72 | HR 80

## 2023-07-21 DIAGNOSIS — R3989 Other symptoms and signs involving the genitourinary system: Secondary | ICD-10-CM | POA: Diagnosis not present

## 2023-07-21 LAB — POCT URINALYSIS DIP (CLINITEK)
Bilirubin, UA: NEGATIVE
Blood, UA: NEGATIVE
Glucose, UA: NEGATIVE mg/dL
Ketones, POC UA: NEGATIVE mg/dL
Leukocytes, UA: NEGATIVE
Nitrite, UA: NEGATIVE
POC PROTEIN,UA: NEGATIVE
Spec Grav, UA: 1.01 (ref 1.010–1.025)
Urobilinogen, UA: 0.2 U/dL
pH, UA: 7 (ref 5.0–8.0)

## 2023-07-21 MED ORDER — LIDOCAINE HCL 2 % IJ SOLN
20.0000 mL | Freq: Once | INTRAMUSCULAR | Status: AC
  Administered 2023-07-21: 400 mg

## 2023-07-21 MED ORDER — SODIUM BICARBONATE 8.4 % IV SOLN
5.0000 mL | Freq: Once | INTRAVENOUS | Status: AC
  Administered 2023-07-21: 5 mL

## 2023-07-21 MED ORDER — HEPARIN SODIUM (PORCINE) 10000 UNIT/ML IJ SOLN
10000.0000 [IU] | Freq: Once | INTRAMUSCULAR | Status: AC
  Administered 2023-07-21: 10000 [IU] via INTRAVESICAL

## 2023-07-21 MED ORDER — BUPIVACAINE HCL 0.25 % IJ SOLN
20.0000 mL | Freq: Once | INTRAMUSCULAR | Status: AC
Start: 2023-07-21 — End: 2023-07-21
  Administered 2023-07-21: 20 mL

## 2023-07-21 NOTE — Patient Instructions (Signed)
 Please keep all scheduled appointments.   It was a pleasure to see you today!  Thank you for trusting me with your care!

## 2023-07-21 NOTE — Progress Notes (Signed)
 Bladder Instillation: The patient was identified and verbally consented for the procedure.  The urethra was prepped with Betadine. A 16 Fr foley catheter was inserted the bladder and drained for 200 cc. The foley was then attached to a 50 mL syringe with the plunger removed.  The medication was slowly poured into the bladder via the syringe and foley.  The medication consisted of: 20ml of Lidocaine  2%, 20mL of Bupivicaine 0.25%, 10,000 units/mL Heparin , 5mL Sodium Bicarbonate  8.4%.  The foley was removed and the patient was asked to hold the liquids in her bladder for 30-60 minutes if possible.   Precautions were given and patient was instructed to call the office or on-call number for any concerns.  Graciela Lava, CMA

## 2023-07-23 ENCOUNTER — Ambulatory Visit

## 2023-08-12 ENCOUNTER — Ambulatory Visit: Admitting: Obstetrics and Gynecology

## 2023-08-19 ENCOUNTER — Other Ambulatory Visit: Payer: Self-pay | Admitting: Obstetrics and Gynecology

## 2023-09-01 ENCOUNTER — Ambulatory Visit: Admitting: Obstetrics and Gynecology

## 2023-09-01 ENCOUNTER — Encounter: Payer: Self-pay | Admitting: Obstetrics and Gynecology

## 2023-09-01 VITALS — BP 90/60 | HR 88 | Wt 160.2 lb

## 2023-09-01 DIAGNOSIS — M810 Age-related osteoporosis without current pathological fracture: Secondary | ICD-10-CM | POA: Diagnosis not present

## 2023-09-01 NOTE — Progress Notes (Signed)
 Patient presents to review osteoporosis treatment Patient with need for and histoyr of long courses of steroids for [facility] Large decline in bone loss since 2020 was at -1.3 and now at -2.6 On immunotherapy for [facility] lymphoid granulomatosis at [facility]. The provider has approved use with bone support medications.  She has had lesions in the brain and lungs and spine.  Her gait is unstable and has a high risk of fall from the the nerve compression for the [facility] lesion on the spine. She denies any fractures She cannot take fosamax with her reflex ( on meds and still uncontrolled and it is even worse when she takes course of prednisone) No history of stroke or MI in the past year Good kidney function She wants to begin bone support treatment.  A/p advanced osteoporosis with risk of fracture due to ongoing need for steroid treatment  All options reviewed. Patient is not a candidate for fosamax and needs a bone builder.  Counseled on evenity for 15% bone building in a year and reduce her fracture risk ina short time followed by prolia.  Discussed prolia will slowly build and blocks resorption at the same time, and is best followed by treatment with Evenity.  If she decides to change from prolia, then she will need an alternative medication to prevent rebound fracture.  The r/b/a/I of both medications were discussed.  She would like to start PA for the medications. Brochures provided. She will again review with [facility] provider on the medications . 30 minutes spent on reviewing records, imaging,  and one on one patient time and counseling patient and documentation Dr. Glennon

## 2023-09-02 ENCOUNTER — Encounter: Payer: Self-pay | Admitting: Obstetrics and Gynecology

## 2023-09-02 DIAGNOSIS — M810 Age-related osteoporosis without current pathological fracture: Secondary | ICD-10-CM

## 2023-09-06 ENCOUNTER — Other Ambulatory Visit: Payer: Self-pay | Admitting: *Deleted

## 2023-09-06 DIAGNOSIS — M810 Age-related osteoporosis without current pathological fracture: Secondary | ICD-10-CM

## 2023-09-06 MED ORDER — ROMOSOZUMAB-AQQG 105 MG/1.17ML ~~LOC~~ SOSY
210.0000 mg | PREFILLED_SYRINGE | Freq: Once | SUBCUTANEOUS | Status: AC
Start: 2023-09-20 — End: ?

## 2023-09-09 ENCOUNTER — Encounter: Payer: Self-pay | Admitting: Obstetrics and Gynecology

## 2023-09-09 ENCOUNTER — Other Ambulatory Visit: Payer: Self-pay

## 2023-09-09 NOTE — Addendum Note (Signed)
 Addended by: VICCI ARVIN PARAS on: 09/09/2023 10:45 AM   Modules accepted: Orders

## 2023-09-09 NOTE — Addendum Note (Signed)
 Addended by: VICCI ARVIN PARAS on: 09/09/2023 09:46 AM   Modules accepted: Orders

## 2023-09-09 NOTE — Telephone Encounter (Signed)
 [facility] [address] in [place]  Fax: [phone]

## 2023-09-10 NOTE — Telephone Encounter (Signed)
Routing to [employer].   Encounter closed

## 2023-09-23 ENCOUNTER — Encounter: Payer: Self-pay | Admitting: Obstetrics and Gynecology

## 2023-10-06 ENCOUNTER — Encounter: Payer: Self-pay | Admitting: *Deleted

## 2023-10-18 NOTE — Telephone Encounter (Signed)
 Call returned to patient. Advised patient message received, Damien is back in office Thursday. Patient would like to schedule Evenity  for next week, she is on immunotherapy and is trying to schedule with time between injections.   Routing to [employer]

## 2023-10-29 NOTE — Telephone Encounter (Signed)
 Call returned to patient. Patient calling for update. Last note reviewed. Advised I will forward to Damien to f/u on Monday. Patient agreeable and appreciative of call.   Routing to [employer] for call back

## 2023-11-01 ENCOUNTER — Other Ambulatory Visit: Payer: Self-pay | Admitting: *Deleted

## 2023-11-01 DIAGNOSIS — M810 Age-related osteoporosis without current pathological fracture: Secondary | ICD-10-CM

## 2023-11-01 MED ORDER — ROMOSOZUMAB-AQQG 105 MG/1.17ML ~~LOC~~ SOSY: 210.0000 mg | PREFILLED_SYRINGE | SUBCUTANEOUS | Status: AC

## 2023-11-02 ENCOUNTER — Ambulatory Visit (INDEPENDENT_AMBULATORY_CARE_PROVIDER_SITE_OTHER)

## 2023-11-02 ENCOUNTER — Other Ambulatory Visit: Payer: Self-pay | Admitting: *Deleted

## 2023-11-02 DIAGNOSIS — M810 Age-related osteoporosis without current pathological fracture: Secondary | ICD-10-CM

## 2023-11-02 MED ORDER — ROMOSOZUMAB-AQQG 105 MG/1.17ML ~~LOC~~ SOSY
210.0000 mg | PREFILLED_SYRINGE | SUBCUTANEOUS | Status: AC
  Administered 2023-12-06 – 2024-01-10 (×2): 210 mg via SUBCUTANEOUS

## 2023-11-02 MED ORDER — ROMOSOZUMAB-AQQG 105 MG/1.17ML ~~LOC~~ SOSY: 210.0000 mg | PREFILLED_SYRINGE | SUBCUTANEOUS | Status: DC

## 2023-11-02 NOTE — Progress Notes (Signed)
 11/02/23 - First round of Evenity  - Pt given one injection in each arm, per her request. Pt tolerated both injections well. (IC, CCMA)

## 2023-11-22 ENCOUNTER — Other Ambulatory Visit: Payer: Self-pay | Admitting: Obstetrics and Gynecology

## 2023-11-22 DIAGNOSIS — R300 Dysuria: Secondary | ICD-10-CM

## 2023-12-06 ENCOUNTER — Ambulatory Visit

## 2023-12-06 DIAGNOSIS — M810 Age-related osteoporosis without current pathological fracture: Secondary | ICD-10-CM | POA: Diagnosis not present

## 2024-01-10 ENCOUNTER — Ambulatory Visit (INDEPENDENT_AMBULATORY_CARE_PROVIDER_SITE_OTHER)

## 2024-01-10 DIAGNOSIS — M810 Age-related osteoporosis without current pathological fracture: Secondary | ICD-10-CM

## 2024-01-10 NOTE — Progress Notes (Signed)
 Evenity  #3 - One injection given (SQ) in each arm, per Pt's request.  (IC, CCMA)

## 2024-02-09 ENCOUNTER — Other Ambulatory Visit: Payer: Self-pay | Admitting: *Deleted

## 2024-02-09 DIAGNOSIS — M810 Age-related osteoporosis without current pathological fracture: Secondary | ICD-10-CM

## 2024-02-09 MED ORDER — ROMOSOZUMAB-AQQG 105 MG/1.17ML ~~LOC~~ SOSY
210.0000 mg | PREFILLED_SYRINGE | SUBCUTANEOUS | Status: AC
  Administered 2024-02-14: 210 mg via SUBCUTANEOUS

## 2024-02-10 ENCOUNTER — Other Ambulatory Visit: Payer: Self-pay | Admitting: Neurosurgery

## 2024-02-10 DIAGNOSIS — M5440 Lumbago with sciatica, unspecified side: Secondary | ICD-10-CM

## 2024-02-11 ENCOUNTER — Other Ambulatory Visit (HOSPITAL_COMMUNITY): Payer: Self-pay

## 2024-02-11 ENCOUNTER — Telehealth: Payer: Self-pay

## 2024-02-11 NOTE — Telephone Encounter (Signed)
 Evenity  VOB initiated via [email or web address]  Last OV:  Next OV:  Last Evenity  inj: 01/10/24 Next Evenity  inj DUE: NOW

## 2024-02-14 ENCOUNTER — Ambulatory Visit (INDEPENDENT_AMBULATORY_CARE_PROVIDER_SITE_OTHER)

## 2024-02-14 ENCOUNTER — Encounter: Payer: Self-pay | Admitting: Obstetrics and Gynecology

## 2024-02-14 ENCOUNTER — Encounter: Payer: Self-pay | Admitting: *Deleted

## 2024-02-14 ENCOUNTER — Other Ambulatory Visit: Payer: Self-pay | Admitting: Obstetrics and Gynecology

## 2024-02-14 DIAGNOSIS — M810 Age-related osteoporosis without current pathological fracture: Secondary | ICD-10-CM | POA: Diagnosis not present

## 2024-02-14 MED ORDER — GEMTESA 75 MG PO TABS: 1.0000 | ORAL_TABLET | Freq: Every day | ORAL | 0 refills | Status: AC

## 2024-02-14 NOTE — Progress Notes (Signed)
 02/14/24 - 4th Evenity  - One injection (SQ) in each arm, per Pt's request. (IC, CCMA)

## 2024-02-15 NOTE — Discharge Instructions (Signed)

## 2024-02-17 ENCOUNTER — Inpatient Hospital Stay: Admission: RE | Admit: 2024-02-17 | Discharge: 2024-02-17 | Attending: Neurosurgery | Admitting: Neurosurgery

## 2024-02-17 ENCOUNTER — Other Ambulatory Visit (HOSPITAL_COMMUNITY): Payer: Self-pay

## 2024-02-17 DIAGNOSIS — M5440 Lumbago with sciatica, unspecified side: Secondary | ICD-10-CM

## 2024-02-17 MED ORDER — IOPAMIDOL (ISOVUE-M 200) INJECTION 41%
20.0000 mL | Freq: Once | INTRAMUSCULAR | Status: AC
  Administered 2024-02-17: 20 mL via INTRATHECAL

## 2024-02-17 MED ORDER — MEPERIDINE HCL 50 MG/ML IJ SOLN: 50.0000 mg | Freq: Once | INTRAMUSCULAR | Status: DC | PRN

## 2024-02-17 MED ORDER — ONDANSETRON HCL 4 MG/2ML IJ SOLN: 4.0000 mg | Freq: Once | INTRAMUSCULAR | Status: DC | PRN

## 2024-02-17 MED ORDER — DIAZEPAM 5 MG PO TABS: 5.0000 mg | ORAL_TABLET | Freq: Once | ORAL | Status: DC

## 2024-02-17 NOTE — Telephone Encounter (Signed)
 SABRA

## 2024-02-17 NOTE — Telephone Encounter (Signed)
 Buy/Bill (Office supplied medication)  Out-of-pocket cost due at time of clinic visit: $94  Number of injection/visits approved: 12  Primary: [employer] Co-insurance: $94 Admin fee co-insurance: 0%  Secondary: --- Co-insurance:  Admin fee co-insurance:   Medical Benefit Details: Date Benefits were checked: 02/16/24 Deductible: NO/ Coinsurance: $94/ Admin Fee: 0%  Prior Auth: APPROVED PA# [id number] Expiration Date: 10/13/23-10/12/24  # of doses approved: 12 -----------------------------------------------------------------------  Patient IS eligible for Copay Card. Copay Card can make patient's cost as little as $25. Link to apply: [email or web address]  ** This summary of benefits is an estimation of the patient's out-of-pocket cost. Exact cost may very based on individual plan coverage.

## 2024-03-13 ENCOUNTER — Ambulatory Visit: Admitting: Obstetrics and Gynecology

## 2024-03-17 ENCOUNTER — Ambulatory Visit
